# Patient Record
Sex: Female | Born: 1985
Health system: Southern US, Community
[De-identification: ages and names within clinical notes are randomized; demographics above are authoritative.]

## PROBLEM LIST (undated history)

## (undated) ENCOUNTER — Inpatient Hospital Stay (HOSPITAL_COMMUNITY): Payer: Self-pay

## (undated) DIAGNOSIS — K0889 Other specified disorders of teeth and supporting structures: Secondary | ICD-10-CM

## (undated) DIAGNOSIS — E039 Hypothyroidism, unspecified: Secondary | ICD-10-CM

## (undated) DIAGNOSIS — K219 Gastro-esophageal reflux disease without esophagitis: Secondary | ICD-10-CM

## (undated) DIAGNOSIS — I10 Essential (primary) hypertension: Secondary | ICD-10-CM

## (undated) DIAGNOSIS — Z803 Family history of malignant neoplasm of breast: Secondary | ICD-10-CM

## (undated) DIAGNOSIS — F419 Anxiety disorder, unspecified: Secondary | ICD-10-CM

## (undated) DIAGNOSIS — L02429 Furuncle of limb, unspecified: Secondary | ICD-10-CM

## (undated) DIAGNOSIS — D649 Anemia, unspecified: Secondary | ICD-10-CM

## (undated) DIAGNOSIS — Z8481 Family history of carrier of genetic disease: Secondary | ICD-10-CM

## (undated) DIAGNOSIS — R51 Headache: Secondary | ICD-10-CM

## (undated) DIAGNOSIS — R519 Headache, unspecified: Secondary | ICD-10-CM

## (undated) DIAGNOSIS — R87629 Unspecified abnormal cytological findings in specimens from vagina: Secondary | ICD-10-CM

## (undated) HISTORY — DX: Family history of malignant neoplasm of breast: Z80.3

## (undated) HISTORY — PX: WISDOM TOOTH EXTRACTION: SHX21

## (undated) HISTORY — DX: Anxiety disorder, unspecified: F41.9

## (undated) HISTORY — PX: CYST EXCISION: SHX5701

## (undated) HISTORY — DX: Furuncle of limb, unspecified: L02.429

## (undated) HISTORY — DX: Gastro-esophageal reflux disease without esophagitis: K21.9

## (undated) HISTORY — DX: Family history of carrier of genetic disease: Z84.81

## (undated) HISTORY — DX: Other specified disorders of teeth and supporting structures: K08.89

---
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
## 2006-06-23 ENCOUNTER — Emergency Department (HOSPITAL_COMMUNITY): Admission: EM | Admit: 2006-06-23 | Discharge: 2006-06-23 | Payer: Self-pay | Admitting: Family Medicine

## 2006-07-09 ENCOUNTER — Emergency Department (HOSPITAL_COMMUNITY): Admission: EM | Admit: 2006-07-09 | Discharge: 2006-07-09 | Payer: Self-pay | Admitting: Emergency Medicine

## 2006-07-26 ENCOUNTER — Emergency Department (HOSPITAL_COMMUNITY): Admission: EM | Admit: 2006-07-26 | Discharge: 2006-07-26 | Payer: Self-pay | Admitting: Emergency Medicine

## 2006-08-14 ENCOUNTER — Emergency Department (HOSPITAL_COMMUNITY): Admission: EM | Admit: 2006-08-14 | Discharge: 2006-08-14 | Payer: Self-pay | Admitting: Family Medicine

## 2006-08-27 ENCOUNTER — Ambulatory Visit (HOSPITAL_BASED_OUTPATIENT_CLINIC_OR_DEPARTMENT_OTHER): Admission: RE | Admit: 2006-08-27 | Discharge: 2006-08-27 | Payer: Self-pay | Admitting: General Surgery

## 2006-08-27 ENCOUNTER — Encounter (INDEPENDENT_AMBULATORY_CARE_PROVIDER_SITE_OTHER): Payer: Self-pay | Admitting: Specialist

## 2007-02-16 ENCOUNTER — Emergency Department (HOSPITAL_COMMUNITY): Admission: EM | Admit: 2007-02-16 | Discharge: 2007-02-16 | Payer: Self-pay | Admitting: Emergency Medicine

## 2007-06-17 ENCOUNTER — Emergency Department (HOSPITAL_COMMUNITY): Admission: EM | Admit: 2007-06-17 | Discharge: 2007-06-17 | Payer: Self-pay | Admitting: Emergency Medicine

## 2007-07-21 ENCOUNTER — Inpatient Hospital Stay (HOSPITAL_COMMUNITY): Admission: AD | Admit: 2007-07-21 | Discharge: 2007-07-21 | Payer: Self-pay | Admitting: Obstetrics & Gynecology

## 2007-08-12 ENCOUNTER — Inpatient Hospital Stay (HOSPITAL_COMMUNITY): Admission: AD | Admit: 2007-08-12 | Discharge: 2007-08-12 | Payer: Self-pay | Admitting: Obstetrics & Gynecology

## 2007-09-15 ENCOUNTER — Inpatient Hospital Stay (HOSPITAL_COMMUNITY): Admission: AD | Admit: 2007-09-15 | Discharge: 2007-09-15 | Payer: Self-pay | Admitting: Obstetrics & Gynecology

## 2007-11-28 ENCOUNTER — Inpatient Hospital Stay (HOSPITAL_COMMUNITY): Admission: AD | Admit: 2007-11-28 | Discharge: 2007-11-28 | Payer: Self-pay | Admitting: Obstetrics & Gynecology

## 2008-02-08 ENCOUNTER — Inpatient Hospital Stay (HOSPITAL_COMMUNITY): Admission: RE | Admit: 2008-02-08 | Discharge: 2008-02-11 | Payer: Self-pay | Admitting: Obstetrics

## 2008-02-08 ENCOUNTER — Encounter: Payer: Self-pay | Admitting: Obstetrics

## 2008-06-13 ENCOUNTER — Emergency Department (HOSPITAL_COMMUNITY): Admission: EM | Admit: 2008-06-13 | Discharge: 2008-06-13 | Payer: Self-pay | Admitting: Family Medicine

## 2008-06-25 ENCOUNTER — Emergency Department (HOSPITAL_COMMUNITY): Admission: EM | Admit: 2008-06-25 | Discharge: 2008-06-25 | Payer: Self-pay | Admitting: Emergency Medicine

## 2008-10-12 ENCOUNTER — Emergency Department (HOSPITAL_COMMUNITY): Admission: EM | Admit: 2008-10-12 | Discharge: 2008-10-12 | Payer: Self-pay | Admitting: Emergency Medicine

## 2008-12-07 ENCOUNTER — Emergency Department (HOSPITAL_COMMUNITY): Admission: EM | Admit: 2008-12-07 | Discharge: 2008-12-07 | Payer: Self-pay | Admitting: Emergency Medicine

## 2009-02-17 ENCOUNTER — Emergency Department (HOSPITAL_COMMUNITY): Admission: EM | Admit: 2009-02-17 | Discharge: 2009-02-17 | Payer: Self-pay | Admitting: Emergency Medicine

## 2009-03-12 ENCOUNTER — Emergency Department (HOSPITAL_COMMUNITY): Admission: EM | Admit: 2009-03-12 | Discharge: 2009-03-12 | Payer: Self-pay | Admitting: Emergency Medicine

## 2009-05-15 ENCOUNTER — Emergency Department (HOSPITAL_COMMUNITY): Admission: EM | Admit: 2009-05-15 | Discharge: 2009-05-15 | Payer: Self-pay | Admitting: Emergency Medicine

## 2009-07-26 ENCOUNTER — Emergency Department (HOSPITAL_COMMUNITY): Admission: EM | Admit: 2009-07-26 | Discharge: 2009-07-26 | Payer: Self-pay | Admitting: Family Medicine

## 2009-10-29 ENCOUNTER — Emergency Department (HOSPITAL_COMMUNITY): Admission: EM | Admit: 2009-10-29 | Discharge: 2009-10-29 | Payer: Self-pay | Admitting: Emergency Medicine

## 2010-03-14 ENCOUNTER — Ambulatory Visit (HOSPITAL_COMMUNITY): Admission: RE | Admit: 2010-03-14 | Discharge: 2010-03-14 | Payer: Self-pay | Admitting: Obstetrics

## 2010-04-20 ENCOUNTER — Emergency Department (HOSPITAL_COMMUNITY): Admission: EM | Admit: 2010-04-20 | Discharge: 2010-04-20 | Payer: Self-pay | Admitting: Emergency Medicine

## 2010-04-21 ENCOUNTER — Emergency Department (HOSPITAL_COMMUNITY): Admission: EM | Admit: 2010-04-21 | Discharge: 2010-04-21 | Payer: Self-pay | Admitting: Emergency Medicine

## 2010-04-24 ENCOUNTER — Emergency Department (HOSPITAL_COMMUNITY): Admission: EM | Admit: 2010-04-24 | Discharge: 2010-04-24 | Payer: Self-pay | Admitting: Surgery

## 2010-09-18 ENCOUNTER — Emergency Department (HOSPITAL_COMMUNITY)
Admission: EM | Admit: 2010-09-18 | Discharge: 2010-09-18 | Payer: Self-pay | Source: Home / Self Care | Admitting: Emergency Medicine

## 2010-10-01 ENCOUNTER — Emergency Department (HOSPITAL_COMMUNITY)
Admission: EM | Admit: 2010-10-01 | Discharge: 2010-10-01 | Payer: Self-pay | Source: Home / Self Care | Admitting: Emergency Medicine

## 2010-10-06 LAB — URINALYSIS, ROUTINE W REFLEX MICROSCOPIC
Ketones, ur: NEGATIVE mg/dL
Nitrite: NEGATIVE
Protein, ur: NEGATIVE mg/dL
Specific Gravity, Urine: 1.033 — ABNORMAL HIGH (ref 1.005–1.030)
Urine Glucose, Fasting: NEGATIVE mg/dL
Urobilinogen, UA: 1 mg/dL (ref 0.0–1.0)
pH: 6 (ref 5.0–8.0)

## 2010-10-06 LAB — URINE MICROSCOPIC-ADD ON

## 2010-10-06 LAB — RPR: RPR Ser Ql: NONREACTIVE

## 2010-10-06 LAB — POCT PREGNANCY, URINE: Preg Test, Ur: NEGATIVE

## 2010-10-06 LAB — WET PREP, GENITAL
Clue Cells Wet Prep HPF POC: NONE SEEN
Trich, Wet Prep: NONE SEEN
Yeast Wet Prep HPF POC: NONE SEEN

## 2010-10-06 LAB — GC/CHLAMYDIA PROBE AMP, GENITAL
Chlamydia, DNA Probe: NEGATIVE
GC Probe Amp, Genital: NEGATIVE

## 2010-12-24 LAB — POCT URINALYSIS DIP (DEVICE)
Bilirubin Urine: NEGATIVE
Glucose, UA: NEGATIVE mg/dL
Ketones, ur: NEGATIVE mg/dL
Nitrite: NEGATIVE
Protein, ur: NEGATIVE mg/dL
Specific Gravity, Urine: 1.01 (ref 1.005–1.030)
Urobilinogen, UA: 0.2 mg/dL (ref 0.0–1.0)
pH: 7 (ref 5.0–8.0)

## 2010-12-29 LAB — URINALYSIS, ROUTINE W REFLEX MICROSCOPIC
Bilirubin Urine: NEGATIVE
Glucose, UA: NEGATIVE mg/dL
Hgb urine dipstick: NEGATIVE
Ketones, ur: NEGATIVE mg/dL
Nitrite: NEGATIVE
Protein, ur: NEGATIVE mg/dL
Specific Gravity, Urine: 1.035 — ABNORMAL HIGH (ref 1.005–1.030)
Urobilinogen, UA: 1 mg/dL (ref 0.0–1.0)
pH: 6 (ref 5.0–8.0)

## 2010-12-29 LAB — URINE MICROSCOPIC-ADD ON

## 2010-12-29 LAB — POCT PREGNANCY, URINE: Preg Test, Ur: NEGATIVE

## 2011-01-01 LAB — POCT PREGNANCY, URINE: Preg Test, Ur: NEGATIVE

## 2011-01-05 LAB — STREP A DNA PROBE: Group A Strep Probe: NEGATIVE

## 2011-01-05 LAB — RAPID STREP SCREEN (MED CTR MEBANE ONLY): Streptococcus, Group A Screen (Direct): NEGATIVE

## 2011-02-03 NOTE — Op Note (Signed)
NAMEJOSSILYN, Marisa Yu               ACCOUNT NO.:  [id number]   MEDICAL RECORD NO.:  [id number]          PATIENT TYPE:  INP   LOCATION:  9133                          FACILITY:  [facility]   PHYSICIAN:  Charles A. Clearance Coots, M.D.DATE OF BIRTH:  Nov 18, 1985   DATE OF PROCEDURE:  02/08/2008  DATE OF DISCHARGE:                               OPERATIVE REPORT   PREOPERATIVE DIAGNOSIS:  A 40 weeks' gestation, previous cesarean  section, desires repeat cesarean section.   POSTOPERATIVE DIAGNOSIS:  A 40 weeks' gestation, previous cesarean  section, desires repeat cesarean section.   PROCEDURE:  Repeat low-transverse cesarean section.   SURGEON:  Brock Bad, MD   ASSISTANT:  Dian Queen, OR technician.   ANESTHESIA:  Spinal.   ESTIMATED BLOOD LOSS:  800 mL.   IV FLUIDS:  2800 mL.   URINE OUTPUT:  125 mL, clear.   COMPLICATIONS:  None.   DRAINS:  Foley to gravity.   FINDINGS:  Viable female at 1733, Apgars of 9 at 1 minute and 9 at 5  minutes, weight of 7 pounds and 15 ounces.  Normal uterus, ovaries, and  fallopian tubes.   OPERATION:  The patient was brought to the operating room; and after a  satisfactory spinal anesthesia, the abdomen was prepped and draped in  usual sterile fashion.  A Pfannenstiel skin incision was made through  the previous scar down to the fascia with the scalpel.  Fascia was  nicked in the midline, and the fascial incision was extended to the left  and to the right with curved Mayo scissors.  The superior and inferior  fascial edges were taken off the rectus muscles with blunt and sharp  dissection.  Rectus muscle was bluntly divided in the midline, and the  peritoneum was entered digitally and was digitally extended to the left  and to the right.  The Lexus retractor was then placed in the incision.  The vesicouterine fold of peritoneum, above the reflection of the  urinary bladder, was grasped with forceps and was incised and undermined  with Metzenbaum  scissors.  The incision was extended to the left and to  the right with the Metzenbaum scissors.  The bladder flap was bluntly  developed, and the uterus was then entered transversely in the lower  uterine segment with the scalpel down to the amniotic sac.  The uterine  incision was then extended to the left and to the right with the bandage  scissors.  The amniotic sac was ruptured.  The vertex was then brought  up into the incision, and the occiput was rotated into the incision, but  the vertex was noted to be hyperextended and delivery was difficult  without assistance.  The mighty vacuum extractor was placed on the  occiput.  The occiput was then flexed further into the incision and  delivered by vacuum extraction with the aid of fundal pressure from the  assistant.  The infant's mouth and nose were suctioned with a suction  bulb, and the delivery was completed with the aid of fundal pressure  from the assistant.  Umbilical cord was  doubly clamped and cut, and the  infant was handed off to the nursery staff.  Cord blood was obtained,  and the placenta was spontaneously expelled from the uterine cavity  intact.  The endometrial surface was then thoroughly debrided with a dry  lap sponge.  The edges of the uterine incision were grasped with a ring  forceps, and the uterus was closed with continuous interlocking suture  of 0 Monocryl from each corner to the center.  Hemostasis was excellent.  The pelvic cavity was then thoroughly irrigated with warm saline  solution, and the abdomen was then closed as follows.  Peritoneum was  closed with a continuous suture of 2-0 Monocryl.  The fascia was closed  with a continuous suture of 0 Vicryl.  Subcutaneous tissue was  thoroughly irrigated with warm saline solution, and all areas of  subcutaneous bleeding were coagulated with the Bovie.  Skin was then  closed with a surgical stainless steel staples.  A sterile bandage was  applied to the incision  closure.  Surgical technician indicated that all  needle, sponge, and instrument counts were correct x2.  The patient  tolerated the procedure well, was transported to the recovery room in  satisfactory condition.      Charles A. Clearance Coots, M.D.  Electronically Signed     CAH/MEDQ  D:  02/08/2008  T:  02/09/2008  Job:  [id number]

## 2011-02-03 NOTE — H&P (Signed)
Marisa Yu, CRONIC               ACCOUNT NO.:  [id number]   MEDICAL RECORD NO.:  [id number]          PATIENT TYPE:  INP   LOCATION:  9169                          [facility]ILITY[facility]  [facility]   PHYSICIAN:  Roseanna Rainbow, M.D.DATE OF BIRTH:  [birth year]   DATE OF ADMISSION:  02/08/2008  DATE OF DISCHARGE:                              HISTORY & PHYSICAL   CHIEF COMPLAINT:  The patient is a 25 year old para 1 with an estimated  date of confinement of Feb 07, 2008, with an intrauterine pregnancy at  40 plus weeks with a history of a previous cesarean delivery and  symptoms of a possible genital herpes outbreak.  At this point, she  declines a trial of labor.   HISTORY OF PRESENT ILLNESS:  Please see the above.  The patient was  initially scheduled for an induction of labor today.  She declines any  induction attempts at this time.   ALLERGIES:  No known drug allergies.   MEDICATIONS:  None.   RISK FACTORS:  History of a previous cesarean delivery and genital  herpes.   PRENATAL LABS:  Chlamydia probe negative.  Urine culture and sensitivity  insignificant growth.  GC probe negative.  One-hour GTT 94.  Hepatitis B  surface antigen negative.  Hematocrit 34.7, hemoglobin 10.9, HIV  nonreactive, and platelets 325,000.  Blood type is B positive, antibody  screen negative, RPR nonreactive, and rubella immune.  Sickle cell  negative.   PAST OBSTETRICAL HISTORY:  In February 2004, she was delivered of a live  born female via cesarean delivery, 7 pounds 16 ounces.   PAST GYN HISTORY:  There is a history of gonorrhea, syphilis, and  genital herpes.   PAST MEDICAL HISTORY:  She denies past surgical history.  Please see the  above.   SOCIAL HISTORY:  She is employed in [employer].  She is single, does  not give any significant history of alcohol usage, has no significant  smoking history.  Denies illicit drug use.   FAMILY HISTORY:  Diabetes mellitus.   PHYSICAL EXAM:  On  physical exam,  vital signs were stable.  She was  afebrile.  Fetal heart tracing reassuring.  Tocodynamometer: rare  uterine contractions.  Sterile vaginal exam per the RN.   ASSESSMENT:  Primipara at term, history of a previous cesarean delivery,  and declines trial of labor.   PLAN:  Admission, repeat cesarean delivery.      Roseanna Rainbow, M.D.  Electronically Signed     LAJ/MEDQ  D:  02/08/2008  T:  02/08/2008  Job:  [id number]

## 2011-02-06 NOTE — Discharge Summary (Signed)
Marisa Yu, Marisa Yu               ACCOUNT NO.:  [id number]   MEDICAL RECORD NO.:  [id number]          PATIENT TYPE:  INP   LOCATION:  9133                          [facility]   PHYSICIAN:  Charles A. Clearance Coots, M.D.DATE OF BIRTH:  1986/07/10   DATE OF ADMISSION:  02/08/2008  DATE OF DISCHARGE:  02/11/2008                               DISCHARGE SUMMARY   ADMITTING DIAGNOSES:  1. Gestation 40 weeks'.  2. Previous cesarean section, desires repeat cesarean section.   DISCHARGE DIAGNOSIS:  1. Gestation 40 weeks'.  2. Previous cesarean section, desires repeat cesarean section.  3. Status post repeat low transverse cesarean section on Feb 08, 2008.  4. Viable female delivered at 1733, Apgars of 9 at 1 minute, 9 at 5      minutes, weight of 7 pounds and 15 ounces.  Mother and infant      discharged home in good condition.   REASON FOR ADMISSION:  This is a 21-year para 1 with estimated date of  confinement of Feb 07, 2008, who presents for a repeat cesarean section.  The patient did have a history of genital herpes and was having symptoms  of a possible genital herpes outbreak and she declined trial of labor.   PAST MEDICAL HISTORY:  Surgery:  Cesarean section.  Illnesses:  None.   MEDICATIONS:  Prenatal vitamins and Valtrex.   ALLERGIES:  No known drug allergies.   SOCIAL HISTORY:  Single.  Negative tobacco, alcohol, or recreational  drug use.   FAMILY HISTORY:  Positive for diabetes.   REVIEW OF SYSTEMS:  Positive for vulvar itching and pain similar to the  type of symptoms with herpes outbreak in that particular area.   PHYSICAL EXAMINATION:  Well-nourished, well-developed female in no acute  distress.  Afebrile, vital signs are stable.  Lungs are clear to auscultation bilaterally.  HEART:  Regular rate and rhythm.  ABDOMEN:  Gravid, nontender.  Sterile vaginal exam did not reveal any herpes lesions.  Cervix was long  and closed.   ADMITTING LABORATORIES:  Hemoglobin  10, hematocrit 31, white blood cell  count 7000, and platelets 372,000.  RPR was nonreactive.   HOSPITAL COURSE:  The patient underwent a repeat low transverse cesarean  section on Feb 08, 2008.  There were no intraoperative complications.  Postoperative course was uncomplicated.  The patient was discharged home  on postop day #3 in good condition.   DISCHARGE LABORATORIES:  Hemoglobin 9.2, hematocrit 27.5, white blood  cell count 7400, and platelets 299,000.   DISCHARGE DISPOSITION:  Medications:  Continue prenatal vitamins.  Tylox  and ibuprofen was prescribed for pain.  Routine written instructions  were given for discharge after cesarean section.  The patient is to call  office for followup appointment in 2 weeks.      Charles A. Clearance Coots, M.D.  Electronically Signed     CAH/MEDQ  D:  03/07/2008  T:  03/07/2008  Job:  [id number]

## 2011-02-06 NOTE — Op Note (Signed)
NAME:  Marisa Yu, Marisa Yu                         ACCOUNT NO.:  [id number]   MEDICAL RECORD NO.:  [id number]                   PATIENT TYPE:  INP   LOCATION:  9138                                 [facility]   PHYSICIAN:  Roseanna Rainbow, M.D.         DATE OF BIRTH:  Dec 28, 1985   DATE OF PROCEDURE:  11/14/2002  DATE OF DISCHARGE:                                 OPERATIVE REPORT   PREOPERATIVE DIAGNOSES:  1. Intrauterine pregnancy at 40 and 4/7 weeks.  2. Protracted latent phase.  3. Chorioamnionitis.  4. Failed induction.   POSTOPERATIVE DIAGNOSES:  1. Intrauterine pregnancy at 40 and 4/7 weeks.  2. Protracted latent phase.  3. Chorioamnionitis.  4. Failed induction.   PROCEDURES:  Primary low transverse cesarean delivery via Pfannenstiel.   SURGEON:  Roseanna Rainbow, M.D.  Charles A. Clearance Coots, M.D.   ANESTHESIA:  Epidural.   COMPLICATIONS:  None.   ESTIMATED BLOOD LOSS:  600 cc.   FLUIDS REPLACED/URINE OUTPUT:  As per anesthesiology.   INDICATIONS FOR PROCEDURE:  The patient is a 25 year old para 0 at 24 and  3/7 weeks, induced for a nonreactive nonstress test with mild variable  decelerations.  Maximum dilatation was 4 cm.  The patient developed  chorioamnionitis.   FINDINGS:  Infant in cephalic presentation.  Neonatology present at  delivery.  Apgars 9 and 9, weight 7 pounds 6 ounces.  Normal uterus, tubes,  and ovaries.   PROCEDURE:  The patient was taken to the operating room where her epidural  anesthetic was found to be adequate.  She was then prepped and draped in the  usual sterile fashion in the dorsal supine position with a leftward tilt.  A  Pfannenstiel skin incision was then made with the scalpel and carried  through to the underlying layer of fascia with the Bovie.  The fascia was  then incised in the midline and the incision extended laterally with the  Mayo scissors.  The superior aspect of the fascial incision was then grasped  with  the Kocher clamps, elevated, and the underlying rectus muscles  dissected off.  Attention was then turned to the inferior aspect of this  incision which was manipulated in a similar fashion.  The rectus muscles  were then separated in the midline.  The peritoneum was entered.  The  peritoneal incision was then extended superiorly and inferiorly with good  visualization of the bladder.  The bladder blade was then inserted and the  vesicouterine peritoneum identified and grasped with pickups and entered  sharply with the Metzenbaum scissors.  This incision was then extended  laterally and the bladder flap created digitally.  The bladder blade was  then reinserted and the lower uterine segment incised in a transverse  fashion with the scalpel.  The uterine incision was then extended bluntly.  The bladder blade was removed and the infant's head delivered  atraumatically.  The nose and mouth were  suctioned with the bulb suction and  the cord was clamped and cut.  Of note a loose nuchal cord was observed.  The infant was handed off to the awaiting neonatologists.  The placenta was  then removed.  The uterus cleared of all clots and debris.  The uterine  incision was repaired with 0 Monocryl in a running lock fashion.  A second  layer of the same suture was used to obtained excellent hemostasis.  The  gutters were cleared of all clots.  The peritoneum was closed with 2-0  Vicryl.  The fascia was reapproximated with 0 Vicryl.  The skin was closed  with staples.  The patient tolerated the procedure well.  Sponge, lap, and  needle counts were correct x2.  The patient was taken to the PACU in stable  condition.                                               Roseanna Rainbow, M.D.    Judee Clara  D:  11/15/2002  T:  11/15/2002  Job:  [id number]

## 2011-02-06 NOTE — Discharge Summary (Signed)
NAME:  Marisa Yu, Marisa Yu                         ACCOUNT NO.:  [id number]   MEDICAL RECORD NO.:  [id number]                   PATIENT TYPE:  INP   LOCATION:  9138                                 [facility]   PHYSICIAN:  Charles A. Clearance Coots, M.D.             DATE OF BIRTH:  11-16-1985   DATE OF ADMISSION:  11/13/2002  DATE OF DISCHARGE:  11/17/2002                                 DISCHARGE SUMMARY   ADMISSION DIAGNOSES:  1. Forty and three-sevenths weeks gestation.  2. Nonreactive nonstress test with variable decelerations.  3. Induction of labor.   DISCHARGE DIAGNOSES:  1. Forty and three-sevenths weeks gestation.  2. Nonreactive nonstress test with variable decelerations.  3. Induction of labor.  4. Protracted latent phase of labor.  5. Chorioamnionitis.  6. Failed induction.  7. Delivered a viable female infant on November 14, 2002 at 1634; Apgars of 9     at one minute, 9 at five minutes; weight of 3350 grams; length of 49.5     cm.  Mother and baby discharged home in good condition.   REASON FOR ADMISSION:  A 25 year old G1 black female, estimated date of  confinement of November 11, 2002, presented to [facility] for  prenatal visit and on external fetal monitoring in the office for a  nonstress test at 40 weeks was noted to have a nonreactive NST and variable  fetal heart rate decelerations.  Being that she was 40 and three-sevenths  weeks gestation a decision was made to proceed with induction of labor.   PAST MEDICAL HISTORY:  1. Surgery:  None.  2. Illnesses:  None.   MEDICATIONS:  Prenatal vitamins.   ALLERGIES:  No known drug allergies.   SOCIAL HISTORY:  Single, adolescent.  Lives with grandparents.  Denies  recreational drug use but did have positive urine drug screen on July 2003  on her initial prenatal visit; was positive for marijuana.  She denies  tobacco or alcohol use.  She had late prenatal care with her initial  prenatal visit at [redacted] weeks  gestation.  Initial prenatal care was done at  [facility].   PHYSICAL EXAMINATION:  GENERAL:  Well-nourished, well-developed black female  in no acute distress.  VITAL SIGNS:  Afebrile and vital signs are stable.  HEENT:  Normal.  LUNGS:  Clear to auscultation bilaterally.  HEART:  Regular rate and rhythm.  ABDOMEN:  Gravid, soft, nontender.  EXTREMITIES:  Trace edema.  Deep tendon reflexes were 1+ bilaterally.  PELVIC:  Cervix on examination was 2 cm dilated, 70% effaced, and the vertex  was at a -3 station.   ADMISSION LABORATORY VALUES:  Hemoglobin 12.1; hematocrit 36.1; white blood  cell count 7900; platelets 302,000.  Comprehensive metabolic panel was  within normal limits.  RPR was nonreactive.   HOSPITAL COURSE:  The patient was admitted and on external fetal monitoring  was noted to have  irregular uterine contractions, and with favorable cervix  a decision was made to proceed with Pitocin augmentation and active rupture  of membranes, and active management of labor.  She progressed to 4 cm  dilatation by the following morning of November 14, 2002 with Pitocin at 7  milliunits/minute.  Intrauterine pressure catheter was placed and IV  antibiotics were started for prophylaxis for infection.  The patient made no  further progress over the next eight hours and a decision was made to  proceed with cesarean section delivery for failed induction.  The patient  was taken to the operating room and a primary low transverse cesarean  section was performed without complications.  Postoperative and postpartum  course was uncomplicated except for mild uterine tenderness as sequelae from  the chorioamnionitis that had developed from the protracted latent phase of  labor.  She was treated with IV Unasyn and responded quite well, and by  postoperative day #3 was nontender.  The patient was therefore discharged  home on postoperative day #3 in good condition.   DISCHARGE LABORATORY  VALUES:  Hemoglobin 11.3; hematocrit 32.4; white blood  cell count 12,300; platelets 243,000.   DISCHARGE DISPOSITION:  1. Medications:  Continue prenatal vitamins.  Tylox and ibuprofen were     prescribed as directed for pain.  Ortho Tri-Cyclen was prescribed for     contraception to be started two weeks after discharge from the hospital.  2. The patient was given routine written obstetrical discharge instructions     per booklet.  3. Will follow up at the [facility] in six weeks for checkup.                                               Charles A. Clearance Coots, M.D.    CAH/MEDQ  D:  11/17/2002  T:  11/17/2002  Job:  [id number]   cc:   Attn:  Dr. Coral Ceo [facility]

## 2011-02-06 NOTE — Op Note (Signed)
NAMENEJLA, REASOR               ACCOUNT NO.:  [id number]   MEDICAL RECORD NO.:  [id number]          PATIENT TYPE:  AMB   LOCATION:  DSC                          FACILITY:  [facility]   PHYSICIAN:  Gabrielle Dare. Janee Morn, M.D.DATE OF BIRTH:  07-04-1986   DATE OF PROCEDURE:  08/27/2006  DATE OF DISCHARGE:                               OPERATIVE REPORT   PREOPERATIVE DIAGNOSIS:  Cyst, right anterior scalp.   POSTOPERATIVE DIAGNOSIS:  Cyst, right anterior scalp.   PROCEDURE:  Excision of cyst, right anterior scalp.   SURGEON:  Violeta Gelinas.   ANESTHESIA:  MAC.   HISTORY OF PRESENT ILLNESS:  I evaluated Ms. Morozov earlier this week  for a cyst on her right anterior scalp.  Findings are consistent with an  epidermal inclusion cyst.  She presents for elective excision today.   PROCEDURE IN DETAIL:  Informed consent was obtained.  Her site was  marked.  She was brought to the operating room.  MAC anesthesia was  administered by the anesthesia staff.  Her scalp area was prepped and  draped in a sterile fashion.  A mixture of 1% lidocaine with epinephrine  and quarter percent Marcaine was injected over the cyst area and  surrounding tissue.  A transverse incision was made behind the hairline  subcutaneous.  Tissues were dissected down revealing the cyst.  The cyst  was circumferentially dissected out and completely removed and sent to  pathology.  Meticulous hemostasis was obtained with a Bovie cautery.  Wound was copiously irrigated.  After hemostasis was again ensured, the  subcutaneous tissues were approximated with an interrupted 3-0 Vicryl  suture, and the skin was closed with interrupted 3-0 Prolene simple  sutures.  Sponge, needle and instrument counts were correct.  Antibiotic  ointment was placed as a dressing.  The patient tolerated the procedure  well without apparent complication, was taken to the recovery in stable  condition.      Gabrielle Dare Janee Morn, M.D.  Electronically  Signed     BET/MEDQ  D:  08/27/2006  T:  08/28/2006  Job:  [id number]   cc:   Titus Dubin. Alwyn Ren, MD,FACP,FCCP

## 2011-05-26 ENCOUNTER — Emergency Department (HOSPITAL_COMMUNITY): Payer: Self-pay

## 2011-05-26 ENCOUNTER — Emergency Department (HOSPITAL_COMMUNITY)
Admission: EM | Admit: 2011-05-26 | Discharge: 2011-05-27 | Disposition: A | Discharge status: Home / Self Care | Payer: Self-pay | Attending: Emergency Medicine | Admitting: Emergency Medicine

## 2011-05-26 DIAGNOSIS — L0231 Cutaneous abscess of buttock: Secondary | ICD-10-CM | POA: Insufficient documentation

## 2011-05-26 DIAGNOSIS — R109 Unspecified abdominal pain: Secondary | ICD-10-CM | POA: Insufficient documentation

## 2011-05-26 DIAGNOSIS — N760 Acute vaginitis: Secondary | ICD-10-CM | POA: Insufficient documentation

## 2011-05-26 DIAGNOSIS — B9689 Other specified bacterial agents as the cause of diseases classified elsewhere: Secondary | ICD-10-CM | POA: Insufficient documentation

## 2011-05-26 DIAGNOSIS — N949 Unspecified condition associated with female genital organs and menstrual cycle: Secondary | ICD-10-CM | POA: Insufficient documentation

## 2011-05-26 DIAGNOSIS — L03317 Cellulitis of buttock: Secondary | ICD-10-CM | POA: Insufficient documentation

## 2011-05-26 DIAGNOSIS — E669 Obesity, unspecified: Secondary | ICD-10-CM | POA: Insufficient documentation

## 2011-05-26 DIAGNOSIS — A499 Bacterial infection, unspecified: Secondary | ICD-10-CM | POA: Insufficient documentation

## 2011-05-26 LAB — URINALYSIS, ROUTINE W REFLEX MICROSCOPIC
Glucose, UA: NEGATIVE mg/dL
Hgb urine dipstick: NEGATIVE
Ketones, ur: NEGATIVE mg/dL
Leukocytes, UA: NEGATIVE
Nitrite: NEGATIVE
Protein, ur: NEGATIVE mg/dL
Specific Gravity, Urine: 1.026 (ref 1.005–1.030)
Urobilinogen, UA: 1 mg/dL (ref 0.0–1.0)
pH: 6.5 (ref 5.0–8.0)

## 2011-05-26 LAB — WET PREP, GENITAL
Trich, Wet Prep: NONE SEEN
Yeast Wet Prep HPF POC: NONE SEEN

## 2011-05-26 LAB — POCT PREGNANCY, URINE: Preg Test, Ur: NEGATIVE

## 2011-06-06 ENCOUNTER — Emergency Department (HOSPITAL_COMMUNITY)
Admission: EM | Admit: 2011-06-06 | Discharge: 2011-06-06 | Discharge status: Against Medical Advice | Payer: Self-pay | Attending: Emergency Medicine | Admitting: Emergency Medicine

## 2011-06-06 DIAGNOSIS — R5383 Other fatigue: Secondary | ICD-10-CM | POA: Insufficient documentation

## 2011-06-06 DIAGNOSIS — R5381 Other malaise: Secondary | ICD-10-CM | POA: Insufficient documentation

## 2011-06-17 LAB — CBC
HCT: 27.5 — ABNORMAL LOW
HCT: 31.2 — ABNORMAL LOW
Hemoglobin: 10.4 — ABNORMAL LOW
MCHC: 33.2
MCHC: 33.4
MCV: 76.9 — ABNORMAL LOW
MCV: 77 — ABNORMAL LOW
Platelets: 299
RBC: 4.05
RDW: 15.3

## 2011-06-17 LAB — RPR: RPR Ser Ql: NONREACTIVE

## 2011-06-30 LAB — GC/CHLAMYDIA PROBE AMP, GENITAL
Chlamydia, DNA Probe: NEGATIVE
GC Probe Amp, Genital: NEGATIVE

## 2011-06-30 LAB — URINALYSIS, ROUTINE W REFLEX MICROSCOPIC
Glucose, UA: NEGATIVE
Hgb urine dipstick: NEGATIVE
Specific Gravity, Urine: >1.03 — ABNORMAL HIGH
pH: 6

## 2011-06-30 LAB — WET PREP, GENITAL: Trich, Wet Prep: NONE SEEN

## 2011-07-01 LAB — URINALYSIS, ROUTINE W REFLEX MICROSCOPIC
Bilirubin Urine: NEGATIVE
Glucose, UA: NEGATIVE
Hgb urine dipstick: NEGATIVE
Protein, ur: NEGATIVE
Urobilinogen, UA: 1

## 2011-07-02 LAB — URINALYSIS, ROUTINE W REFLEX MICROSCOPIC
Glucose, UA: NEGATIVE
Hgb urine dipstick: NEGATIVE
Protein, ur: NEGATIVE
pH: 6

## 2011-07-02 LAB — URINE MICROSCOPIC-ADD ON

## 2011-07-02 LAB — HCG, QUANTITATIVE, PREGNANCY: hCG, Beta Chain, Quant, S: 25845 — ABNORMAL HIGH

## 2012-12-02 ENCOUNTER — Telehealth: Payer: Self-pay | Admitting: Genetic Counselor

## 2012-12-02 NOTE — Telephone Encounter (Signed)
S/W PT IN REF TO NP GENETIC APPT 02/09/13@11 :00 REFERRAL DR Clearance Coots

## 2012-12-21 ENCOUNTER — Encounter (INDEPENDENT_AMBULATORY_CARE_PROVIDER_SITE_OTHER): Payer: Self-pay | Admitting: General Surgery

## 2012-12-21 ENCOUNTER — Ambulatory Visit (INDEPENDENT_AMBULATORY_CARE_PROVIDER_SITE_OTHER): Payer: BC Managed Care – PPO | Admitting: General Surgery

## 2012-12-21 ENCOUNTER — Ambulatory Visit (INDEPENDENT_AMBULATORY_CARE_PROVIDER_SITE_OTHER): Payer: Self-pay | Admitting: General Surgery

## 2012-12-21 VITALS — BP 120/82 | HR 97 | Temp 99.0°F | Resp 18 | Ht 65.0 in | Wt 245.0 lb

## 2012-12-21 DIAGNOSIS — L0591 Pilonidal cyst without abscess: Secondary | ICD-10-CM

## 2012-12-21 DIAGNOSIS — L988 Other specified disorders of the skin and subcutaneous tissue: Secondary | ICD-10-CM

## 2012-12-21 NOTE — Patient Instructions (Signed)
You have a tiny draining wound in the intergluteal cleft. There is no abscess or tenderness today. There is  no mass.  This is all consistent with a very localized pilonidal cyst that intermittently gets infected.  Since this has never been treated before, I strongly recommend that your first line of treatment is a course of antibiotics.  You have been given a prescription for Augmentin, and you are recommended to take this medicine twice a day for 10 days. The drainage should stop  and the area should heal up.  Return to see Dr. Derrell Lolling if the infection or drainage recurs. If this continues to be a problem you may need an operation.     Pilonidal Cyst A pilonidal cyst occurs when hairs get trapped (ingrown) beneath the skin in the crease between the buttocks over your sacrum (the bone under that crease). Pilonidal cysts are most common in young men with a lot of body hair. When the cyst is ruptured (breaks) or leaking, fluid from the cyst may cause burning and itching. If the cyst becomes infected, it causes a painful swelling filled with pus (abscess). The pus and trapped hairs need to be removed (often by lancing) so that the infection can heal. However, recurrence is common and an operation may be needed to remove the cyst. HOME CARE INSTRUCTIONS   If the cyst was NOT INFECTED:  Keep the area clean and dry. Bathe or shower daily. Wash the area well with a germ-killing soap. Warm tub baths may help prevent infection and help with drainage. Dry the area well with a towel.  Avoid tight clothing to keep area as moisture free as possible.  Keep area between buttocks as free of hair as possible. A depilatory may be used.  If the cyst WAS INFECTED and needed to be drained:  Your caregiver packed the wound with gauze to keep the wound open. This allows the wound to heal from the inside outwards and continue draining.  Return for a wound check in 1 day or as suggested.  If you take tub baths  or showers, repack the wound with gauze following them. Sponge baths (at the sink) are a good alternative.  If an antibiotic was ordered to fight the infection, take as directed.  Only take over-the-counter or prescription medicines for pain, discomfort, or fever as directed by your caregiver.  After the drain is removed, use sitz baths for 20 minutes 4 times per day. Clean the wound gently with mild unscented soap, pat dry, and then apply a dry dressing. SEEK MEDICAL CARE IF:   You have increased pain, swelling, redness, drainage, or bleeding from the area.  You have a fever.  You have muscles aches, dizziness, or a general ill feeling. Document Released: 09/04/2000 Document Revised: 11/30/2011 Document Reviewed: 11/02/2008 [facility] Patient Information 2013 [place], [place].

## 2012-12-21 NOTE — Progress Notes (Signed)
Patient ID: Marisa Yu, female   DOB: [birth year], 27 y.o.   MRN: [id number]  Chief Complaint  Patient presents with  . New Evaluation    pilonidal cyst    HPI Marisa Yu is a 27 y.o. female.  She is referred to me by Dr. Coral Ceo for about [place] and a pilonidal cyst.  The patient states that she has had intermittent problems for 2 years in the intergluteal cleft where she will develop tenderness and swelling and it  will then open up and drain and then resolve after 3-4 days. She says this happens around her menstrual period.   She has never been treated for this. She has never had an operation. She has never seen a physician. Apparently she was seen recently by Dr. Clearance Coots. She was given a prescription for doxycycline on March 13 but she has not filled the prescription and has not taken that medicine, stating that she is waiting for her pay check. She would like [place] to make this go away.   Currently she has a little bit of drainage but no pain  Comorbidities include only obesity  HPI  History reviewed. No pertinent past medical history.  Past Surgical History  Procedure Laterality Date  . Cesarean section  2004  . Cesarean section  2009  . Cyst excision      Family History  Problem Relation Age of Onset  . Cancer Maternal Aunt     Breast  . Cancer Maternal Grandmother     Lung  . Cancer Maternal Aunt     Cervical    Social History History  Substance Use Topics  . Smoking status: Former Smoker    Types: Cigarettes    Quit date: 09/22/2011  . Smokeless tobacco: Never Used  . Alcohol Use: No    No Known Allergies  No current outpatient prescriptions on file.   No current facility-administered medications for this visit.    Review of Systems Review of Systems  Constitutional: Negative for fever, chills and unexpected weight change.  HENT: Negative for hearing loss, congestion, sore throat, trouble swallowing and voice change.   Eyes: Negative for visual  disturbance.  Respiratory: Negative for cough and wheezing.   Cardiovascular: Negative for chest pain, palpitations and leg swelling.  Gastrointestinal: Negative for nausea, vomiting, abdominal pain, diarrhea, constipation, blood in stool, abdominal distention and anal bleeding.  Genitourinary: Negative for hematuria, vaginal bleeding and difficulty urinating.  Musculoskeletal: Negative for arthralgias.  Skin: Negative for rash and wound.  Neurological: Negative for seizures, syncope and headaches.  Hematological: Negative for adenopathy. Does not bruise/bleed easily.  Psychiatric/Behavioral: Negative for confusion.    Blood pressure 120/82, pulse 97, temperature 99 F (37.2 C), temperature source Temporal, resp. rate 18, height 5\' 5"  (1.651 m), weight 245 lb (111.131 kg).  Physical Exam Physical Exam  Constitutional: She is oriented to person, place, and time. She appears well-developed and well-nourished. No distress.  HENT:  Head: Normocephalic and atraumatic.  Eyes: Left eye exhibits no discharge. No scleral icterus.  Cardiovascular: Normal rate and regular rhythm.   Pulmonary/Chest: Effort normal. No respiratory distress.  Musculoskeletal: She exhibits no edema and no tenderness.  Neurological: She is alert and oriented to person, place, and time. She exhibits normal muscle tone. Coordination normal.  Skin: Skin is warm. No rash noted. She is not diaphoretic. No erythema. No pallor.  Small open sinus in the intergluteal cleft. Minimal serosanguineous fluid. Nontender. No cellulitis. No mass. I thoroughly probed  this about 1.5 cm.  Psychiatric: She has a normal mood and affect. Her behavior is normal. Judgment and thought content normal.    Data Reviewed Office note from Dr. Verdell Carmine office.  Assessment    Pilonidal cyst. History consistent with intermittent localized drainage and infection. No evidence of saline is, purulence, mass, or tenderness today     Plan    She  will be given a 10 day course of Augmentin.  If this heals nothing further will be done.  If this recurs, return to see me. She may need an operation if this continues to be a problem        Angelia Mould. Derrell Lolling, M.D., [facility] General and Minimally invasive Surgery Breast and Colorectal Surgery Office:   [phone] Pager:   [phone]  12/21/2012, 2:57 PM

## 2013-01-14 ENCOUNTER — Emergency Department (HOSPITAL_COMMUNITY): Payer: Self-pay

## 2013-01-14 ENCOUNTER — Encounter (HOSPITAL_COMMUNITY): Payer: Self-pay | Admitting: Physical Medicine and Rehabilitation

## 2013-01-14 ENCOUNTER — Emergency Department (HOSPITAL_COMMUNITY)
Admission: EM | Admit: 2013-01-14 | Discharge: 2013-01-14 | Disposition: A | Discharge status: Home / Self Care | Payer: Self-pay | Attending: Emergency Medicine | Admitting: Emergency Medicine

## 2013-01-14 DIAGNOSIS — X500XXA Overexertion from strenuous movement or load, initial encounter: Secondary | ICD-10-CM | POA: Insufficient documentation

## 2013-01-14 DIAGNOSIS — Y929 Unspecified place or not applicable: Secondary | ICD-10-CM | POA: Insufficient documentation

## 2013-01-14 DIAGNOSIS — Z87891 Personal history of nicotine dependence: Secondary | ICD-10-CM | POA: Insufficient documentation

## 2013-01-14 DIAGNOSIS — Y9302 Activity, running: Secondary | ICD-10-CM | POA: Insufficient documentation

## 2013-01-14 DIAGNOSIS — S93402A Sprain of unspecified ligament of left ankle, initial encounter: Secondary | ICD-10-CM

## 2013-01-14 DIAGNOSIS — S93409A Sprain of unspecified ligament of unspecified ankle, initial encounter: Secondary | ICD-10-CM | POA: Insufficient documentation

## 2013-01-14 MED ORDER — HYDROCODONE-ACETAMINOPHEN 5-325 MG PO TABS: 1.0000 | ORAL_TABLET | ORAL | Status: DC | PRN

## 2013-01-14 MED ORDER — IBUPROFEN 800 MG PO TABS: 800.0000 mg | ORAL_TABLET | Freq: Three times a day (TID) | ORAL | Status: DC

## 2013-01-14 NOTE — ED Notes (Signed)
Pt presents to department for evaluation of L ankle pain. States she "twisted" ankle yesterday while at work. 10/10 pain and swelling upon arrival to ED. Able to wiggle digits. Pt is alert and oriented x4.

## 2013-01-14 NOTE — Progress Notes (Signed)
Orthopedic Tech Progress Note Patient Details:  Marisa Yu [birth year] [id number]  Ortho Devices Type of Ortho Device: ASO;Crutches Ortho Device/Splint Location: left ankle Ortho Device/Splint Interventions: Application   Marisa Yu 01/14/2013, 2:05 PM

## 2013-01-14 NOTE — ED Provider Notes (Signed)
Medical screening examination/treatment/procedure(s) were performed by non-physician practitioner and as supervising physician I was immediately available for consultation/collaboration.   Romello Hoehn B. [place] Chelf, MD 01/14/13 1543 

## 2013-01-14 NOTE — ED Provider Notes (Signed)
History     CSN: [id number]  Arrival date & time 01/14/13  1153   First MD Initiated Contact with Patient 01/14/13 1315      Chief Complaint  Patient presents with  . Ankle Pain    (Consider location/radiation/quality/duration/timing/severity/associated sxs/prior treatment) Patient is a 27 y.o. female presenting with ankle pain. The history is provided by the patient.  Ankle Pain Location:  Ankle Time since incident:  1 day Injury: yes   Mechanism of injury: fall   Ankle location:  L ankle Pain details:    Quality:  Aching Associated symptoms: no fever   Associated symptoms comment:  She twisted her ankle yesterday while running causing pain and swelling.   No past medical history on file.  Past Surgical History  Procedure Laterality Date  . Cesarean section  2004  . Cesarean section  2009  . Cyst excision      Family History  Problem Relation Age of Onset  . Cancer Maternal Aunt     Breast  . Cancer Maternal Grandmother     Lung  . Cancer Maternal Aunt     Cervical    History  Substance Use Topics  . Smoking status: Former Smoker    Types: Cigarettes    Quit date: 09/22/2011  . Smokeless tobacco: Never Used  . Alcohol Use: No    OB History   Grav Para Term Preterm Abortions TAB SAB Ect Mult Living                  Review of Systems  Constitutional: Negative for fever and chills.  HENT: Negative.   Respiratory: Negative.   Cardiovascular: Negative.   Gastrointestinal: Negative.   Musculoskeletal:       See HPI.  Skin: Negative.   Neurological: Negative.     Allergies  Review of patient's allergies indicates no known allergies.  Home Medications  No current outpatient prescriptions on file.  BP 129/80  Pulse 81  Temp(Src) 97.9 F (36.6 C) (Oral)  Resp 18  SpO2 100%  LMP 12/31/2012  Physical Exam  Constitutional: She is oriented to person, place, and time. She appears well-developed and well-nourished.  Neck: Normal range of  motion.  Pulmonary/Chest: Effort normal.  Musculoskeletal:  Left ankle moderately swollen over lateral aspect. Joint stable. No discoloration or bony deformity.  Neurological: She is alert and oriented to person, place, and time.  Skin: Skin is warm and dry.  Psychiatric: She has a normal mood and affect.    ED Course  Procedures (including critical care time)  Labs Reviewed - No data to display Dg Ankle Complete Left  01/14/2013  *RADIOLOGY REPORT*  Clinical Data: Left ankle pain.  History of fall.  LEFT ANKLE COMPLETE - 3+ VIEW  Comparison: No priors.  Findings: Three views of the left ankle demonstrate marked soft tissue swelling overlying the lateral malleolus.  No evidence of underlying displaced fracture, subluxation or dislocation.  IMPRESSION: 1.  Soft tissue swelling overlying the lateral malleolus without evidence of acute bony trauma.   Original Report Authenticated By: Trudie Reed, M.D.      No diagnosis found.  1. Left ankle sprain  MDM  Uncomplicated ankle sprain        Arnoldo Hooker, PA-C 01/14/13 1348

## 2013-01-30 ENCOUNTER — Encounter: Payer: Self-pay | Admitting: Obstetrics

## 2013-01-30 ENCOUNTER — Ambulatory Visit (INDEPENDENT_AMBULATORY_CARE_PROVIDER_SITE_OTHER): Payer: BC Managed Care – PPO | Admitting: Obstetrics

## 2013-01-30 VITALS — BP 136/90 | HR 95 | Temp 97.8°F | Wt 250.0 lb

## 2013-01-30 DIAGNOSIS — N760 Acute vaginitis: Secondary | ICD-10-CM

## 2013-01-30 DIAGNOSIS — A499 Bacterial infection, unspecified: Secondary | ICD-10-CM

## 2013-01-30 DIAGNOSIS — Z113 Encounter for screening for infections with a predominantly sexual mode of transmission: Secondary | ICD-10-CM

## 2013-01-30 DIAGNOSIS — B9689 Other specified bacterial agents as the cause of diseases classified elsewhere: Secondary | ICD-10-CM

## 2013-01-30 DIAGNOSIS — L0233 Carbuncle of buttock: Secondary | ICD-10-CM

## 2013-01-30 DIAGNOSIS — N920 Excessive and frequent menstruation with regular cycle: Secondary | ICD-10-CM

## 2013-01-30 MED ORDER — METRONIDAZOLE 500 MG PO TABS: 500.0000 mg | ORAL_TABLET | Freq: Two times a day (BID) | ORAL | Status: DC

## 2013-01-30 MED ORDER — DOXYCYCLINE HYCLATE 50 MG PO CAPS: 100.0000 mg | ORAL_CAPSULE | Freq: Two times a day (BID) | ORAL | Status: DC

## 2013-01-30 NOTE — Progress Notes (Signed)
Subjective:     Marisa Yu is a 27 y.o. female here for problem visit.  Current complaints: abnormal cycles lasting months and boil on buttocks x 4 days.     Gynecologic History Patient's last menstrual period was 01/01/2013. Contraception: Nexplanon Last Pap: 11/2012. Results were: normal Last mammogram: n/a. Results were: n/a   The following portions of the patient's history were reviewed and updated as appropriate: allergies, current medications, past family history, past medical history, past social history, past surgical history and problem list.  Review of Systems Pertinent items are noted in HPI.    Objective:    General appearance: alert and no distress Abdomen: normal findings: soft, non-tender Pelvic: cervix normal in appearance, external genitalia normal, no adnexal masses or tenderness, no cervical motion tenderness, uterus normal size, shape, and consistency, vagina normal without discharge and carbuncle on buttocks    Assessment:    Healthy female exam.  Carbuncle on buttocks.   Plan:    Education reviewed: safe sex/STD prevention and management of crbuncles. Follow up in: 3 months. doxy/Flagyl Rx.

## 2013-01-31 LAB — WET PREP BY MOLECULAR PROBE: Candida species: NEGATIVE

## 2013-02-09 ENCOUNTER — Encounter: Payer: Self-pay | Admitting: Genetic Counselor

## 2013-02-09 ENCOUNTER — Other Ambulatory Visit: Payer: Self-pay

## 2013-02-20 ENCOUNTER — Encounter: Payer: Self-pay | Admitting: Obstetrics

## 2013-03-07 ENCOUNTER — Ambulatory Visit: Payer: BC Managed Care – PPO | Admitting: Obstetrics

## 2013-03-21 ENCOUNTER — Ambulatory Visit: Payer: BC Managed Care – PPO | Admitting: Obstetrics

## 2013-04-11 ENCOUNTER — Emergency Department (HOSPITAL_COMMUNITY)
Admission: EM | Admit: 2013-04-11 | Discharge: 2013-04-11 | Disposition: A | Discharge status: Home / Self Care | Payer: BC Managed Care – PPO | Attending: Emergency Medicine | Admitting: Emergency Medicine

## 2013-04-11 ENCOUNTER — Encounter (HOSPITAL_COMMUNITY): Payer: Self-pay | Admitting: Emergency Medicine

## 2013-04-11 DIAGNOSIS — T148XXA Other injury of unspecified body region, initial encounter: Secondary | ICD-10-CM

## 2013-04-11 DIAGNOSIS — Y92410 Unspecified street and highway as the place of occurrence of the external cause: Secondary | ICD-10-CM | POA: Insufficient documentation

## 2013-04-11 DIAGNOSIS — Y9389 Activity, other specified: Secondary | ICD-10-CM | POA: Insufficient documentation

## 2013-04-11 DIAGNOSIS — IMO0002 Reserved for concepts with insufficient information to code with codable children: Secondary | ICD-10-CM | POA: Insufficient documentation

## 2013-04-11 DIAGNOSIS — Z87891 Personal history of nicotine dependence: Secondary | ICD-10-CM | POA: Insufficient documentation

## 2013-04-11 MED ORDER — TRAMADOL HCL 50 MG PO TABS: 50.0000 mg | ORAL_TABLET | Freq: Four times a day (QID) | ORAL | Status: DC | PRN

## 2013-04-11 MED ORDER — IBUPROFEN 600 MG PO TABS: 600.0000 mg | ORAL_TABLET | Freq: Four times a day (QID) | ORAL | Status: DC | PRN

## 2013-04-11 MED ORDER — METHOCARBAMOL 500 MG PO TABS
500.0000 mg | ORAL_TABLET | Freq: Once | ORAL | Status: AC
  Administered 2013-04-11: 500 mg via ORAL
  Filled 2013-04-11: qty 1

## 2013-04-11 MED ORDER — IBUPROFEN 200 MG PO TABS
600.0000 mg | ORAL_TABLET | Freq: Once | ORAL | Status: AC
  Administered 2013-04-11: 600 mg via ORAL
  Filled 2013-04-11: qty 3

## 2013-04-11 MED ORDER — METHOCARBAMOL 500 MG PO TABS: 500.0000 mg | ORAL_TABLET | Freq: Two times a day (BID) | ORAL | Status: DC

## 2013-04-11 MED ORDER — TRAMADOL HCL 50 MG PO TABS
50.0000 mg | ORAL_TABLET | Freq: Once | ORAL | Status: AC
  Administered 2013-04-11: 50 mg via ORAL
  Filled 2013-04-11: qty 1
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
## 2013-04-14 ENCOUNTER — Emergency Department (HOSPITAL_COMMUNITY)
Admission: EM | Admit: 2013-04-14 | Discharge: 2013-04-14 | Disposition: A | Discharge status: Home / Self Care | Payer: BC Managed Care – PPO | Source: Home / Self Care | Attending: Emergency Medicine | Admitting: Emergency Medicine

## 2013-04-14 ENCOUNTER — Encounter (HOSPITAL_COMMUNITY): Payer: Self-pay | Admitting: Emergency Medicine

## 2013-04-14 ENCOUNTER — Other Ambulatory Visit (HOSPITAL_COMMUNITY)
Admission: RE | Admit: 2013-04-14 | Discharge: 2013-04-14 | Disposition: A | Discharge status: Home / Self Care | Payer: BC Managed Care – PPO | Source: Ambulatory Visit | Attending: Emergency Medicine | Admitting: Emergency Medicine

## 2013-04-14 DIAGNOSIS — Z7251 High risk heterosexual behavior: Secondary | ICD-10-CM

## 2013-04-14 DIAGNOSIS — N926 Irregular menstruation, unspecified: Secondary | ICD-10-CM

## 2013-04-14 DIAGNOSIS — N939 Abnormal uterine and vaginal bleeding, unspecified: Secondary | ICD-10-CM

## 2013-04-14 DIAGNOSIS — Z113 Encounter for screening for infections with a predominantly sexual mode of transmission: Secondary | ICD-10-CM | POA: Insufficient documentation

## 2013-04-14 DIAGNOSIS — N760 Acute vaginitis: Secondary | ICD-10-CM | POA: Insufficient documentation

## 2013-04-14 LAB — POCT PREGNANCY, URINE: Preg Test, Ur: NEGATIVE

## 2013-04-14 LAB — POCT URINALYSIS DIP (DEVICE)
Leukocytes, UA: NEGATIVE
Protein, ur: 30 mg/dL — AB
Urobilinogen, UA: 0.2 mg/dL (ref 0.0–1.0)

## 2013-04-14 NOTE — ED Provider Notes (Signed)
CSN: [id number]     Arrival date & time 04/14/13  1514 History     First MD Initiated Contact with Patient 04/14/13 1623     Chief Complaint  Patient presents with  . SEXUALLY TRANSMITTED DISEASE   (Consider location/radiation/quality/duration/timing/severity/associated sxs/prior Treatment) HPI Comments: Pt reports vaginal spotting since having unprotected intercourse on 7/20.  Hx very irregular periods with nexplanon implant, sometimes having a period for 21 days. Uncertain if spotting is related to std since had unprotected sex or if is related to implant. Denies any other sx.   Patient is a 27 y.o. female presenting with vaginal bleeding. The history is provided by the patient.  Vaginal Bleeding Quality:  Spotting Severity:  Mild Onset quality:  Sudden Duration:  5 days Timing:  Intermittent Progression:  Unchanged Chronicity:  New Menstrual history:  Irregular Possible pregnancy: no   Context: after intercourse   Relieved by:  None tried Worsened by:  Nothing tried Ineffective treatments:  None tried Associated symptoms: no abdominal pain, no back pain, no dyspareunia, no dysuria, no fever, no nausea and no vaginal discharge   Risk factors: new sexual partner, STD and unprotected sex     History reviewed. No pertinent past medical history. Past Surgical History  Procedure Laterality Date  . Cesarean section  2004  . Cesarean section  2009  . Cyst excision     Family History  Problem Relation Age of Onset  . Cancer Maternal Aunt     Breast  . Cancer Maternal Grandmother     Lung  . Cancer Maternal Aunt     Cervical   History  Substance Use Topics  . Smoking status: Former Smoker    Types: Cigarettes    Quit date: 09/22/2011  . Smokeless tobacco: Never Used  . Alcohol Use: No   OB History   Grav Para Term Preterm Abortions TAB SAB Ect Mult Living                 Review of Systems  Constitutional: Negative for fever and chills.  Gastrointestinal:  Negative for nausea, vomiting, abdominal pain and diarrhea.  Genitourinary: Positive for vaginal bleeding. Negative for dysuria, urgency, hematuria, flank pain, vaginal discharge, genital sores, vaginal pain and dyspareunia.  Musculoskeletal: Negative for back pain.    Allergies  Review of patient's allergies indicates no known allergies.  Home Medications   Current Outpatient Rx  Name  Route  Sig  Dispense  Refill  . etonogestrel (NEXPLANON) 68 MG IMPL implant   Subcutaneous   Inject 1 each into the skin once.         Marland Kitchen ibuprofen (ADVIL,MOTRIN) 600 MG tablet   Oral   Take 1 tablet (600 mg total) by mouth every 6 (six) hours as needed for pain.   30 tablet   0   . methocarbamol (ROBAXIN) 500 MG tablet   Oral   Take 1 tablet (500 mg total) by mouth 2 (two) times daily.   20 tablet   0   . traMADol (ULTRAM) 50 MG tablet   Oral   Take 1 tablet (50 mg total) by mouth every 6 (six) hours as needed for pain.   15 tablet   0    BP 130/81  Pulse 88  Temp(Src) 98.3 F (36.8 C) (Oral)  Resp 16  SpO2 100%  LMP 03/28/2013 Physical Exam  Constitutional: She appears well-developed and well-nourished. She does not appear ill. No distress.  obese  Cardiovascular: Normal rate and regular  rhythm.   Pulmonary/Chest: Effort normal and breath sounds normal.  Abdominal: Soft. Bowel sounds are normal. She exhibits no distension. There is no tenderness. There is no rebound and no guarding.  Genitourinary: Uterus normal. There is no rash, tenderness, lesion or injury on the right labia. There is no rash, tenderness, lesion or injury on the left labia. Cervix exhibits no motion tenderness, no discharge and no friability. Right adnexum displays no mass and no tenderness. Left adnexum displays no mass and no tenderness. There is bleeding around the vagina. No erythema or tenderness around the vagina. No foreign body around the vagina. No signs of injury around the vagina. No vaginal discharge  found.  Lymphadenopathy:       Right: No inguinal adenopathy present.       Left: No inguinal adenopathy present.    ED Course   Procedures (including critical care time)  Labs Reviewed  POCT URINALYSIS DIP (DEVICE) - Abnormal; Notable for the following:    Ketones, ur TRACE (*)    pH 8.5 (*)    Protein, ur 30 (*)    All other components within normal limits  HIV ANTIBODY (ROUTINE TESTING)  RPR  POCT PREGNANCY, URINE  CERVICOVAGINAL ANCILLARY ONLY   No results found. 1. Abnormal uterine bleeding   2. History of unprotected sex     MDM  Bleeding possibly related to sexual activity just prior to onset, also likely related to irregular bleeding pt has been having with implant. Pt to f/u with gyn.  Pt requests all std testing including hiv and rpr.  Knows will need to have tests repeated in about 3 months since it has only been less than one week since exposure.   Cathlyn Parsons, NP 04/14/13 [phone]

## 2013-04-14 NOTE — ED Notes (Signed)
Patient states she wants to make sure she does not have a STD

## 2013-04-14 NOTE — ED Provider Notes (Signed)
Medical screening examination/treatment/procedure(s) were performed by non-physician practitioner and as supervising physician I was immediately available for consultation/collaboration.  Leslee Home, M.D.  Reuben Likes, MD 04/14/13 [phone]

## 2013-04-15 LAB — HIV ANTIBODY (ROUTINE TESTING W REFLEX): HIV: NONREACTIVE

## 2013-04-15 LAB — RPR: RPR Ser Ql: NONREACTIVE

## 2013-04-18 NOTE — ED Notes (Addendum)
GC/Chlamydia neg., Affirm: Candida and Trich neg., Gardnerella pos.  Message sent to Dr. Lorenz Coaster. Marisa Yu 04/18/2013 Dr. Lorenz Coaster  wrote he did not think pt. needed treatment because she had no discharge or odor.

## 2013-05-09 ENCOUNTER — Encounter (INDEPENDENT_AMBULATORY_CARE_PROVIDER_SITE_OTHER): Payer: Self-pay

## 2013-05-09 NOTE — Progress Notes (Signed)
Received fax from [facility] on [address] requesting refill on Augmentin 875 mg tablets 1 tablet every 12 hours #20.  I asked Dr Derrell Lolling if the refill can be authorized and he denied.  She needs to be seen if having a flare up.  I faxed back to [facility] at [phone].

## 2013-05-11 ENCOUNTER — Ambulatory Visit: Payer: BC Managed Care – PPO | Admitting: Obstetrics

## 2013-05-25 ENCOUNTER — Ambulatory Visit: Payer: BC Managed Care – PPO | Admitting: Obstetrics

## 2013-06-05 ENCOUNTER — Ambulatory Visit (INDEPENDENT_AMBULATORY_CARE_PROVIDER_SITE_OTHER): Payer: BC Managed Care – PPO | Admitting: Obstetrics

## 2013-06-05 ENCOUNTER — Encounter: Payer: Self-pay | Admitting: Obstetrics

## 2013-06-05 ENCOUNTER — Encounter: Payer: Self-pay | Admitting: Obstetrics & Gynecology

## 2013-06-05 VITALS — Temp 97.8°F | Ht 66.0 in | Wt 246.6 lb

## 2013-06-05 DIAGNOSIS — Z3046 Encounter for surveillance of implantable subdermal contraceptive: Secondary | ICD-10-CM

## 2013-06-05 MED ORDER — NORETHIN ACE-ETH ESTRAD-FE 1-20 MG-MCG(24) PO TABS: 1.0000 | ORAL_TABLET | Freq: Every day | ORAL | Status: DC

## 2013-06-05 NOTE — Progress Notes (Signed)
  .  NEXPLANON REMOVAL NOTE  Date of LMP:   unknown  Contraception used: *Nexplanon   Indications:  The patient desires contraception.  She understands risks, benefits, and alternatives to Implanon and would like to proceed.  Anesthesia:   Lidocaine 1% plain.  Procedure:  A time-out was performed confirming the procedure and the patient's allergy status.  Complications: None                      The rod was palpated and the area was sterilely prepped.  The area beneath the distal tip was anesthetized with 1% xylocaine and the skin incised                       Over the tip and the tip was exposed, grasped with forcep and removed intact.  A single suture of 4-0 Vicryl was used to close incision.  Steri strip                       And a bandage applied and the arm was wrapped with gauze bandage.  The patient tolerated well.  Instructions:  The patient was instructed to remove the dressing in 24 hours and that some bruising is to be expected.  She was advised to use over the counter analgesics as needed for any pain at the site.  She is to keep the area dry for 24 hours and to call if her hand or arm becomes cold, numb, or blue.  Return visit:  Return in 6 weeks or prn

## 2013-06-23 ENCOUNTER — Encounter (HOSPITAL_COMMUNITY): Payer: Self-pay | Admitting: *Deleted

## 2013-06-23 ENCOUNTER — Emergency Department (HOSPITAL_COMMUNITY)
Admission: EM | Admit: 2013-06-23 | Discharge: 2013-06-23 | Disposition: A | Discharge status: Home / Self Care | Payer: BC Managed Care – PPO | Attending: Emergency Medicine | Admitting: Emergency Medicine

## 2013-06-23 DIAGNOSIS — H9209 Otalgia, unspecified ear: Secondary | ICD-10-CM | POA: Insufficient documentation

## 2013-06-23 DIAGNOSIS — Z87891 Personal history of nicotine dependence: Secondary | ICD-10-CM | POA: Insufficient documentation

## 2013-06-23 DIAGNOSIS — J020 Streptococcal pharyngitis: Secondary | ICD-10-CM | POA: Insufficient documentation

## 2013-06-23 DIAGNOSIS — Z79899 Other long term (current) drug therapy: Secondary | ICD-10-CM | POA: Insufficient documentation

## 2013-06-23 MED ORDER — PENICILLIN G BENZATHINE 1200000 UNIT/2ML IM SUSP
1.2000 10*6.[IU] | Freq: Once | INTRAMUSCULAR | Status: AC
  Administered 2013-06-23: 1.2 10*6.[IU] via INTRAMUSCULAR
  Filled 2013-06-23: qty 2

## 2013-06-23 NOTE — ED Provider Notes (Signed)
CSN: [id number]     Arrival date & time 06/23/13  1918 History  This chart was scribed for Marisa Morn, NP working with Darlys Gales, MD by Carl Best, ED Scribe. This patient was seen in room TR04C/TR04C and the patient's care was started at 8:10 PM.    Chief Complaint  Patient presents with  . Sore Throat    Patient is a 27 y.o. female presenting with pharyngitis. The history is provided by the patient. No language interpreter was used.  Sore Throat This is a new problem. The current episode started yesterday. The problem occurs constantly. The problem has not changed since onset.The symptoms are aggravated by swallowing. Nothing relieves the symptoms. She has tried nothing for the symptoms.   HPI Comments: Marisa Yu is a 27 y.o. female who presents to the Emergency Department complaining of a sore throat and ear ache that started at 10:00 PM last night.  She states that swallowing aggravates the pain.  The patient states that she was in contact with the coworker that had strep throat.    History reviewed. No pertinent past medical history. Past Surgical History  Procedure Laterality Date  . Cesarean section  2004  . Cesarean section  2009  . Cyst excision     Family History  Problem Relation Age of Onset  . Cancer Maternal Aunt     Breast  . Cancer Maternal Grandmother     Lung  . Cancer Maternal Aunt     Cervical   History  Substance Use Topics  . Smoking status: Former Smoker    Types: Cigarettes    Quit date: 09/22/2011  . Smokeless tobacco: Never Used  . Alcohol Use: No   OB History   Grav Para Term Preterm Abortions TAB SAB Ect Mult Living                 Review of Systems  HENT: Positive for ear pain and sore throat.   All other systems reviewed and are negative.    Allergies  Review of patient's allergies indicates no known allergies.  Home Medications   Current Outpatient Rx  Name  Route  Sig  Dispense  Refill  . ibuprofen (ADVIL,MOTRIN)  600 MG tablet   Oral   Take 1 tablet (600 mg total) by mouth every 6 (six) hours as needed for pain.   30 tablet   0   . methocarbamol (ROBAXIN) 500 MG tablet   Oral   Take 1 tablet (500 mg total) by mouth 2 (two) times daily.   20 tablet   0   . Norethindrone Acetate-Ethinyl Estrad-FE (LOESTRIN 24 FE) 1-20 MG-MCG(24) tablet   Oral   Take 1 tablet by mouth daily.   1 Package   11   . traMADol (ULTRAM) 50 MG tablet   Oral   Take 1 tablet (50 mg total) by mouth every 6 (six) hours as needed for pain.   15 tablet   0    Triage Vitals: BP 134/87  Pulse 87  Temp(Src) 98.4 F (36.9 C) (Oral)  Resp 16  SpO2 97%  LMP 06/23/2013  Physical Exam  Nursing note and vitals reviewed. Constitutional: She is oriented to person, place, and time. She appears well-developed and well-nourished. No distress.  HENT:  Head: Normocephalic and atraumatic.  Right Ear: External ear normal.  Left Ear: External ear normal.  Nose: Nose normal.  Enlarged erythematous tonsils with exudate.   Eyes: Conjunctivae are normal.  Neck: Neck  supple.  Pulmonary/Chest: Effort normal.  Lymphadenopathy:    She has cervical adenopathy.  Neurological: She is alert and oriented to person, place, and time.  Skin: Skin is warm and dry. She is not diaphoretic.  Psychiatric: She has a normal mood and affect.    ED Course  Procedures (including critical care time)  DIAGNOSTIC STUDIES: Oxygen Saturation is 97% on room air, normal by my interpretation.    COORDINATION OF CARE: 8:12 PM- Discussed the rapid strep screen results with the patient that were positive for strep throat.  Advised the patient to avoid spreading the infection to her children.     Labs Review Labs Reviewed  RAPID STREP SCREEN - Abnormal; Notable for the following:    Streptococcus, Group A Screen (Direct) POSITIVE (*)    All other components within normal limits   Imaging Review No results found. Strep screen positive.  Patient  opted for IM bicillin for treatment. MDM   1. Strep pharyngitis    I personally performed the services described in this documentation, which was scribed in my presence. The recorded information has been reviewed and is accurate.    Jimmye Norman, NP 06/24/13 0005

## 2013-06-23 NOTE — ED Notes (Signed)
Pt here for sore throat and ears, swollen nodes to neck area since 0200.

## 2013-06-23 NOTE — ED Notes (Signed)
The pt has had a sore throat and earache since 0200am today

## 2013-06-24 NOTE — ED Provider Notes (Signed)
Medical screening examination/treatment/procedure(s) were performed by non-physician practitioner and as supervising physician I was immediately available for consultation/collaboration.  Darlys Gales, MD 06/24/13 [phone]

## 2013-07-14 ENCOUNTER — Ambulatory Visit: Payer: BC Managed Care – PPO | Admitting: Advanced Practice Midwife

## 2013-09-16 ENCOUNTER — Other Ambulatory Visit (HOSPITAL_COMMUNITY)
Admission: RE | Admit: 2013-09-16 | Discharge: 2013-09-16 | Disposition: A | Discharge status: Home / Self Care | Payer: BC Managed Care – PPO | Source: Ambulatory Visit | Attending: Family Medicine | Admitting: Family Medicine

## 2013-09-16 ENCOUNTER — Emergency Department (HOSPITAL_COMMUNITY)
Admission: EM | Admit: 2013-09-16 | Discharge: 2013-09-16 | Disposition: A | Discharge status: Home / Self Care | Payer: BC Managed Care – PPO | Source: Home / Self Care | Attending: Family Medicine | Admitting: Family Medicine

## 2013-09-16 ENCOUNTER — Encounter (HOSPITAL_COMMUNITY): Payer: Self-pay | Admitting: Emergency Medicine

## 2013-09-16 DIAGNOSIS — N760 Acute vaginitis: Secondary | ICD-10-CM | POA: Insufficient documentation

## 2013-09-16 DIAGNOSIS — Z113 Encounter for screening for infections with a predominantly sexual mode of transmission: Secondary | ICD-10-CM | POA: Insufficient documentation

## 2013-09-16 DIAGNOSIS — N898 Other specified noninflammatory disorders of vagina: Secondary | ICD-10-CM

## 2013-09-16 DIAGNOSIS — R030 Elevated blood-pressure reading, without diagnosis of hypertension: Secondary | ICD-10-CM

## 2013-09-16 LAB — POCT URINALYSIS DIP (DEVICE)
Hgb urine dipstick: NEGATIVE
Ketones, ur: NEGATIVE mg/dL
Leukocytes, UA: NEGATIVE
Nitrite: NEGATIVE
Protein, ur: NEGATIVE mg/dL
pH: 5.5 (ref 5.0–8.0)

## 2013-09-16 LAB — GLUCOSE, CAPILLARY: Glucose-Capillary: 96 mg/dL (ref 70–99)

## 2013-09-16 NOTE — ED Provider Notes (Signed)
CSN: [id number]     Arrival date & time 09/16/13  4098 History   First MD Initiated Contact with Patient 09/16/13 1012     Chief Complaint  Patient presents with  . Exposure to STD   (Consider location/radiation/quality/duration/timing/severity/associated sxs/prior Treatment) Patient is a 27 y.o. female presenting with vaginal discharge. The history is provided by the patient.  Vaginal Discharge Quality:  Milky Severity:  Mild Onset quality:  Gradual Duration:  3 weeks Progression:  Unchanged Chronicity:  New Ineffective treatments:  None tried Associated symptoms: urinary frequency   Associated symptoms: no abdominal pain, no dyspareunia, no dysuria, no fever, no genital lesions, no nausea, no rash, no urinary hesitancy, no urinary incontinence, no vaginal itching and no vomiting   Risk factors: unprotected sex     History reviewed. No pertinent past medical history. Past Surgical History  Procedure Laterality Date  . Cesarean section  2004  . Cesarean section  2009  . Cyst excision     Family History  Problem Relation Age of Onset  . Cancer Maternal Aunt     Breast  . Cancer Maternal Grandmother     Lung  . Cancer Maternal Aunt     Cervical   History  Substance Use Topics  . Smoking status: Former Smoker    Types: Cigarettes    Quit date: 09/22/2011  . Smokeless tobacco: Never Used  . Alcohol Use: No   OB History   Grav Para Term Preterm Abortions TAB SAB Ect Mult Living                 Review of Systems  Constitutional: Negative for fever.  Gastrointestinal: Negative for nausea, vomiting and abdominal pain.  Genitourinary: Positive for vaginal discharge. Negative for bladder incontinence, dysuria, hesitancy and dyspareunia.  All other systems reviewed and are negative.    Allergies  Review of patient's allergies indicates no known allergies.  Home Medications   Current Outpatient Rx  Name  Route  Sig  Dispense  Refill  . Norethindrone  Acetate-Ethinyl Estrad-FE (LOESTRIN 24 FE) 1-20 MG-MCG(24) tablet   Oral   Take 1 tablet by mouth daily.   1 Package   11    BP 153/83  Pulse 76  Temp(Src) 98.8 F (37.1 C) (Oral)  Resp 18  SpO2 100%  LMP 08/20/2013 Physical Exam  Nursing note and vitals reviewed. Constitutional: She is oriented to person, place, and time. She appears well-developed and well-nourished. No distress.  HENT:  Head: Normocephalic and atraumatic.  Eyes: Conjunctivae are normal. Right eye exhibits no discharge. Left eye exhibits no discharge.  Neck: Normal range of motion. Neck supple.  Cardiovascular: Normal rate.   Pulmonary/Chest: Effort normal.  Abdominal: Soft. She exhibits no distension. There is no tenderness.  Genitourinary: Vagina normal and uterus normal. Pelvic exam was performed with patient supine. No labial fusion. There is no rash, tenderness, lesion or injury on the right labia. There is no rash, tenderness, lesion or injury on the left labia. Cervix exhibits no motion tenderness, no discharge and no friability. Right adnexum displays no mass, no tenderness and no fullness. Left adnexum displays no mass, no tenderness and no fullness.  Musculoskeletal: Normal range of motion.  Lymphadenopathy:       Right: No inguinal adenopathy present.       Left: No inguinal adenopathy present.  Neurological: She is alert and oriented to person, place, and time.  Skin: Skin is warm and dry. No rash noted.  Psychiatric: She has  a normal mood and affect. Her behavior is normal.    ED Course  Procedures (including critical care time) Labs Review Labs Reviewed  RPR  HIV ANTIBODY (ROUTINE TESTING)  POCT URINALYSIS DIP (DEVICE)  POCT PREGNANCY, URINE  CERVICOVAGINAL ANCILLARY ONLY   Imaging Review No results found.  EKG Interpretation    Date/Time:    Ventricular Rate:    PR Interval:    QRS Duration:   QT Interval:    QTC Calculation:   R Axis:     Text Interpretation:               MDM  Treatment options discussed with patient. She states she wishes to wait for lab/culture results to determine if treatment is needed. No clinical evidence of PID. UPT negative. CBG normal (checked for mention of urinary frequency)   Jess Barters [place], [place] 09/16/13 1123

## 2013-09-16 NOTE — ED Notes (Signed)
Pt c/o poss STD exposure and a creamy vag d/c onset 3 days Sxs also include: swelling and pain of the vagina Denies: f/v/n/d, abd/back pain, urinary sxs She is alert w/no signs of acute distress.

## 2013-09-16 NOTE — ED Provider Notes (Signed)
Medical screening examination/treatment/procedure(s) were performed by resident physician or non-physician practitioner and as supervising physician I was immediately available for consultation/collaboration.   Barkley Bruns MD.   Linna Hoff, MD 09/16/13 [phone]

## 2013-09-20 ENCOUNTER — Telehealth (HOSPITAL_COMMUNITY): Payer: Self-pay | Admitting: *Deleted

## 2013-09-20 ENCOUNTER — Telehealth (HOSPITAL_COMMUNITY): Payer: Self-pay | Admitting: Emergency Medicine

## 2013-09-20 MED ORDER — METRONIDAZOLE 500 MG PO TABS: 500.0000 mg | ORAL_TABLET | Freq: Two times a day (BID) | ORAL | Status: DC

## 2013-09-20 MED ORDER — FLUCONAZOLE 150 MG PO TABS: 150.0000 mg | ORAL_TABLET | Freq: Once | ORAL | Status: DC

## 2013-09-20 NOTE — ED Notes (Signed)
GC/Chlamydia neg., Affirm: Candida and Gardnerella pos., Trich neg., HIV/RPR non-reactive.  I called pt. Pt. verified x 2 and given results.  Pt. told she needs Diflucan for the yeast infection and Flagyl for the bacterial vaginosis. Pt. told where to pick up her Rx.'s.   Pt. instructed to no alcohol while taking the Flagyl.  Pt. asked for copies of her labs. Pt. told how to do that. Vassie Moselle 09/20/2013

## 2013-09-20 NOTE — ED Notes (Signed)
The patient's DNA probe came back positive for Gardnerella and Candida. She was not treated. We will send a prescription to her pharmacy, [facility] at [address] for metronidazole 500 mg, #14, 1 twice a day for a week, and Diflucan 150 mg, #1, 1 tablet at one time. We will call the patient back in a former this result.  Reuben Likes, MD 09/20/13 [phone]

## 2013-09-20 NOTE — ED Notes (Signed)
The patient's DNA probe came back positive for Gardnerella and Candida. She was not treated. We will send a prescription to her pharmacy, [facility] at [address] and [address] for metronidazole 500 mg, #14, 1 twice a day for a week, and Diflucan 150 mg, #1, 1 tablet at one time. We will call the patient back in a former this result.  Reuben Likes, MD 09/20/13 1604  Reuben Likes, MD 09/20/13 [phone]

## 2013-09-20 NOTE — Telephone Encounter (Signed)
Message copied by Reuben Likes on Wed Sep 20, 2013  4:03 PM ------      Message from: Vassie Moselle      Created: Wed Sep 20, 2013  3:06 PM      Regarding: labs       Gardnerella and Candida pos. No treatment noted.  Pt. of Narda Bonds PA.      Vassie Moselle      09/20/2013       ------

## 2014-01-09 ENCOUNTER — Emergency Department (HOSPITAL_COMMUNITY): Payer: No Typology Code available for payment source

## 2014-01-09 ENCOUNTER — Emergency Department (HOSPITAL_COMMUNITY)
Admission: EM | Admit: 2014-01-09 | Discharge: 2014-01-09 | Disposition: A | Discharge status: Home / Self Care | Payer: No Typology Code available for payment source | Attending: Emergency Medicine | Admitting: Emergency Medicine

## 2014-01-09 ENCOUNTER — Encounter (HOSPITAL_COMMUNITY): Payer: Self-pay | Admitting: Emergency Medicine

## 2014-01-09 DIAGNOSIS — Z79899 Other long term (current) drug therapy: Secondary | ICD-10-CM | POA: Insufficient documentation

## 2014-01-09 DIAGNOSIS — Z87891 Personal history of nicotine dependence: Secondary | ICD-10-CM | POA: Insufficient documentation

## 2014-01-09 DIAGNOSIS — M79605 Pain in left leg: Secondary | ICD-10-CM

## 2014-01-09 DIAGNOSIS — M79609 Pain in unspecified limb: Secondary | ICD-10-CM | POA: Insufficient documentation

## 2014-01-09 DIAGNOSIS — Z792 Long term (current) use of antibiotics: Secondary | ICD-10-CM | POA: Insufficient documentation

## 2014-01-09 MED ORDER — NAPROXEN 500 MG PO TABS: 500.0000 mg | ORAL_TABLET | Freq: Two times a day (BID) | ORAL | Status: DC

## 2014-01-09 NOTE — ED Notes (Signed)
Patient transported to X-ray 

## 2014-01-09 NOTE — Discharge Instructions (Signed)
- Follow-up tomorrow for ultrasound of your leg  - Take naprosyn for pain - take with food twice daily  - Use RICE method to help with symptoms - see below - Return to the emergency department if you develop any changing/worsening condition, spreading redness/swelling, difficulty breathing, chest pain, or any other concerns (please read additional information regarding your condition below)   RICE: Routine Care for Injuries The routine care of many injuries includes Rest, Ice, Compression, and Elevation (RICE). HOME CARE INSTRUCTIONS  Rest is needed to allow your body to heal. Routine activities can usually be resumed when comfortable. Injured tendons and bones can take up to 6 weeks to heal. Tendons are the cord-like structures that attach muscle to bone.  Ice following an injury helps keep the swelling down and reduces pain.  Put ice in a plastic bag.  Place a towel between your skin and the bag.  Leave the ice on for 15-20 minutes, 03-04 times a day. Do this while awake, for the first 24 to 48 hours. After that, continue as directed by your caregiver.  Compression helps keep swelling down. It also gives support and helps with discomfort. If an elastic bandage has been applied, it should be removed and reapplied every 3 to 4 hours. It should not be applied tightly, but firmly enough to keep swelling down. Watch fingers or toes for swelling, bluish discoloration, coldness, numbness, or excessive pain. If any of these problems occur, remove the bandage and reapply loosely. Contact your caregiver if these problems continue.  Elevation helps reduce swelling and decreases pain. With extremities, such as the arms, hands, legs, and feet, the injured area should be placed near or above the level of the heart, if possible. SEEK IMMEDIATE MEDICAL CARE IF:  You have persistent pain and swelling.  You develop redness, numbness, or unexpected weakness.  Your symptoms are getting worse rather than  improving after several days. These symptoms may indicate that further evaluation or further X-rays are needed. Sometimes, X-rays may not show a small broken bone (fracture) until 1 week or 10 days later. Make a follow-up appointment with your caregiver. Ask when your X-ray results will be ready. Make sure you get your X-ray results. Document Released: 12/20/2000 Document Revised: 11/30/2011 Document Reviewed: 02/06/2011 [facility] Patient Information 2014 [place], [place].   Emergency Department Resource Guide 1) Find a Doctor and Pay Out of Pocket Although you won't have to find out who is covered by your insurance plan, it is a good idea to ask around and get recommendations. You will then need to call the office and see if the doctor you have chosen will accept you as a new patient and what types of options they offer for patients who are self-pay. Some doctors offer discounts or will set up payment plans for their patients who do not have insurance, but you will need to ask so you aren't surprised when you get to your appointment.  2) Contact Your Local Health Department Not all health departments have doctors that can see patients for sick visits, but many do, so it is worth a call to see if yours does. If you don't know where your local health department is, you can check in your phone book. The CDC also has a tool to help you locate your state's health department, and many state websites also have listings of all of their local health departments.  3) Find a Walk-in Clinic If your illness is not likely to be very severe  or complicated, you may want to try a walk in clinic. These are popping up all over the country in pharmacies, drugstores, and shopping centers. They're usually staffed by nurse practitioners or physician assistants that have been trained to treat common illnesses and complaints. They're usually fairly quick and inexpensive. However, if you have serious medical issues or chronic  medical problems, these are probably not your best option.  No Primary Care Doctor: - Call Health Connect at  [phone] - they can help you locate a primary care doctor that  accepts your insurance, provides certain services, etc. - Physician Referral Service- [phone]  Chronic Pain Problems: Organization         Address  Phone   Notes  [facility]  [phone] Patients need to be referred by their primary care doctor.   Medication Assistance: Organization         Address  Phone   Notes  [facility] Medication [facility] [address] [place] [place], [place] [address] [phone] --Must be a resident of [facility] -- Must have NO insurance coverage whatsoever (no Medicaid/ Medicare, etc.) -- The pt. MUST have a primary care doctor that directs their care regularly and follows them in the community   MedAssist  [phone]   [employer]  [phone]    Agencies that provide inexpensive medical care: Organization         Address  Phone   Notes  [facility]  [phone]   [facility]    [phone]   [facility] [address] [place], [place] [address] [phone]   [facility] [address] [place], [place] [phone]   Planned Parenthood    [phone]   [facility]    [phone]   [facility]  [address], [place] Phone:  [phone], Fax:  [phone] Hours of Operation:  9 am - 6 pm, M-F.  Also accepts Medicaid/Medicare and self-pay.  [facility]  [address], [place] Phone: [phone], Fax: [phone]. Hours of Operation:  8:30 am - 5:30 pm, M-F.  Also accepts Medicaid and self-pay.  [facility] [place] [address] [place] Phone: [phone]   [facility] [address] Natasha Bence [place], [place] [phone],  Ext. [phone] Mondays & Thursdays: 7-9 AM.  First 15 patients are seen on a first come, first serve basis.    Medicaid-accepting [facility] Providers:  Organization         Address  Phone   Notes  [facility] [address],  [phone] Also accepts self-pay patients.  [facility] [address] Laurell Josephs [place], [place]  [phone]   [facility] [address], [place] [phone]   [facility] [address] [place] [phone]   Renaye Rakers [address], [place]   [phone] Only accepts [place] Access [place] patients after they have their name applied to their card.   Self-Pay (no insurance) in [facility]:  Organization         Address  Phone   Notes  Sickle Cell Patients, [facility] [address] [place], [place] [phone]   [facility] [address], [place] [phone]) [phone]   [facility]  [address],  [phone]   [facility]/Dr. Osei-Bonsu  [address] [place] [address], [place] or [phone], [place] [phone] Phone number for both [place] and [place] locations is the same.  [facility] [address], [place] [phone]   [facility] [place] [address] [place] [address], [place] or [address] [phone]   [facility] [address], [place] [phone]) [phone], phone; [phone], fax Sees patients 1st and 3rd Saturday of every month.  Must not qualify for public or private insurance (i.e. Medicaid, Medicare, [place] Health Choice, Veterans' Benefits)  Household income should be no more than 200% of the poverty level The clinic cannot treat you if you are pregnant or think you are pregnant  Sexually transmitted diseases are not  treated at the clinic.    Dental Care: Organization         Address  Phone  Notes  [facility] [address], [place] [phone] Accepts children up to age 28 who are enrolled in [place] or [place] Health Choice; pregnant women with a Medicaid card; and children who have applied for Medicaid or [place] Health Choice, but were declined, whose parents can pay a reduced fee at time of service.  [facility] [place]  [address], [place] [phone] Accepts children up to age 28 who are enrolled in [place] or [place] Health Choice; pregnant women with a Medicaid card; and children who have applied for Medicaid or  Health Choice, but were declined, whose parents can pay a reduced fee at time of service.  [facility]  [address], [place] [phone]) [phone] Patients are seen by appointment only. Walk-ins are not accepted. [facility] will see patients 28 years of age and older. Monday - Tuesday (8am-5pm) Most Wednesdays (8:30-5pm) $30 per visit, cash only  [facility]  [address], [place] [phone] Patients are seen by appointment only. Walk-ins are not accepted. [facility] will see patients 28 years of age and older. One Wednesday Evening (Monthly: Volunteer Based).  $30 per visit, cash only  [employer] [facility]  [phone] for adults; Children under age 374, call Graduate Pediatric Dentistry at [phone]. Children aged 364-14, please call [phone] to request a pediatric application.  Dental services are provided in all areas of dental care including fillings, crowns and bridges, complete and partial dentures, implants, gum treatment, root canals, and extractions. Preventive care is also provided. Treatment is provided to both adults and children. Patients are selected via a lottery and there is  often a waiting list.   [facility] [address], [place]  [phone]) [phone] [email or web address]   [facility] [address], [place], [place] [phone], Ext. [phone] Second and Fourth Thursday of each month, opens at 6:30 AM; Clinic ends at 9 AM.  Patients are seen on a first-come first-served basis, and a limited number are seen during each clinic.   [facility]  [address], [place], [place] [phone]   Eligibility Requirements You must have lived in [place], [place], or [place] counties for at least the last three months.   You cannot  be eligible for state or federal sponsored [place], including [employer], [place], or [employer].   You generally cannot be eligible for healthcare insurance through your employer.    How to apply: Eligibility screenings are held every Tuesday and Wednesday afternoon from 1:00 pm until 4:00 pm. You do not need an appointment for the interview!  [facility] [address], [place], [place] [phone]   [facility] Health Department  [phone]   [facility] Health [facility]  [phone]   [facility] Health Department  [phone]    Behavioral Health Resources in the Community: Intensive Outpatient Programs Organization         Address  Phone  Notes  [facility] [address], [place], [place] [phone]   [facility] [address] [place], [place] [phone]   [facility] [address], [place], [place]  [phone]   [facility] Mental Health [address],  [place], [place] [phone] or [phone]   Substance Abuse Resources Organization         Address  Phone  Notes  [facility] Drug Services  [phone]   [facility]  [phone]   [facility]  [phone]   Floydene Flock  [phone]   Residential & Outpatient Substance  Abuse Program  [phone]   Psychological Services Organization         Address  Phone  Notes  [facility] Behavioral Health  [phone]   [facility]  [phone]   [facility] [address] [place] [phone] or [phone]    Mobile Crisis Teams Organization         Address  Phone  Notes  [facility], Mobile Crisis Care Unit  [phone]   [facility]  [address] [place], [place] [phone]   Doristine Locks [address] [place] [phone]    Self-Help/Support Groups Organization         Address  Phone             Notes  [facility]oc[facility] of [place] - variety of support groups  [phone]- [phone] Call for more information  Narcotics Anonymous (NA), [facility] [address], [employer] [place]  2 meetings at this location   Statistician         Address  Phone  Notes  [facility] [address],    [place]  [phone]   [facility]  [address] [place] [address], [place], [place] [phone]   [facility] [address] [place], [place] [phone] Admissions: 8am-3pm M-F  [facility] [address]    [place], [place] [phone]   [facility] [address] [place], [place], [place] [phone]   [facility] [address],  [place], [place] [phone]   [facility] - Intensive Outpatient [address] Laurell Josephs [address], [place], [place] [phone]   [facility] ([facility]iction [facility] [address]  [place], [place] [phone] or [phone]   [facility] (RTS) [address] [place], [place] [phone] Accepts Medicaid  [facility] [address],  [place] [phone] Substance Abuse/Addiction Treatment   [facility] Organization          Address  Phone  Notes  [facility]  [phone]   Angie Fava, PhD [address] [place], [place]   [phone]) [phone] or [phone]   [facility]   [address] [place], [place] [phone]   [facility] [address], [place], [place] [phone]) [phone] Insurance/Medicaid/sponsorship through [facility]  [facility] Families [address]                                    [place], [place] [phone]) [phone] Therapy/tele-psych/case  [facility] [address]   [place], [place] [phone]    Dr. Lolly MustacheArfeen  [phone](336) [phone]   [facility]  [employer] [facility] 1) [address], [place] 2) [address], [place] 3)  [address], [place] [phone]  [phone]) [phone]   [facility] Child Abuse Hotline [phone]) [phone] or [phone]) [phone] (After Hours)

## 2014-01-09 NOTE — ED Notes (Signed)
Onset last night groin pain radiates left inside leg around to upper calf.  No injury, chest pain, shortness of breath or any other s/s noted.

## 2014-01-09 NOTE — ED Provider Notes (Signed)
CSN: [id number]     Arrival date & time 01/09/14  [id number] History  This chart was scribed for non-physician practitioner, Coral CeoJessica Javante Nilsson, PA-C, working with Gavin PoundMichael Y. Oletta LamasGhim, MD by Smiley HousemanFallon Davis, ED Scribe. This patient was seen in room TR08C/TR08C and the patient's care was started at 8:35 PM.  Chief Complaint  Patient presents with  . Leg Pain   The history is provided by the patient. No language interpreter was used.   HPI Comments: Joellyn Quailsshley M Breth is a 28 y.o. female who presents to the Emergency Department complaining of constant worsening left leg pain that started yesterday. Pt states the pain is primarily located behind her left knee and radiates towards her inner groin and calf. Pt denies any associated swelling. Pt states the pain is worse with movement and ambulation. She denies any known trauma or injury to her left leg. She denies numbness and tingling, weakness or loss of sensation in her left leg. Pt states she took Tylenol earlier today without relief. No history of DVT, PE, clotting disorder, cancer, recent surgery, immobilization, travel. Pt states she takes Loestrin daily. She states she is otherwise healthy.     History reviewed. No pertinent past medical history. Past Surgical History  Procedure Laterality Date  . Cesarean section  2004  . Cesarean section  2009  . Cyst excision     Family History  Problem Relation Age of Onset  . Cancer Maternal Aunt     Breast  . Cancer Maternal Grandmother     Lung  . Cancer Maternal Aunt     Cervical   History  Substance Use Topics  . Smoking status: Former Smoker    Types: Cigarettes    Quit date: 09/22/2011  . Smokeless tobacco: Never Used  . Alcohol Use: No   OB History   Grav Para Term Preterm Abortions TAB SAB Ect Mult Living                 Review of Systems  Constitutional: Negative for fever and chills.  Respiratory: Negative for shortness of breath.   Cardiovascular: Negative for chest pain and leg swelling.   Musculoskeletal: Negative for back pain and joint swelling.       Leg leg pain.  Skin: Negative for color change and wound.  Neurological: Negative for weakness, numbness and headaches.  All other systems reviewed and are negative.   Allergies  Review of patient's allergies indicates no known allergies.  Home Medications   Prior to Admission medications   Medication Sig Start Date End Date Taking? Authorizing Provider  fluconazole (DIFLUCAN) 150 MG tablet Take 1 tablet (150 mg total) by mouth once. 09/20/13   Reuben Likesavid C Keller, MD  metroNIDAZOLE (FLAGYL) 500 MG tablet Take 1 tablet (500 mg total) by mouth 2 (two) times daily. 09/20/13   Reuben Likesavid C Keller, MD  Norethindrone Acetate-Ethinyl Estrad-FE (LOESTRIN 24 FE) 1-20 MG-MCG(24) tablet Take 1 tablet by mouth daily. 06/05/13   Antionette CharLisa Jackson-Moore, MD   Triage Vitals: BP 115/71  Pulse 84  Temp(Src) 98.2 F (36.8 C) (Oral)  Resp 20  SpO2 96%  LMP 12/26/2013  Filed Vitals:   01/09/14 1903 01/09/14 2144  BP: 115/71 132/83  Pulse: 84 79  Temp: 98.2 F (36.8 C)   TempSrc: Oral   Resp: 20 20  SpO2: 96% 100%    Physical Exam  Nursing note and vitals reviewed. Constitutional: She is oriented to person, place, and time. She appears well-developed and well-nourished. No distress.  HENT:  Head: Normocephalic and atraumatic.  Right Ear: External ear normal.  Left Ear: External ear normal.  Nose: Nose normal.  Mouth/Throat: Oropharynx is clear and moist.  Eyes: Conjunctivae are normal. Right eye exhibits no discharge. Left eye exhibits no discharge.  Neck: Normal range of motion. Neck supple.  Cardiovascular: Normal rate.   Dorsalis pedis pulses present and equal bilaterally  Pulmonary/Chest: Effort normal.  Abdominal: Soft.  Musculoskeletal: Normal range of motion. She exhibits tenderness. She exhibits no edema.       Legs: Tenderness to palpation to the left posterior knee, medial thigh, and calf. Pain increased with ROM of the  left knee. No edema throughout the left leg. No knee effusion, edema or erythema. Negative straight leg raise. No lumbar tenderness. Patient able to ambulate without difficulty or ataxia  Neurological: She is alert and oriented to person, place, and time.  Sensation intact  Skin: Skin is warm and dry. She is not diaphoretic.     ED Course  Procedures (including critical care time) DIAGNOSTIC STUDIES: Oxygen Saturation is 96% on RA, adequate by my interpretation.    COORDINATION OF CARE: 8:41 PM-Will order x-ray of left knee.  Patient informed of current plan of treatment and evaluation and agrees with plan.    Labs Review Labs Reviewed - No data to display  Imaging Review Dg Knee Complete 4 Views Left  01/09/2014   CLINICAL DATA:  Left posterior knee pain.  No known injury.  EXAM: LEFT KNEE - COMPLETE 4+ VIEW  COMPARISON:  None.  FINDINGS: There is no evidence of fracture, dislocation, or joint effusion. There is no evidence of arthropathy or other focal bone abnormality. Soft tissues are unremarkable.  IMPRESSION: Negative.   Electronically Signed   By: Burman NievesWilliam  Stevens M.D.   On: 01/09/2014 21:05    MDM   Joellyn Quailsshley M Reichenberger is a 28 y.o. female who presents to the Emergency Department complaining of constant worsening left leg pain that started yesterday. Etiology of leg pain possibly due to knee pain with radiation to the thigh and calf. X-rays negative for fracture or malalignment. No evidence of a joint effusion or infection. Low suspicion for DVT. No leg edema or erythema. Patient's risk factors include estrogen supplementation. Will order US of the LE to be done tomorrow. Will not anti-coagulate at this time. Patient neurovascularly intact. Instructed patient to follow-up with PCP. RICE method discussed. Return precautions, discharge instructions, and follow-up was discussed with the patient before discharge.     Discharge Medication List as of 01/09/2014  9:43 PM    START taking  these medications   Details  naproxen (NAPROSYN) 500 MG tablet Take 1 tablet (500 mg total) by mouth 2 (two) times daily with a meal., Starting 01/09/2014, Until Discontinued, Print        Final impressions: 1. Leg pain, left       Luiz IronJessica Katlin Wynonna Fitzhenry PA-C   This patient was discussed with Dr. Jyl HeinzGhim         Gissela Bloch K Ebonie Westerlund, PA-C 01/12/14 [phone] [id number]

## 2014-01-09 NOTE — ED Notes (Signed)
Pt reports L leg pain that started yesterday.  Pt denies injury/trauma.  Pt states pain starts in groin and goes down.  Pt ambulatory.

## 2014-01-09 NOTE — ED Notes (Signed)
Back from xray

## 2014-01-10 ENCOUNTER — Ambulatory Visit (HOSPITAL_COMMUNITY)
Admission: RE | Admit: 2014-01-10 | Discharge: 2014-01-10 | Disposition: A | Discharge status: Home / Self Care | Payer: No Typology Code available for payment source | Source: Ambulatory Visit | Attending: Emergency Medicine | Admitting: Emergency Medicine

## 2014-01-10 DIAGNOSIS — M79609 Pain in unspecified limb: Secondary | ICD-10-CM

## 2014-01-10 NOTE — Progress Notes (Signed)
Left lower extremity venous duplex completed.  Left:  No evidence of DVT, superficial thrombosis, or Baker's cyst.  Right:  Negative for DVT in the common femoral vein.  

## 2014-01-13 NOTE — ED Provider Notes (Signed)
Medical screening examination/treatment/procedure(s) were performed by non-physician practitioner and as supervising physician I was immediately available for consultation/collaboration.  Lerae Langham Y. Emmanuelle Coxe, MD 01/13/14 1417 

## 2014-09-25 ENCOUNTER — Telehealth: Payer: Self-pay | Admitting: Obstetrics

## 2014-09-26 ENCOUNTER — Other Ambulatory Visit: Payer: Self-pay | Admitting: *Deleted

## 2014-09-26 DIAGNOSIS — Z3041 Encounter for surveillance of contraceptive pills: Secondary | ICD-10-CM

## 2014-09-26 MED ORDER — NORETHIN ACE-ETH ESTRAD-FE 1-20 MG-MCG(24) PO TABS: 1.0000 | ORAL_TABLET | Freq: Every day | ORAL | Status: DC

## 2014-09-26 NOTE — Telephone Encounter (Signed)
[id number] - oatient scheduled AEX . Rx refilled. brm

## 2014-09-27 ENCOUNTER — Telehealth: Payer: Self-pay | Admitting: *Deleted

## 2014-09-27 DIAGNOSIS — Z3041 Encounter for surveillance of contraceptive pills: Secondary | ICD-10-CM

## 2014-09-27 MED ORDER — NORGESTIM-ETH ESTRAD TRIPHASIC 0.18/0.215/0.25 MG-25 MCG PO TABS: 1.0000 | ORAL_TABLET | Freq: Every day | ORAL | Status: DC

## 2014-09-27 NOTE — Telephone Encounter (Signed)
Patient states her birth control is too expensive with her new insurance-[employer] and she is using the generic. 2:15 Offered patient to change and use generic from [facility] [employer]. Patient wants to do that. Ok to change per Dr Clearance Cootsharper to [facility] generic. [facility].

## 2014-12-19 ENCOUNTER — Encounter (HOSPITAL_COMMUNITY): Payer: Self-pay | Admitting: *Deleted

## 2014-12-19 ENCOUNTER — Emergency Department (HOSPITAL_COMMUNITY): Payer: 59

## 2014-12-19 ENCOUNTER — Emergency Department (HOSPITAL_COMMUNITY)
Admission: EM | Admit: 2014-12-19 | Discharge: 2014-12-19 | Disposition: A | Discharge status: Home / Self Care | Payer: 59 | Attending: Emergency Medicine | Admitting: Emergency Medicine

## 2014-12-19 DIAGNOSIS — Z793 Long term (current) use of hormonal contraceptives: Secondary | ICD-10-CM | POA: Diagnosis not present

## 2014-12-19 DIAGNOSIS — Z331 Pregnant state, incidental: Secondary | ICD-10-CM | POA: Insufficient documentation

## 2014-12-19 DIAGNOSIS — Z3490 Encounter for supervision of normal pregnancy, unspecified, unspecified trimester: Secondary | ICD-10-CM

## 2014-12-19 DIAGNOSIS — Z791 Long term (current) use of non-steroidal anti-inflammatories (NSAID): Secondary | ICD-10-CM | POA: Diagnosis not present

## 2014-12-19 DIAGNOSIS — N898 Other specified noninflammatory disorders of vagina: Secondary | ICD-10-CM | POA: Insufficient documentation

## 2014-12-19 DIAGNOSIS — R197 Diarrhea, unspecified: Secondary | ICD-10-CM | POA: Insufficient documentation

## 2014-12-19 DIAGNOSIS — R112 Nausea with vomiting, unspecified: Secondary | ICD-10-CM

## 2014-12-19 DIAGNOSIS — R1110 Vomiting, unspecified: Secondary | ICD-10-CM

## 2014-12-19 DIAGNOSIS — R1012 Left upper quadrant pain: Secondary | ICD-10-CM | POA: Diagnosis not present

## 2014-12-19 DIAGNOSIS — R109 Unspecified abdominal pain: Secondary | ICD-10-CM

## 2014-12-19 DIAGNOSIS — Z87891 Personal history of nicotine dependence: Secondary | ICD-10-CM | POA: Diagnosis not present

## 2014-12-19 DIAGNOSIS — R1030 Lower abdominal pain, unspecified: Secondary | ICD-10-CM

## 2014-12-19 LAB — URINALYSIS, ROUTINE W REFLEX MICROSCOPIC
Bilirubin Urine: NEGATIVE
Glucose, UA: NEGATIVE mg/dL
Hgb urine dipstick: NEGATIVE
KETONES UR: NEGATIVE mg/dL
LEUKOCYTES UA: NEGATIVE
Nitrite: NEGATIVE
PROTEIN: NEGATIVE mg/dL
SPECIFIC GRAVITY, URINE: 1.023 (ref 1.005–1.030)
Urobilinogen, UA: 0.2 mg/dL (ref 0.0–1.0)
pH: 6 (ref 5.0–8.0)

## 2014-12-19 LAB — CBC WITH DIFFERENTIAL/PLATELET
BASOS ABS: 0 10*3/uL (ref 0.0–0.1)
BASOS PCT: 0 % (ref 0–1)
EOS ABS: 0.1 10*3/uL (ref 0.0–0.7)
Eosinophils Relative: 2 % (ref 0–5)
HCT: 37.2 % (ref 36.0–46.0)
Hemoglobin: 11.5 g/dL — ABNORMAL LOW (ref 12.0–15.0)
LYMPHS ABS: 0.7 10*3/uL (ref 0.7–4.0)
Lymphocytes Relative: 13 % (ref 12–46)
MCH: 24.3 pg — AB (ref 26.0–34.0)
MCHC: 30.9 g/dL (ref 30.0–36.0)
MCV: 78.5 fL (ref 78.0–100.0)
MONO ABS: 0.3 10*3/uL (ref 0.1–1.0)
Monocytes Relative: 5 % (ref 3–12)
Neutro Abs: 4.6 10*3/uL (ref 1.7–7.7)
Neutrophils Relative %: 80 % — ABNORMAL HIGH (ref 43–77)
PLATELETS: 313 10*3/uL (ref 150–400)
RBC: 4.74 MIL/uL (ref 3.87–5.11)
RDW: 15.2 % (ref 11.5–15.5)
WBC: 5.7 10*3/uL (ref 4.0–10.5)

## 2014-12-19 LAB — WET PREP, GENITAL
TRICH WET PREP: NONE SEEN
YEAST WET PREP: NONE SEEN

## 2014-12-19 LAB — BASIC METABOLIC PANEL
Anion gap: 5 (ref 5–15)
BUN: 7 mg/dL (ref 6–23)
CO2: 21 mmol/L (ref 19–32)
Calcium: 8.9 mg/dL (ref 8.4–10.5)
Chloride: 111 mmol/L (ref 96–112)
Creatinine, Ser: 0.66 mg/dL (ref 0.50–1.10)
GFR calc Af Amer: >90 mL/min (ref >90)
Glucose, Bld: 94 mg/dL (ref 70–99)
Potassium: 3.9 mmol/L (ref 3.5–5.1)
SODIUM: 137 mmol/L (ref 135–145)

## 2014-12-19 LAB — HCG, QUANTITATIVE, PREGNANCY: hCG, Beta Chain, Quant, S: 370 m[IU]/mL — ABNORMAL HIGH (ref <5)

## 2014-12-19 LAB — POC URINE PREG, ED: PREG TEST UR: POSITIVE — AB

## 2014-12-19 MED ORDER — MORPHINE SULFATE 4 MG/ML IJ SOLN
4.0000 mg | Freq: Once | INTRAMUSCULAR | Status: AC
  Administered 2014-12-19: 4 mg via INTRAVENOUS
  Filled 2014-12-19: qty 1

## 2014-12-19 MED ORDER — ONDANSETRON HCL 4 MG/2ML IJ SOLN
4.0000 mg | Freq: Once | INTRAMUSCULAR | Status: AC
  Administered 2014-12-19: 4 mg via INTRAVENOUS
  Filled 2014-12-19: qty 2

## 2014-12-19 MED ORDER — SODIUM CHLORIDE 0.9 % IV BOLUS (SEPSIS)
1000.0000 mL | Freq: Once | INTRAVENOUS | Status: AC
  Administered 2014-12-19: 1000 mL via INTRAVENOUS

## 2014-12-19 NOTE — ED Notes (Signed)
Pt reports onset this am of lower abd cramping. Having n/v since. Denies urinary or vaginal symptoms. Denies diarrhea.

## 2014-12-19 NOTE — ED Notes (Signed)
Pt comfortable with discharge and follow up instructions. Pt declines wheelchair, escorted to waiting area by this RN. No prescriptions. 

## 2014-12-19 NOTE — ED Provider Notes (Signed)
CSN: [id number]     Arrival date & time 12/19/14  0940 History   First MD Initiated Contact with Patient 12/19/14 1009     Chief Complaint  Patient presents with  . Emesis     (Consider location/radiation/quality/duration/timing/severity/associated sxs/prior Treatment) HPI Comments: Patient presents to the emergency department with chief complaint of nausea, vomiting, diarrhea, and abdominal cramps. Patient states that the diarrhea started yesterday. She has not had any today. She states the nausea and vomiting began this morning at around 8:00. She denies any hematemesis. 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 She denies any fevers, or chills. Denies any dysuria, or vaginal symptoms. There are no aggravating or alleviating factors. She denies prior abdominal surgeries with the exception of 2 C-sections.  The history is provided by the patient. No language interpreter was used.    History reviewed. No pertinent past medical history. Past Surgical History  Procedure Laterality Date  . Cesarean section  2004  . Cesarean section  2009  . Cyst excision     Family History  Problem Relation Age of Onset  . Cancer Maternal Aunt     Breast  . Cancer Maternal Grandmother     Lung  . Cancer Maternal Aunt     Cervical   History  Substance Use Topics  . Smoking status: Former Smoker    Types: Cigarettes    Quit date: 09/22/2011  . Smokeless tobacco: Never Used  . Alcohol Use: No   OB History    No data available     Review of Systems  Constitutional: Negative for fever and chills.  Respiratory: Negative for shortness of breath.   Cardiovascular: Negative for chest pain.  Gastrointestinal: Positive for nausea, vomiting, abdominal pain and diarrhea. Negative for constipation.  Genitourinary: Negative for dysuria.  All other systems reviewed and are negative.     Allergies  Review of patient's allergies  indicates no known allergies.  Home Medications   Prior to Admission medications   Medication Sig Start Date End Date Taking? Authorizing Provider  ibuprofen (ADVIL,MOTRIN) 200 MG tablet Take 400 mg by mouth every 6 (six) hours as needed for mild pain.    Historical Provider, MD  naproxen (NAPROSYN) 500 MG tablet Take 1 tablet (500 mg total) by mouth 2 (two) times daily with a meal. 01/09/14   Jillyn Ledger, PA-C  Norgestimate-Ethinyl Estradiol Triphasic 0.18/0.215/0.25 MG-25 MCG tab Take 1 tablet by mouth daily. 09/27/14   Brock Bad, MD   BP 137/83 mmHg  Pulse 99  Temp(Src) 98.1 F (36.7 C) (Oral)  Resp 16  Ht 5\' 5"  (1.651 m)  Wt 240 lb (108.863 kg)  BMI 39.94 kg/m2  SpO2 99%  LMP 11/21/2014 Physical Exam  Constitutional: She is oriented to person, place, and time. She appears well-developed and well-nourished.  HENT:  Head: Normocephalic and atraumatic.  Eyes: Conjunctivae and EOM are normal. Pupils are equal, round, and reactive to light.  Neck: Normal range of motion. Neck supple.  Cardiovascular: Normal rate and regular rhythm.  Exam reveals no gallop and no friction rub.   No murmur heard. Pulmonary/Chest: Effort normal and breath sounds normal. No respiratory distress. She has no wheezes. She has no rales. She exhibits no tenderness.  Abdominal: Soft. Bowel sounds are normal. She exhibits no distension and no mass. There is no tenderness. There is no rebound and no guarding.  Mild discomfort to palpation of  the left upper quadrant, no other focal abdominal tenderness  Genitourinary:  Pelvic exam chaperoned by female ER tech, no right or left adnexal tenderness, no uterine tenderness, thick white vaginal discharge, no bleeding, no CMT or friability, no foreign body, no injury to the external genitalia, no other significant findings   Musculoskeletal: Normal range of motion. She exhibits no edema or tenderness.  Neurological: She is alert and oriented to person, place,  and time.  Skin: Skin is warm and dry.  Psychiatric: She has a normal mood and affect. Her behavior is normal. Judgment and thought content normal.  Nursing note and vitals reviewed.   ED Course  Procedures (including critical care time) Results for orders placed or performed during the hospital encounter of 12/19/14  Wet prep, genital  Result Value Ref Range   Yeast Wet Prep HPF POC NONE SEEN NONE SEEN   Trich, Wet Prep NONE SEEN NONE SEEN   Clue Cells Wet Prep HPF POC FEW (A) NONE SEEN   WBC, Wet Prep HPF POC FEW (A) NONE SEEN  CBC with Differential/Platelet  Result Value Ref Range   WBC 5.7 4.0 - 10.5 K/uL   RBC 4.74 3.87 - 5.11 MIL/uL   Hemoglobin 11.5 (L) 12.0 - 15.0 g/dL   HCT 69.637.2 29.536.0 - 28.446.0 %   MCV 78.5 78.0 - 100.0 fL   MCH 24.3 (L) 26.0 - 34.0 pg   MCHC 30.9 30.0 - 36.0 g/dL   RDW 13.215.2 44.011.5 - 10.215.5 %   Platelets 313 150 - 400 K/uL   Neutrophils Relative % 80 (H) 43 - 77 %   Neutro Abs 4.6 1.7 - 7.7 K/uL   Lymphocytes Relative 13 12 - 46 %   Lymphs Abs 0.7 0.7 - 4.0 K/uL   Monocytes Relative 5 3 - 12 %   Monocytes Absolute 0.3 0.1 - 1.0 K/uL   Eosinophils Relative 2 0 - 5 %   Eosinophils Absolute 0.1 0.0 - 0.7 K/uL   Basophils Relative 0 0 - 1 %   Basophils Absolute 0.0 0.0 - 0.1 K/uL  Basic metabolic panel  Result Value Ref Range   Sodium 137 135 - 145 mmol/L   Potassium 3.9 3.5 - 5.1 mmol/L   Chloride 111 96 - 112 mmol/L   CO2 21 19 - 32 mmol/L   Glucose, Bld 94 70 - 99 mg/dL   BUN 7 6 - 23 mg/dL   Creatinine, Ser 7.250.66 0.50 - 1.10 mg/dL   Calcium 8.9 8.4 - 36.610.5 mg/dL   GFR calc non Af Amer >90 >90 mL/min   GFR calc Af Amer >90 >90 mL/min   Anion gap 5 5 - 15  Urinalysis, Routine w reflex microscopic  Result Value Ref Range   Color, Urine YELLOW YELLOW   APPearance HAZY (A) CLEAR   Specific Gravity, Urine 1.023 1.005 - 1.030   pH 6.0 5.0 - 8.0   Glucose, UA NEGATIVE NEGATIVE mg/dL   Hgb urine dipstick NEGATIVE NEGATIVE   Bilirubin Urine NEGATIVE  NEGATIVE   Ketones, ur NEGATIVE NEGATIVE mg/dL   Protein, ur NEGATIVE NEGATIVE mg/dL   Urobilinogen, UA 0.2 0.0 - 1.0 mg/dL   Nitrite NEGATIVE NEGATIVE   Leukocytes, UA NEGATIVE NEGATIVE  hCG, quantitative, pregnancy  Result Value Ref Range   hCG, Beta Chain, Quant, S 370 (H) <5 mIU/mL  POC urine preg, [facility])  Result Value Ref Range   Preg Test, Ur POSITIVE (A) NEGATIVE   [place] Ob Comp Less  14 Wks  12/19/2014   CLINICAL DATA:  Emesis and lower abdominal pain in first-trimester pregnancy. Quantitative beta HCG 370. Clinical dates 4 weeks.  EXAM: OBSTETRIC <14 WK [place] AND TRANSVAGINAL OB US  TECHNIQUE: Both transabdominal and transvaginal ultrasound examinations were performed for complete evaluation of the gestation as well as the maternal uterus, adnexal regions, and pelvic cul-de-sac. Transvaginal technique was performed to assess early pregnancy.  COMPARISON:  None.  FINDINGS: No intra or extrauterine gestational sac identified. When accounting for an intra-ovarian 2.5cm cystic structure with nonvascular eccentric mid level echoes (a probable corpus luteum with retracting clot), there is no adnexal mass lesion.  A 11 mm rounded mass at the intramural lower uterine segment anteriorly is most consistent with a fibroid, with unusual scarring from Cesarean section the main differential consideration. Small pelvic fluid is simple in appearance. No evidence of hemoperitoneum.  IMPRESSION: 1. Pregnancy of unknown location. Differential considerations include intrauterine gestation too early to be sonographically visualized, spontaneous abortion, or ectopic pregnancy. Consider follow-up ultrasound in 10 days and serial quantitative beta HCG follow-up. 2. 25mm left intra-ovarian complex cyst is likely a hemorrhagic corpus luteum. This can be re-evaluated at follow-up ultrasound. 3. Small, simple pelvic fluid. 4. 11 mm intramural fibroid in the lower uterine segment.   Electronically Signed   By: Marnee Spring M.D.   On: 12/19/2014 15:00   US Ob Transvaginal  12/19/2014   CLINICAL DATA:  Emesis and lower abdominal pain in first-trimester pregnancy. Quantitative beta HCG 370. Clinical dates 4 weeks.  EXAM: OBSTETRIC <14 WK [place] AND TRANSVAGINAL OB US  TECHNIQUE: Both transabdominal and transvaginal ultrasound examinations were performed for complete evaluation of the gestation as well as the maternal uterus, adnexal regions, and pelvic cul-de-sac. Transvaginal technique was performed to assess early pregnancy.  COMPARISON:  None.  FINDINGS: No intra or extrauterine gestational sac identified. When accounting for an intra-ovarian 2.5cm cystic structure with nonvascular eccentric mid level echoes (a probable corpus luteum with retracting clot), there is no adnexal mass lesion.  A 11 mm rounded mass at the intramural lower uterine segment anteriorly is most consistent with a fibroid, with unusual scarring from Cesarean section the main differential consideration. Small pelvic fluid is simple in appearance. No evidence of hemoperitoneum.  IMPRESSION: 1. Pregnancy of unknown location. Differential considerations include intrauterine gestation too early to be sonographically visualized, spontaneous abortion, or ectopic pregnancy. Consider follow-up ultrasound in 10 days and serial quantitative beta HCG follow-up. 2. 25mm left intra-ovarian complex cyst is likely a hemorrhagic corpus luteum. This can be re-evaluated at follow-up ultrasound. 3. Small, simple pelvic fluid. 4. 11 mm intramural fibroid in the lower uterine segment.   Electronically Signed   By: Marnee Spring M.D.   On: 12/19/2014 15:00      EKG Interpretation None      MDM   Final diagnoses:  Pregnancy  Abdominal pain, unspecified abdominal location  Nausea and vomiting, vomiting of unspecified type    Patient with nausea, vomiting, diarrhea. Also reports some abdominal cramping. Suspect that symptoms are related to viral syndrome given  patient's multiple sick contacts. Will give fluids, check labs, and urine. Anticipate discharge to home with antiemetics if patient is able tolerate oral intake.  12:00 PM Pain has improved significantly, patient is feeling better. Will fluid challenge. Anticipate discharge to home. Pending normal urinalysis.  Urine pregnancy test is positive, patient will require further workup given abdominal cramping. Check hCG, pelvic exam, and pelvic ultrasound.  Pelvic exam  remarkable for thick white vaginal discharge, no vaginal bleeding, no adnexal or uterine tenderness.  Pelvic ultrasound is inconclusive, no identifiable pregnancy. This is likely because of the early age of the pregnancy. Patient will need to have repeat hCG testing done in 2 days at [facility]. I discussed this in detail with the patient, who understands and agrees with the plan. Recommend Tylenol for pain and cramping. Return to [facility] for new or worsening symptoms in the meantime. Patient is stable and ready for discharge.    Roxy Horseman, PA-C 12/19/14 1532  Bethann Berkshire, MD 12/20/14 [phone]

## 2014-12-19 NOTE — Discharge Instructions (Signed)
You must follow-up with [facility] in 2 days for repeat hCG blood test. You may take Tylenol for pain. [facility] if your symptoms change or worsen in the meantime.  Abdominal Pain During Pregnancy Abdominal pain is common in pregnancy. Most of the time, it does not cause harm. There are many causes of abdominal pain. Some causes are more serious than others. Some of the causes of abdominal pain in pregnancy are easily diagnosed. Occasionally, the diagnosis takes time to understand. Other times, the cause is not determined. Abdominal pain can be a sign that something is very wrong with the pregnancy, or the pain may have nothing to do with the pregnancy at all. For this reason, always tell your health care provider if you have any abdominal discomfort. HOME CARE INSTRUCTIONS  Monitor your abdominal pain for any changes. The following actions may help to alleviate any discomfort you are experiencing:  Do not have sexual intercourse or put anything in your vagina until your symptoms go away completely.  Get plenty of rest until your pain improves.  Drink clear fluids if you feel nauseous. Avoid solid food as long as you are uncomfortable or nauseous.  Only take over-the-counter or prescription medicine as directed by your health care provider.  Keep all follow-up appointments with your health care provider. SEEK IMMEDIATE MEDICAL CARE IF:  You are bleeding, leaking fluid, or passing tissue from the vagina.  You have increasing pain or cramping.  You have persistent vomiting.  You have painful or bloody urination.  You have a fever.  You notice a decrease in your baby's movements.  You have extreme weakness or feel faint.  You have shortness of breath, with or without abdominal pain.  You develop a severe headache with abdominal pain.  You have abnormal vaginal discharge with abdominal pain.  You have persistent diarrhea.  You have abdominal pain that  continues even after rest, or gets worse. MAKE SURE YOU:   Understand these instructions.  Will watch your condition.  Will get help right away if you are not doing well or get worse. Document Released: 09/07/2005 Document Revised: 06/28/2013 Document Reviewed: 04/06/2013 [facility] Patient Information 2015 [employer] [place]. This information is not intended to replace advice given to you by your health care provider. Make sure you discuss any questions you have with your health care provider.

## 2014-12-20 LAB — GC/CHLAMYDIA PROBE AMP (~~LOC~~) NOT AT ARMC
Chlamydia: NEGATIVE
Neisseria Gonorrhea: NEGATIVE

## 2014-12-20 LAB — HIV ANTIBODY (ROUTINE TESTING W REFLEX): HIV Screen 4th Generation wRfx: NONREACTIVE

## 2014-12-21 ENCOUNTER — Inpatient Hospital Stay (HOSPITAL_COMMUNITY)
Admission: AD | Admit: 2014-12-21 | Discharge: 2014-12-21 | Disposition: A | Discharge status: Home / Self Care | Payer: 59 | Source: Ambulatory Visit | Attending: Obstetrics | Admitting: Obstetrics

## 2014-12-21 ENCOUNTER — Encounter (HOSPITAL_COMMUNITY): Payer: Self-pay | Admitting: *Deleted

## 2014-12-21 DIAGNOSIS — E860 Dehydration: Secondary | ICD-10-CM | POA: Insufficient documentation

## 2014-12-21 DIAGNOSIS — Z87891 Personal history of nicotine dependence: Secondary | ICD-10-CM | POA: Diagnosis not present

## 2014-12-21 DIAGNOSIS — A084 Viral intestinal infection, unspecified: Secondary | ICD-10-CM | POA: Diagnosis not present

## 2014-12-21 DIAGNOSIS — R42 Dizziness and giddiness: Secondary | ICD-10-CM | POA: Diagnosis present

## 2014-12-21 DIAGNOSIS — R109 Unspecified abdominal pain: Secondary | ICD-10-CM

## 2014-12-21 DIAGNOSIS — O26899 Other specified pregnancy related conditions, unspecified trimester: Secondary | ICD-10-CM

## 2014-12-21 DIAGNOSIS — Z3201 Encounter for pregnancy test, result positive: Secondary | ICD-10-CM

## 2014-12-21 DIAGNOSIS — O9989 Other specified diseases and conditions complicating pregnancy, childbirth and the puerperium: Secondary | ICD-10-CM | POA: Insufficient documentation

## 2014-12-21 HISTORY — DX: Unspecified abnormal cytological findings in specimens from vagina: R87.629

## 2014-12-21 HISTORY — DX: Headache: R51

## 2014-12-21 HISTORY — DX: Headache, unspecified: R51.9

## 2014-12-21 HISTORY — DX: Essential (primary) hypertension: I10

## 2014-12-21 LAB — URINALYSIS, ROUTINE W REFLEX MICROSCOPIC
BILIRUBIN URINE: NEGATIVE
Glucose, UA: NEGATIVE mg/dL
KETONES UR: NEGATIVE mg/dL
Leukocytes, UA: NEGATIVE
Nitrite: NEGATIVE
Protein, ur: NEGATIVE mg/dL
UROBILINOGEN UA: 0.2 mg/dL (ref 0.0–1.0)
pH: 5.5 (ref 5.0–8.0)

## 2014-12-21 LAB — URINE MICROSCOPIC-ADD ON

## 2014-12-21 LAB — HCG, QUANTITATIVE, PREGNANCY: HCG, BETA CHAIN, QUANT, S: 965 m[IU]/mL — AB (ref <5)

## 2014-12-21 NOTE — MAU Note (Signed)
Pt presents to MAU with complaints of dizziness and diarrhea since yesterday. Had blood work 2 days ago and was told to come for repeat BHCG today. Denies any pain

## 2014-12-21 NOTE — MAU Provider Note (Signed)
Chief Complaint: Dizziness; Diarrhea; and follow up quant    First Provider Initiated Contact with Patient 12/21/14 1147     SUBJECTIVE HPI: Marisa Yu is a 29 y.o. [id number] with LMP 11/21/14 presents to [facility] for repeat quant hcg. She presented to MAU 2 days ago for n/v/d.  She had diarrhea until yesterday, with 5-7 episodes in last 24 hours.  Today she reports dizziness of 1 day duration. The dizziness began while she was driving and it comes and goes while she is sitting or standing. She also endorses 1 episode of blurry vision yesterday that lasted 15 second and endorses diarrhea yesterday and this morning but it has resolved. She states her oral intake has decreased since yesterday. She denies nausea, vomiting, vaginal bleeding, urinary frequency, dysuria, fever, chills, and cramping. She has chronic intermittent headaches that have not changed recently.   Quant hcg on 3/30 was 370 with ultrasound indicating no evidence of IUP or ectopic pregnancy.   Past Medical History  Diagnosis Date  . Hypertension   . Headache   . Vaginal Pap smear, abnormal    OB History  Gravida Para Term Preterm AB SAB TAB Ectopic Multiple Living  3 2 2       2     # Outcome Date GA Lbr Len/2nd Weight Sex Delivery Anes PTL Lv  3 Gravida           2 Term           1 Term              Past Surgical History  Procedure Laterality Date  . Cesarean section  2004  . Cesarean section  2009  . Cyst excision     History   Social History  . Marital Status: Single    Spouse Name: N/A  . Number of Children: 2  . Years of Education: N/A   Occupational History  . Childcare    Social History Main Topics  . Smoking status: Former Smoker    Types: Cigarettes    Quit date: 09/22/2011  . Smokeless tobacco: Never Used  . Alcohol Use: No  . Drug Use: No  . Sexual Activity: No     Comment: last sex March 26   Other Topics Concern  . Not on file   Social History Narrative   No current  facility-administered medications on file prior to encounter.   Current Outpatient Prescriptions on File Prior to Encounter  Medication Sig Dispense Refill  . Multiple Vitamins-Minerals (MULTIVITAL) CHEW Chew 2 tablets by mouth daily.     Allergies  Allergen Reactions  . Codeine Itching    Review of Systems  Gastrointestinal: Negative for abdominal pain.  Genitourinary:       Negative vaginal bleeding    OBJECTIVE Blood pressure 127/74, pulse 78, temperature 98.1 F (36.7 C), resp. rate 16, height 5\' 5"  (1.651 m), weight 113.853 kg (251 lb), last menstrual period 11/21/2014. GENERAL: Well-developed, well-nourished female in no acute distress.  RESP: normal effort GI: Abdomen soft, non-tender.  NEURO: Alert and oriented  LAB RESULTS Results for orders placed or performed during the hospital encounter of 12/21/14 (from the past 24 hour(s))  hCG, quantitative, pregnancy     Status: Abnormal   Collection Time: 12/21/14  9:55 AM  Result Value Ref Range   hCG, Beta Chain, Quant, S 965 (H) <5 mIU/mL  Urinalysis, Routine w reflex microscopic     Status: Abnormal   Collection Time: 12/21/14  9:55 AM  Result Value Ref Range   Color, Urine YELLOW YELLOW   APPearance CLEAR CLEAR   Specific Gravity, Urine >1.030 (H) 1.005 - 1.030   pH 5.5 5.0 - 8.0   Glucose, UA NEGATIVE NEGATIVE mg/dL   Hgb urine dipstick TRACE (A) NEGATIVE   Bilirubin Urine NEGATIVE NEGATIVE   Ketones, ur NEGATIVE NEGATIVE mg/dL   Protein, ur NEGATIVE NEGATIVE mg/dL   Urobilinogen, UA 0.2 0.0 - 1.0 mg/dL   Nitrite NEGATIVE NEGATIVE   Leukocytes, UA NEGATIVE NEGATIVE  Urine microscopic-add on     Status: Abnormal   Collection Time: 12/21/14  9:55 AM  Result Value Ref Range   Squamous Epithelial / LPF FEW (A) RARE   WBC, UA 0-2 <3 WBC/hpf   RBC / HPF 0-2 <3 RBC/hpf   Bacteria, UA FEW (A) RARE   Urine-Other MUCOUS PRESENT     MAU COURSE Repeat bHCG  ASSESSMENT 1. Viral gastroenteritis   2. Mild  dehydration   3. Pregnancy confirmed by positive blood test   4. Abdominal pain in pregnancy     Repeat HCG levels rose appropriately. Patient is dehydrated from diarrhea and decreased oral intake.   PLAN Discharge home in stable condition with increased fluid intake.  Outpatient follow up ultrasound in 1 week      Follow-up Information    Follow up with THE [facility] [place] [facility].   Specialty:  Radiology   Contact information:   [address] [id number] mc [place] [address] [phone]      Follow up with THE [facility] [place] MATERNITY ADMISSIONS.   Why:  As needed for emergencies   Contact information:   [address] [id number] mc [place] [address] [phone]       Medication List    STOP taking these medications        naproxen 500 MG tablet  Commonly known as:  NAPROSYN     Norgestimate-Ethinyl Estradiol Triphasic 0.18/0.215/0.25 MG-25 MCG tab      TAKE these medications        MULTIVITAL Chew  Chew 2 tablets by mouth daily.         LEFTWICH-KIRBY, Rosalba Totty, CNM 2:39 PM

## 2014-12-21 NOTE — Discharge Instructions (Signed)

## 2015-01-01 ENCOUNTER — Encounter: Payer: Self-pay | Admitting: Obstetrics

## 2015-01-01 ENCOUNTER — Ambulatory Visit: Payer: Self-pay | Admitting: Obstetrics

## 2015-01-01 ENCOUNTER — Ambulatory Visit (INDEPENDENT_AMBULATORY_CARE_PROVIDER_SITE_OTHER): Payer: 59 | Admitting: Obstetrics

## 2015-01-01 VITALS — BP 121/83 | HR 87 | Temp 98.7°F | Ht 65.0 in | Wt 254.0 lb

## 2015-01-01 DIAGNOSIS — B9689 Other specified bacterial agents as the cause of diseases classified elsewhere: Secondary | ICD-10-CM

## 2015-01-01 DIAGNOSIS — A499 Bacterial infection, unspecified: Secondary | ICD-10-CM

## 2015-01-01 DIAGNOSIS — N760 Acute vaginitis: Secondary | ICD-10-CM

## 2015-01-01 DIAGNOSIS — Z01419 Encounter for gynecological examination (general) (routine) without abnormal findings: Secondary | ICD-10-CM | POA: Diagnosis not present

## 2015-01-01 DIAGNOSIS — Z124 Encounter for screening for malignant neoplasm of cervix: Secondary | ICD-10-CM

## 2015-01-01 DIAGNOSIS — L0292 Furuncle, unspecified: Secondary | ICD-10-CM | POA: Diagnosis not present

## 2015-01-01 DIAGNOSIS — Z872 Personal history of diseases of the skin and subcutaneous tissue: Secondary | ICD-10-CM | POA: Diagnosis not present

## 2015-01-01 MED ORDER — CLINDAMYCIN HCL 300 MG PO CAPS: 300.0000 mg | ORAL_CAPSULE | Freq: Three times a day (TID) | ORAL | Status: DC

## 2015-01-01 NOTE — Progress Notes (Signed)
Subjective:        Marisa Yu is a 29 y.o. female here for a routine exam.  Current complaints: Recurrent boils.    Personal health questionnaire:  Is patient [name], have a family history of breast and/or ovarian cancer: no Is there a family history of uterine cancer diagnosed at age < 37, gastrointestinal cancer, urinary tract cancer, family member who is a Personnel officer syndrome-associated carrier: no Is the patient overweight and hypertensive, family history of diabetes, personal history of gestational diabetes, preeclampsia or PCOS: no Is patient over 23, have PCOS,  family history of premature CHD under age 38, diabetes, smoke, have hypertension or peripheral artery disease:  no At any time, has a partner hit, kicked or otherwise hurt or frightened you?: no Over the past 2 weeks, have you felt down, depressed or hopeless?: no Over the past 2 weeks, have you felt little interest or pleasure in doing things?:no   Gynecologic History Patient's last menstrual period was 11/21/2014 (exact date). Contraception: none Last Pap: 2015. Results were: normal Last mammogram: n/a. Results were: n/a  Obstetric History OB History  Gravida Para Term Preterm AB SAB TAB Ectopic Multiple Living  # Outcome Date GA Lbr Len/2nd Weight Sex Delivery Anes PTL Lv  3 Gravida           2 Term           1 Term               Past Medical History  Diagnosis Date  . Hypertension   . Headache   . Vaginal Pap smear, abnormal   . Boil, arm   . Tooth ache     Past Surgical History  Procedure Laterality Date  . Cesarean section  2004  . Cesarean section  2009  . Cyst excision       Current outpatient prescriptions:  Marland Kitchen  Multiple Vitamins-Minerals (MULTIVITAL) CHEW, Chew 2 tablets by mouth daily., Disp: , Rfl:  .  clindamycin (CLEOCIN) 300 MG capsule, Take 1 capsule (300 mg total) by mouth 3 (three) times daily., Disp: 30 capsule, Rfl: 2 Allergies  Allergen Reactions   . Codeine Itching    History  Substance Use Topics  . Smoking status: Former Smoker    Types: Cigarettes    Quit date: 09/22/2011  . Smokeless tobacco: Never Used  . Alcohol Use: No    Family History  Problem Relation Age of Onset  . Cancer Maternal Aunt     Breast  . Cancer Maternal Grandmother     Lung  . Cancer Maternal Aunt     Cervical  . Heart disease Maternal Grandfather       Review of Systems  Constitutional: negative for fatigue and weight loss Respiratory: negative for cough and wheezing Cardiovascular: negative for chest pain, fatigue and palpitations Gastrointestinal: negative for abdominal pain and change in bowel habits Musculoskeletal:negative for myalgias Neurological: negative for gait problems and tremors Behavioral/Psych: negative for abusive relationship, depression Endocrine: negative for temperature intolerance   Genitourinary:negative for abnormal menstrual periods, genital lesions, hot flashes, sexual problems and vaginal discharge Integument/breast: positive for axillary boil. negative for breast lump, breast tenderness, nipple discharge and skin lesion(s)    Objective:       BP 121/83 mmHg  Pulse 87  Temp(Src) 98.7 F (37.1 C)  Ht  (1.651 m)  Wt 254 lb (115.214 kg)  BMI  42.27 kg/m2  LMP 11/21/2014 (Exact Date) General:   alert  Skin:   no rash or abnormalities  Lungs:   clear to auscultation bilaterally  Heart:   regular rate and rhythm, S1, S2 normal, no murmur, click, rub or gallop  Breasts:   normal without suspicious masses, skin or nipple changes or axillary nodes  Abdomen:  normal findings: no organomegaly, soft, non-tender and no hernia  Pelvis:  External genitalia: normal general appearance Urinary system: urethral meatus normal and bladder without fullness, nontender Vaginal: normal without tenderness, induration or masses Cervix: normal appearance Adnexa: normal bimanual exam Uterus: anteverted and non-tender,  normal size   Lab Review Urine pregnancy test Labs reviewed yes Radiologic studies reviewed yes   Assessment:    Healthy female exam.    Early pregnancy.  Recurrent boils  H/O Pilonidal Cyst  BV    Plan:   Patient not sure if she wants to keep pregnancy. Clindamycin Rx Will refer to General Surgery Will meet with Counselor today.   Education reviewed: depression evaluation, low fat, low cholesterol diet and safe sex/STD prevention. Follow up in: a few months.   Meds ordered this encounter  Medications  . clindamycin (CLEOCIN) 300 MG capsule    Sig: Take 1 capsule (300 mg total) by mouth 3 (three) times daily.    Dispense:  30 capsule    Refill:  2   No orders of the defined types were placed in this encounter.

## 2015-01-01 NOTE — Addendum Note (Signed)
Addended by: Marya LandryFOSTER, Keisa Blow D on: 01/01/2015 01:36 PM   Modules accepted: Orders

## 2015-01-02 LAB — PAP IG W/ RFLX HPV ASCU

## 2015-01-04 ENCOUNTER — Telehealth: Payer: Self-pay | Admitting: Obstetrics

## 2015-01-07 NOTE — Telephone Encounter (Signed)
[id number] - Spoke with patient. [facility] Per Erskine SquibbJane - did not schedule until after US. ADvised patient to call me back after US. brm

## 2015-01-08 ENCOUNTER — Inpatient Hospital Stay (HOSPITAL_COMMUNITY)
Admission: AD | Admit: 2015-01-08 | Discharge: 2015-01-08 | Disposition: A | Discharge status: Home / Self Care | Payer: 59 | Source: Ambulatory Visit | Attending: Obstetrics | Admitting: Obstetrics

## 2015-01-08 ENCOUNTER — Ambulatory Visit (HOSPITAL_COMMUNITY)
Admission: RE | Admit: 2015-01-08 | Discharge: 2015-01-08 | Disposition: A | Discharge status: Home / Self Care | Payer: 59 | Source: Ambulatory Visit | Attending: Advanced Practice Midwife | Admitting: Advanced Practice Midwife

## 2015-01-08 DIAGNOSIS — N8320 Unspecified ovarian cysts: Secondary | ICD-10-CM | POA: Insufficient documentation

## 2015-01-08 DIAGNOSIS — O3481 Maternal care for other abnormalities of pelvic organs, first trimester: Secondary | ICD-10-CM | POA: Diagnosis not present

## 2015-01-08 DIAGNOSIS — O9989 Other specified diseases and conditions complicating pregnancy, childbirth and the puerperium: Secondary | ICD-10-CM | POA: Diagnosis not present

## 2015-01-08 DIAGNOSIS — Z3A01 Less than 8 weeks gestation of pregnancy: Secondary | ICD-10-CM

## 2015-01-08 DIAGNOSIS — Z3201 Encounter for pregnancy test, result positive: Secondary | ICD-10-CM

## 2015-01-08 DIAGNOSIS — R109 Unspecified abdominal pain: Secondary | ICD-10-CM | POA: Diagnosis not present

## 2015-01-08 DIAGNOSIS — O26899 Other specified pregnancy related conditions, unspecified trimester: Secondary | ICD-10-CM

## 2015-01-08 NOTE — MAU Provider Note (Signed)
Pt here for f/u ultrasound for viability Marisa Yu Ob Transvaginal  01/08/2015   CLINICAL DATA:  Patient with abdominal pain in pregnancy. Appropriate rise in quantitative beta HCG.  EXAM: TRANSVAGINAL OB ULTRASOUND  TECHNIQUE: Transvaginal ultrasound was performed for complete evaluation of the gestation as well as the maternal uterus, adnexal regions, and pelvic cul-de-sac.  COMPARISON:  Pelvic ultrasound 12/19/2014  FINDINGS: Intrauterine gestational sac: Visualized/normal in shape.  Yolk sac:  Present  Embryo:  Present  Cardiac Activity: Present  Heart Rate: 137 bpm  CRL:   7.7  mm   6 w 5 d                  Marisa Yu EDC: 08/29/2015  Maternal uterus/adnexae: The right ovary is normal. There are 2 cysts within the left ovary measuring up to 1.9 and 1.8 cm respectively. No subchorionic hemorrhage. Small amount of free fluid in the pelvis.  IMPRESSION: Single live intrauterine gestation 6 weeks 6 days by LMP.  Small amount of pelvic free fluid.   Electronically Signed   By: Annia Beltrew  Davis M.D.   On: 01/08/2015 13:22  generally WDWN pleasant female in no acute distress- still small amount of cramping, no spotting/ bleeding IMP- single viable IUP 4587w0d Plan- [facility]

## 2015-01-14 ENCOUNTER — Telehealth: Payer: Self-pay | Admitting: *Deleted

## 2015-01-14 NOTE — Telephone Encounter (Signed)
Pt contacted the clinic with concerns about inability to sleep due to constant urination and pain in legs. [facility]  [facility] [redacted] wks gestation.   Informed patient that urinary frequency is a sign of early pregnancy.  Pt describes burning sensation in her thighs.  Encouraged patient to eat bananas.  If any other concerns, call [facility] or go to [facility], pt verbalizes understanding and has no further questions.

## 2015-01-21 ENCOUNTER — Telehealth: Payer: Self-pay | Admitting: *Deleted

## 2015-01-21 NOTE — Telephone Encounter (Signed)
Patient is requesting a Rx for nausea. 11:48 Spoke with patient- she states she is vomiting 2-3 times a day and feels motion sick. She has not tried anything OTC yet. Advised to try to avoid strong antinausea medications in early pregnancy due to recent studies. Advise OTC Unisom or Bonine, B natal, strategies for decreasing nausea- grazing not getting empty, ginger or citrus. Patient to call back if these things do not work.

## 2015-01-25 ENCOUNTER — Telehealth: Payer: Self-pay | Admitting: *Deleted

## 2015-01-25 ENCOUNTER — Other Ambulatory Visit: Payer: Self-pay | Admitting: *Deleted

## 2015-01-25 NOTE — Telephone Encounter (Signed)
Pt called to office requesting Rx for bacterial infection.   Return call to pt.  LM on VM to contact office for symptom review in order to get Rx.

## 2015-01-29 ENCOUNTER — Ambulatory Visit (INDEPENDENT_AMBULATORY_CARE_PROVIDER_SITE_OTHER): Payer: 59 | Admitting: Certified Nurse Midwife

## 2015-01-29 ENCOUNTER — Encounter: Payer: Self-pay | Admitting: Certified Nurse Midwife

## 2015-01-29 VITALS — BP 120/80 | HR 89 | Temp 97.9°F | Ht 65.0 in | Wt 254.0 lb

## 2015-01-29 DIAGNOSIS — O219 Vomiting of pregnancy, unspecified: Secondary | ICD-10-CM

## 2015-01-29 DIAGNOSIS — Z3481 Encounter for supervision of other normal pregnancy, first trimester: Secondary | ICD-10-CM | POA: Diagnosis not present

## 2015-01-29 DIAGNOSIS — A499 Bacterial infection, unspecified: Secondary | ICD-10-CM | POA: Diagnosis not present

## 2015-01-29 DIAGNOSIS — B9689 Other specified bacterial agents as the cause of diseases classified elsewhere: Secondary | ICD-10-CM

## 2015-01-29 DIAGNOSIS — N760 Acute vaginitis: Secondary | ICD-10-CM | POA: Diagnosis not present

## 2015-01-29 DIAGNOSIS — Z3491 Encounter for supervision of normal pregnancy, unspecified, first trimester: Secondary | ICD-10-CM

## 2015-01-29 LAB — TSH: TSH: 0.128 u[IU]/mL — AB (ref 0.350–4.500)

## 2015-01-29 MED ORDER — METRONIDAZOLE 0.75 % VA GEL: 1.0000 | Freq: Two times a day (BID) | VAGINAL | Status: DC

## 2015-01-29 MED ORDER — DOXYLAMINE-PYRIDOXINE 10-10 MG PO TBEC: 1.0000 | DELAYED_RELEASE_TABLET | Freq: Three times a day (TID) | ORAL | Status: DC

## 2015-01-29 NOTE — Progress Notes (Signed)
Patient ID: Marisa Quailsshley M Surprenant, female   DOB: [birth year], 29 y.o.   MRN: [id number]  Subjective:    Marisa Yu is being seen today for her first obstetrical visit.  This is a planned pregnancy. She is at 1290w6d gestation. Her obstetrical history is significant for obesity. Relationship with FOB: spouse, living together. Patient does intend to breast feed. Pregnancy history fully reviewed.  Has been having discharge with odor for several weeks.    The information documented in the HPI was reviewed and verified.  Menstrual History: OB History    Gravida Para Term Preterm AB TAB SAB Ectopic Multiple Living   4 2 2       2        Patient's last menstrual period was 11/21/2014.  Sure of LMP    Past Medical History  Diagnosis Date  . Hypertension   . Headache   . Vaginal Pap smear, abnormal   . Boil, arm   . Tooth ache     Past Surgical History  Procedure Laterality Date  . Cesarean section  2004  . Cesarean section  2009  . Cyst excision       (Not in a hospital admission) Allergies  Allergen Reactions  . Codeine Itching    History  Substance Use Topics  . Smoking status: Former Smoker    Types: Cigarettes    Quit date: 09/22/2011  . Smokeless tobacco: Never Used  . Alcohol Use: No    Family History  Problem Relation Age of Onset  . Cancer Maternal Aunt     Breast  . Cancer Maternal Grandmother     Lung  . Cancer Maternal Aunt     Cervical  . Heart disease Maternal Grandfather      Review of Systems Constitutional: negative for weight loss Gastrointestinal: negative for vomiting Genitourinary:negative for genital lesions and vaginal discharge and dysuria Musculoskeletal:negative for back pain Behavioral/Psych: negative for abusive relationship, depression, illegal drug usage and tobacco use    Objective:    BP 120/80 mmHg  Pulse 89  Temp(Src) 97.9 F (36.6 C)  Ht 5\' 5"  (1.651 m)  Wt 115.214 kg (254 lb)  BMI 42.27 kg/m2  LMP 11/21/2014 General  Appearance:    Alert, cooperative, no distress, appears stated age  Head:    Normocephalic, without obvious abnormality, atraumatic  Eyes:    PERRL, conjunctiva/corneas clear, EOM's intact, fundi    benign, both eyes  Ears:    Normal TM's and external ear canals, both ears  Nose:   Nares normal, septum midline, mucosa normal, no drainage    or sinus tenderness  Throat:   Lips, mucosa, and tongue normal; teeth and gums normal  Neck:   Supple, symmetrical, trachea midline, no adenopathy;    thyroid:  no enlargement/tenderness/nodules; no carotid   bruit or JVD  Back:     Symmetric, no curvature, ROM normal, no CVA tenderness  Lungs:     Clear to auscultation bilaterally, respirations unlabored  Chest Wall:    No tenderness or deformity   Heart:    Regular rate and rhythm, S1 and S2 normal, no murmur, rub   or gallop  Breast Exam:    No tenderness, masses, or nipple abnormality  Abdomen:     Soft, non-tender, bowel sounds active all four quadrants,    no masses, no organomegaly  Genitalia:    Normal female without lesion, discharge or tenderness  Extremities:   Extremities normal, atraumatic, no cyanosis or edema  Pulses:   2+ and symmetric all extremities  Skin:   Skin color, texture, turgor normal, no rashes or lesions  Lymph nodes:   Cervical, supraclavicular, and axillary nodes normal  Neurologic:   CNII-XII intact, normal strength, sensation and reflexes    throughout      Lab Review Urine pregnancy test Labs reviewed yes Radiologic studies reviewed no  Assessment:    Pregnancy at 4826w6d weeks   Nausea & vomiting BV   Plan:    New OB labs obtained and physical completed.  Needs new OB teaching.   Prenatal vitamins.  Diclegis. Counseling provided regarding continued use of seat belts, cessation of alcohol consumption, smoking or use of illicit drugs; infection precautions i.e., influenza/TDAP immunizations, toxoplasmosis,CMV, parvovirus, listeria and varicella; workplace  safety, exercise during pregnancy; routine dental care, safe medications, sexual activity, hot tubs, saunas, pools, travel, caffeine use, fish and methlymercury, potential toxins, hair treatments, varicose veins Weight gain recommendations per IOM guidelines reviewed: underweight/BMI< 18.5--> gain 28 - 40 lbs; normal weight/BMI 18.5 - 24.9--> gain 25 - 35 lbs; overweight/BMI 25 - 29.9--> gain 15 - 25 lbs; obese/BMI >30->gain  11 - 20 lbs Problem list reviewed and updated. FIRST/CF mutation testing/NIPT/QUAD SCREEN/fragile X/[name] population testing/Spinal muscular atrophy discussed: requested. Role of ultrasound in pregnancy discussed; fetal survey: requested. Amniocentesis discussed: not indicated. VBAC calculator score: VBAC consent form provided Meds ordered this encounter  Medications  . Prenatal Vit-Fe Fumarate-FA (PRENATAL PO)    Sig: Take by mouth.  . Doxylamine-Pyridoxine (DICLEGIS) 10-10 MG TBEC    Sig: Take 1 tablet by mouth 3 (three) times daily.    Dispense:  100 tablet    Refill:  3  . metroNIDAZOLE (METROGEL VAGINAL) 0.75 % vaginal gel    Sig: Place 1 Applicatorful vaginally 2 (two) times daily.    Dispense:  70 g    Refill:  0   Orders Placed This Encounter  Procedures  . SureSwab, Vaginosis/Vaginitis Plus  . Culture, OB Urine  . Obstetric panel  . HIV antibody  . Hemoglobinopathy evaluation  . Varicella zoster antibody, IgG  . Vit D  25 hydroxy (rtn osteoporosis monitoring)  . TSH    Follow up in 4 weeks. 50% of 30 min visit spent on counseling and coordination of care.

## 2015-01-30 ENCOUNTER — Other Ambulatory Visit: Payer: Self-pay | Admitting: *Deleted

## 2015-01-30 DIAGNOSIS — O219 Vomiting of pregnancy, unspecified: Secondary | ICD-10-CM

## 2015-01-30 LAB — OBSTETRIC PANEL
ANTIBODY SCREEN: NEGATIVE
BASOS PCT: 0 % (ref 0–1)
Basophils Absolute: 0 10*3/uL (ref 0.0–0.1)
Eosinophils Absolute: 0.1 10*3/uL (ref 0.0–0.7)
Eosinophils Relative: 1 % (ref 0–5)
HEMATOCRIT: 34.4 % — AB (ref 36.0–46.0)
HEMOGLOBIN: 10.9 g/dL — AB (ref 12.0–15.0)
HEP B S AG: NEGATIVE
LYMPHS ABS: 1.8 10*3/uL (ref 0.7–4.0)
LYMPHS PCT: 26 % (ref 12–46)
MCH: 24.2 pg — ABNORMAL LOW (ref 26.0–34.0)
MCHC: 31.7 g/dL (ref 30.0–36.0)
MCV: 76.4 fL — ABNORMAL LOW (ref 78.0–100.0)
MPV: 8.5 fL — ABNORMAL LOW (ref 8.6–12.4)
Monocytes Absolute: 0.6 10*3/uL (ref 0.1–1.0)
Monocytes Relative: 8 % (ref 3–12)
NEUTROS PCT: 65 % (ref 43–77)
Neutro Abs: 4.6 10*3/uL (ref 1.7–7.7)
Platelets: 360 10*3/uL (ref 150–400)
RBC: 4.5 MIL/uL (ref 3.87–5.11)
RDW: 16.7 % — ABNORMAL HIGH (ref 11.5–15.5)
Rh Type: POSITIVE
Rubella: 2.5 Index — ABNORMAL HIGH (ref <0.90)
WBC: 7 10*3/uL (ref 4.0–10.5)

## 2015-01-30 LAB — VITAMIN D 25 HYDROXY (VIT D DEFICIENCY, FRACTURES): Vit D, 25-Hydroxy: 26 ng/mL — ABNORMAL LOW (ref 30–100)

## 2015-01-30 LAB — VARICELLA ZOSTER ANTIBODY, IGG: Varicella IgG: 1148 Index — ABNORMAL HIGH (ref <135.00)

## 2015-01-30 LAB — HIV ANTIBODY (ROUTINE TESTING W REFLEX): HIV 1&2 Ab, 4th Generation: NONREACTIVE

## 2015-01-30 MED ORDER — DOXYLAMINE-PYRIDOXINE 10-10 MG PO TBEC: 1.0000 | DELAYED_RELEASE_TABLET | Freq: Three times a day (TID) | ORAL | Status: DC

## 2015-01-30 NOTE — Progress Notes (Signed)
[facility]is has been re[facility] due to no insurance coverage. Pt made aware of change.  Pt advised that we will call her if Diclegis rep brings new samples in order for her to have until Rx can be processed.

## 2015-01-31 ENCOUNTER — Telehealth: Payer: Self-pay | Admitting: *Deleted

## 2015-01-31 LAB — CULTURE, OB URINE: Colony Count: 25000

## 2015-01-31 LAB — HEMOGLOBINOPATHY EVALUATION
HEMOGLOBIN OTHER: 0 %
HGB A2 QUANT: 2.3 % (ref 2.2–3.2)
HGB F QUANT: 0 % (ref 0.0–2.0)
Hgb A: 97.7 % (ref 96.8–97.8)
Hgb S Quant: 0 %

## 2015-01-31 NOTE — Telephone Encounter (Signed)
Placed call to pt.  LM on VM making pt aware that Diclegis samples are in office if she would like to come by and pick up.   [facility] due to insurance coverage problems.

## 2015-02-01 LAB — SURESWAB, VAGINOSIS/VAGINITIS PLUS
ATOPOBIUM VAGINAE: NOT DETECTED Log (cells/mL)
C. GLABRATA, DNA: NOT DETECTED
C. TRACHOMATIS RNA, TMA: NOT DETECTED
C. albicans, DNA: DETECTED — AB
C. parapsilosis, DNA: NOT DETECTED
C. tropicalis, DNA: NOT DETECTED
Gardnerella vaginalis: 5.9 Log (cells/mL)
LACTOBACILLUS SPECIES: NOT DETECTED Log (cells/mL)
MEGASPHAERA SPECIES: NOT DETECTED Log (cells/mL)
N. GONORRHOEAE RNA, TMA: NOT DETECTED
T. vaginalis RNA, QL TMA: NOT DETECTED

## 2015-02-05 ENCOUNTER — Other Ambulatory Visit: Payer: Self-pay | Admitting: Certified Nurse Midwife

## 2015-02-07 ENCOUNTER — Encounter: Payer: No Typology Code available for payment source | Admitting: Certified Nurse Midwife

## 2015-02-08 ENCOUNTER — Ambulatory Visit (INDEPENDENT_AMBULATORY_CARE_PROVIDER_SITE_OTHER): Payer: 59 | Admitting: Certified Nurse Midwife

## 2015-02-08 ENCOUNTER — Encounter: Payer: Self-pay | Admitting: Certified Nurse Midwife

## 2015-02-08 ENCOUNTER — Encounter (INDEPENDENT_AMBULATORY_CARE_PROVIDER_SITE_OTHER): Payer: Self-pay

## 2015-02-08 VITALS — BP 134/89 | HR 90 | Temp 97.6°F | Wt 258.0 lb

## 2015-02-08 DIAGNOSIS — R7989 Other specified abnormal findings of blood chemistry: Secondary | ICD-10-CM | POA: Diagnosis not present

## 2015-02-08 DIAGNOSIS — O26812 Pregnancy related exhaustion and fatigue, second trimester: Secondary | ICD-10-CM | POA: Diagnosis not present

## 2015-02-08 DIAGNOSIS — O99341 Other mental disorders complicating pregnancy, first trimester: Secondary | ICD-10-CM | POA: Diagnosis not present

## 2015-02-08 DIAGNOSIS — O219 Vomiting of pregnancy, unspecified: Secondary | ICD-10-CM | POA: Insufficient documentation

## 2015-02-08 DIAGNOSIS — F329 Major depressive disorder, single episode, unspecified: Secondary | ICD-10-CM | POA: Insufficient documentation

## 2015-02-08 DIAGNOSIS — O99019 Anemia complicating pregnancy, unspecified trimester: Secondary | ICD-10-CM | POA: Insufficient documentation

## 2015-02-08 DIAGNOSIS — F32A Depression, unspecified: Secondary | ICD-10-CM | POA: Insufficient documentation

## 2015-02-08 DIAGNOSIS — O99340 Other mental disorders complicating pregnancy, unspecified trimester: Secondary | ICD-10-CM | POA: Diagnosis not present

## 2015-02-08 LAB — POCT URINALYSIS DIPSTICK
Bilirubin, UA: NEGATIVE
Glucose, UA: NEGATIVE
Ketones, UA: NEGATIVE
Leukocytes, UA: NEGATIVE
Nitrite, UA: NEGATIVE
PH UA: 7
Protein, UA: NEGATIVE
RBC UA: NEGATIVE
SPEC GRAV UA: 1.015
UROBILINOGEN UA: NEGATIVE

## 2015-02-08 LAB — T4, FREE: Free T4: 1.06 ng/dL (ref 0.80–1.80)

## 2015-02-08 LAB — FERRITIN: Ferritin: 8 ng/mL — ABNORMAL LOW (ref 10–291)

## 2015-02-08 MED ORDER — ONDANSETRON HCL 4 MG PO TABS: 4.0000 mg | ORAL_TABLET | Freq: Every day | ORAL | Status: DC | PRN

## 2015-02-08 MED ORDER — OB COMPLETE GOLD 27.5-1-200 MG PO CAPS: 1.0000 | ORAL_CAPSULE | Freq: Every day | ORAL | Status: DC

## 2015-02-08 MED ORDER — SERTRALINE HCL 50 MG PO TABS: 50.0000 mg | ORAL_TABLET | Freq: Every day | ORAL | Status: DC

## 2015-02-08 NOTE — Addendum Note (Signed)
Addended by: Henriette CombsHATTON, Gevorg Brum L on: 02/08/2015 03:51 PM   Modules accepted: Orders

## 2015-02-08 NOTE — Progress Notes (Signed)
  Subjective:    Marisa Yu is a 29 y.o. female being seen today for her obstetrical visit. She is at 733w2d gestation. Patient reports: nausea, no bleeding, no contractions, no cramping, no leaking, vomiting and depression.  States she has emesis daily. Also reports extreme fatigue.  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    Problem List Items Addressed This Visit    None    Visit Diagnoses    Anemia affecting pregnancy    -  Primary    Relevant Medications    Prenat w/o A-FeCbn-Meth-FA-DHA (OB COMPLETE GOLD) 27.5-1-200 MG CAPS    Other Relevant Orders    IBC panel    Ferritin    Abnormal thyroid stimulating hormone (TSH) level        Relevant Orders    Thyroid Panel With TSH    T4, free    Pregnancy related fatigue in second trimester        Relevant Medications    Prenat w/o A-FeCbn-Meth-FA-DHA (OB COMPLETE GOLD) 27.5-1-200 MG CAPS    Other Relevant Orders    Thyroid Panel With TSH    T4, free    IBC panel    Ferritin    Nausea and vomiting in pregnancy prior to [redacted] weeks gestation        Relevant Medications    ondansetron (ZOFRAN) 4 MG tablet    Depression affecting pregnancy in first trimester, antepartum        Relevant Medications    sertraline (ZOLOFT) 50 MG tablet      Patient Active Problem List   Diagnosis Date Noted  . Supervision of normal pregnancy in first trimester 01/29/2015  . Carbuncle and furuncle of buttock 01/30/2013  . BV (bacterial vaginosis) 01/30/2013  . Excessive or frequent menstruation 01/30/2013    Objective:     BP 134/89 mmHg  Pulse 90  Temp(Src) 97.6 F (36.4 C)  Wt 117.028 kg (258 lb)  LMP 11/21/2014 Uterine Size: Below umbilicus   FHR: 154. Depression score: 12  Assessment:    Pregnancy @ 4933w2d  weeks N&V affecting pregnancy Depression Anemia Abnormal TSH levels in pregnancy     Plan:    Problem list reviewed and updated. Labs reviewed. Follow up in 4 weeks. FIRST/CF mutation  testing/NIPT/QUAD SCREEN/fragile X/[name] population testing/Spinal muscular atrophy discussed: requested. Role of ultrasound in pregnancy discussed; fetal survey: requested. Amniocentesis discussed: not indicated. 50% of 15 minute visit spent on counseling and coordination of care.

## 2015-02-08 NOTE — Patient Instructions (Signed)
Depression Depression refers to feeling sad, low, down in the dumps, blue, gloomy, or empty. In general, there are two kinds of depression:  Normal sadness or normal grief. This kind of depression is one that we all feel from time to time after upsetting life experiences, such as the loss of a job or the ending of a relationship. This kind of depression is considered normal, is short lived, and resolves within a few days to 2 weeks. Depression experienced after the loss of a loved one (bereavement) often lasts longer than 2 weeks but normally gets better with time.  Clinical depression. This kind of depression lasts longer than normal sadness or normal grief or interferes with your ability to function at home, at work, and in school. It also interferes with your personal relationships. It affects almost every aspect of your life. Clinical depression is an illness. Symptoms of depression can also be caused by conditions other than those mentioned above, such as:  Physical illness. Some physical illnesses, including underactive thyroid gland (hypothyroidism), severe anemia, specific types of cancer, diabetes, uncontrolled seizures, heart and lung problems, strokes, and chronic pain are commonly associated with symptoms of depression.  Side effects of some prescription medicine. In some people, certain types of medicine can cause symptoms of depression.  Substance abuse. Abuse of alcohol and illicit drugs can cause symptoms of depression. SYMPTOMS Symptoms of normal sadness and normal grief include the following:  Feeling sad or crying for short periods of time.  Not caring about anything (apathy).  Difficulty sleeping or sleeping too much.  No longer able to enjoy the things you used to enjoy.  Desire to be by oneself all the time (social isolation).  Lack of energy or motivation.  Difficulty concentrating or remembering.  Change in appetite or weight.  Restlessness or  agitation. Symptoms of clinical depression include the same symptoms of normal sadness or normal grief and also the following symptoms:  Feeling sad or crying all the time.  Feelings of guilt or worthlessness.  Feelings of hopelessness or helplessness.  Thoughts of suicide or the desire to harm yourself (suicidal ideation).  Loss of touch with reality (psychotic symptoms). Seeing or hearing things that are not real (hallucinations) or having false beliefs about your life or the people around you (delusions and paranoia). DIAGNOSIS  The diagnosis of clinical depression is usually based on how bad the symptoms are and how long they have lasted. Your health care provider will also ask you questions about your medical history and substance use to find out if physical illness, use of prescription medicine, or substance abuse is causing your depression. Your health care provider may also order blood tests. TREATMENT  Often, normal sadness and normal grief do not require treatment. However, sometimes antidepressant medicine is given for bereavement to ease the depressive symptoms until they resolve. The treatment for clinical depression depends on how bad the symptoms are but often includes antidepressant medicine, counseling with a mental health professional, or both. Your health care provider will help to determine what treatment is best for you. Depression caused by physical illness usually goes away with appropriate medical treatment of the illness. If prescription medicine is causing depression, talk with your health care provider about stopping the medicine, decreasing the dose, or changing to another medicine. Depression caused by the abuse of alcohol or illicit drugs goes away when you stop using these substances. Some adults need professional help in order to stop drinking or using drugs. [place]  CARE IF:  You have thoughts about hurting yourself or others.  You lose touch  with reality (have psychotic symptoms).  You are taking medicine for depression and have a serious side effect. FOR MORE INFORMATION  National Alliance on Mental Illness: www.nami.[employer] of Mental Health: http://www.maynard.net/ Document Released: 09/04/2000 Document Revised: 01/22/2014 Document Reviewed: 12/07/2011 [facility] Patient Information 2015 [place], [place]. This information is not intended to replace advice given to you by your health care provider. Make sure you discuss any questions you have with your health care provider. Morning Sickness Morning sickness is when you feel sick to your stomach (nauseous) during pregnancy. This nauseous feeling may or may not come with vomiting. It often occurs in the morning but can be a problem any time of day. Morning sickness is most common during the first trimester, but it may continue throughout pregnancy. While morning sickness is unpleasant, it is usually harmless unless you develop severe and continual vomiting (hyperemesis gravidarum). This condition requires more intense treatment.  CAUSES  The cause of morning sickness is not completely known but seems to be related to normal hormonal changes that occur in pregnancy. RISK FACTORS You are at greater risk if you:  Experienced nausea or vomiting before your pregnancy.  Had morning sickness during a previous pregnancy.  Are pregnant with more than one baby, such as twins. TREATMENT  Do not use any medicines (prescription, over-the-counter, or herbal) for morning sickness without first talking to your health care provider. Your health care provider may prescribe or recommend:  Vitamin B6 supplements.  Anti-nausea medicines.  The herbal medicine ginger. HOME CARE INSTRUCTIONS   Only take over-the-counter or prescription medicines as directed by your health care provider.  Taking multivitamins before getting pregnant can prevent or decrease the severity of morning  sickness in most women.  Eat a piece of dry toast or unsalted crackers before getting out of bed in the morning.  Eat five or six small meals a day.  Eat dry and bland foods (rice, baked potato). Foods high in carbohydrates are often helpful.  Do not drink liquids with your meals. Drink liquids between meals.  Avoid greasy, fatty, and spicy foods.  Get someone to cook for you if the smell of any food causes nausea and vomiting.  If you feel nauseous after taking prenatal vitamins, take the vitamins at night or with a snack.  Snack on protein foods (nuts, yogurt, cheese) between meals if you are hungry.  Eat unsweetened gelatins for desserts.  Wearing an acupressure wristband (worn for sea sickness) may be helpful.  Acupuncture may be helpful.  Do not smoke.  Get a humidifier to keep the air in your house free of odors.  Get plenty of fresh air. SEEK MEDICAL CARE IF:   Your home remedies are not working, and you need medicine.  You feel dizzy or lightheaded.  You are losing weight. SEEK IMMEDIATE MEDICAL CARE IF:   You have persistent and uncontrolled nausea and vomiting.  You pass out (faint). MAKE SURE YOU:  Understand these instructions.  Will watch your condition.  Will get help right away if you are not doing well or get worse. Document Released: 10/29/2006 Document Revised: 09/12/2013 Document Reviewed: 02/22/2013 [facility] Patient Information 2015 [place], [place]. This information is not intended to replace advice given to you by your health care provider. Make sure you discuss any questions you have with your health care provider. Pregnancy and Anemia Anemia is a condition in which the  concentration of red blood cells or hemoglobin in the blood is below normal. Hemoglobin is a substance in red blood cells that carries oxygen to the tissues of the body. Anemia results in not enough oxygen reaching these tissues.  Anemia during pregnancy is common because the  fetus uses more iron and folic acid as it is developing. Your body may not produce enough red blood cells because of this. Also, during pregnancy, the liquid part of the blood (plasma) increases by about 50%, and the red blood cells increase by only 25%. This lowers the concentration of the red blood cells and creates a natural anemia-like situation.  CAUSES  The most common cause of anemia during pregnancy is not having enough iron in the body to make red blood cells (iron deficiency anemia). Other causes may include:  Folic acid deficiency.  Vitamin B12 deficiency.  Certain prescription or over-the-counter medicines.  Certain medical conditions or infections that destroy red blood cells.  A low platelet count and bleeding caused by antibodies that go through the placenta to the fetus from the mother's blood. SIGNS AND SYMPTOMS  Mild anemia may not be noticeable. If it becomes severe, symptoms may include:  Tiredness.  Shortness of breath, especially with exercise.  Weakness.  Fainting.  Pale looking skin.  Headaches.  Feeling a fast or irregular heartbeat (palpitations). DIAGNOSIS  The type of anemia is usually diagnosed from your family and medical history and blood tests. TREATMENT  Treatment of anemia during pregnancy depends on the cause of the anemia. Treatment can include:  Supplements of iron, vitamin B12, or folic acid.  A blood transfusion. This may be needed if blood loss is severe.  Hospitalization. This may be needed if there is significant continual blood loss.  Dietary changes. HOME CARE INSTRUCTIONS   Follow your dietitian's or health care provider's dietary recommendations.  Increase your vitamin C intake. This will help the stomach absorb more iron.  Eat a diet rich in iron. This would include foods such as:  Liver.  Beef.  Whole grain bread.  Eggs.  Dried fruit.  Take iron and vitamins as directed by your health care provider.  Eat  green leafy vegetables. These are a good source of folic acid. SEEK MEDICAL CARE IF:   You have frequent or lasting headaches.  You are looking pale.  You are bruising easily. SEEK IMMEDIATE MEDICAL CARE IF:   You have extreme weakness, shortness of breath, or chest pain.  You become dizzy or have trouble concentrating.  You have heavy vaginal bleeding.  You develop a rash.  You have bloody or black, tarry stools.  You faint.  You vomit up blood.  You vomit repeatedly.  You have abdominal pain.  You have a fever or persistent symptoms for more than 2-3 days.  You have a fever and your symptoms suddenly get worse.  You are dehydrated. MAKE SURE YOU:   Understand these instructions.  Will watch your condition.  Will get help right away if you are not doing well or get worse. Document Released: 09/04/2000 Document Revised: 06/28/2013 Document Reviewed: 04/19/2013 [facility] Patient Information 2015 [place]. This information is not intended to replace advice given to you by your health care provider. Make sure you discuss any questions you have with your health care provider. First Trimester of Pregnancy The first trimester of pregnancy is from week 1 until the end of week 12 (months 1 through 3). A week after a sperm fertilizes an egg, the egg  will implant on the wall of the uterus. This embryo will begin to develop into a baby. Genes from you and your partner are forming the baby. The female genes determine whether the baby is a boy or a girl. At 6-8 weeks, the eyes and face are formed, and the heartbeat can be seen on ultrasound. At the end of 12 weeks, all the baby's organs are formed.  Now that you are pregnant, you will want to do everything you can to have a healthy baby. Two of the most important things are to get good prenatal care and to follow your health care provider's instructions. Prenatal care is all the medical care you receive before the baby's birth.  This care will help prevent, find, and treat any problems during the pregnancy and childbirth. BODY CHANGES Your body goes through many changes during pregnancy. The changes vary from woman to woman.   You may gain or lose a couple of pounds at first.  You may feel sick to your stomach (nauseous) and throw up (vomit). If the vomiting is uncontrollable, call your health care provider.  You may tire easily.  You may develop headaches that can be relieved by medicines approved by your health care provider.  You may urinate more often. Painful urination may mean you have a bladder infection.  You may develop heartburn as a result of your pregnancy.  You may develop constipation because certain hormones are causing the muscles that push waste through your intestines to slow down.  You may develop hemorrhoids or swollen, bulging veins (varicose veins).  Your breasts may begin to grow larger and become tender. Your nipples may stick out more, and the tissue that surrounds them (areola) may become darker.  Your gums may bleed and may be sensitive to brushing and flossing.  Dark spots or blotches (chloasma, mask of pregnancy) may develop on your face. This will likely fade after the baby is born.  Your menstrual periods will stop.  You may have a loss of appetite.  You may develop cravings for certain kinds of food.  You may have changes in your emotions from day to day, such as being excited to be pregnant or being concerned that something may go wrong with the pregnancy and baby.  You may have more vivid and strange dreams.  You may have changes in your hair. These can include thickening of your hair, rapid growth, and changes in texture. Some women also have hair loss during or after pregnancy, or hair that feels dry or thin. Your hair will most likely return to normal after your baby is born. WHAT TO EXPECT AT YOUR PRENATAL VISITS During a routine prenatal visit:  You will be  weighed to make sure you and the baby are growing normally.  Your blood pressure will be taken.  Your abdomen will be measured to track your baby's growth.  The fetal heartbeat will be listened to starting around week 10 or 12 of your pregnancy.  Test results from any previous visits will be discussed. Your health care provider may ask you:  How you are feeling.  If you are feeling the baby move.  If you have had any abnormal symptoms, such as leaking fluid, bleeding, severe headaches, or abdominal cramping.  If you have any questions. Other tests that may be performed during your first trimester include:  Blood tests to find your blood type and to check for the presence of any previous infections. They will also be used  to check for low iron levels (anemia) and Rh antibodies. Later in the pregnancy, blood tests for diabetes will be done along with other tests if problems develop.  Urine tests to check for infections, diabetes, or protein in the urine.  An ultrasound to confirm the proper growth and development of the baby.  An amniocentesis to check for possible genetic problems.  Fetal screens for spina bifida and Down syndrome.  You may need other tests to make sure you and the baby are doing well. HOME CARE INSTRUCTIONS  Medicines  Follow your health care provider's instructions regarding medicine use. Specific medicines may be either safe or unsafe to take during pregnancy.  Take your prenatal vitamins as directed.  If you develop constipation, try taking a stool softener if your health care provider approves. Diet  Eat regular, well-balanced meals. Choose a variety of foods, such as meat or vegetable-based protein, fish, milk and low-fat dairy products, vegetables, fruits, and whole grain breads and cereals. Your health care provider will help you determine the amount of weight gain that is right for you.  Avoid raw meat and uncooked cheese. These carry germs that can  cause birth defects in the baby.  Eating four or five small meals rather than three large meals a day may help relieve nausea and vomiting. If you start to feel nauseous, eating a few soda crackers can be helpful. Drinking liquids between meals instead of during meals also seems to help nausea and vomiting.  If you develop constipation, eat more high-fiber foods, such as fresh vegetables or fruit and whole grains. Drink enough fluids to keep your urine clear or pale yellow. Activity and Exercise  Exercise only as directed by your health care provider. Exercising will help you:  Control your weight.  Stay in shape.  Be prepared for labor and delivery.  Experiencing pain or cramping in the lower abdomen or low back is a good sign that you should stop exercising. Check with your health care provider before continuing normal exercises.  Try to avoid standing for long periods of time. Move your legs often if you must stand in one place for a long time.  Avoid heavy lifting.  Wear low-heeled shoes, and practice good posture.  You may continue to have sex unless your health care provider directs you otherwise. Relief of Pain or Discomfort  Wear a good support bra for breast tenderness.   Take warm sitz baths to soothe any pain or discomfort caused by hemorrhoids. Use hemorrhoid cream if your health care provider approves.   Rest with your legs elevated if you have leg cramps or low back pain.  If you develop varicose veins in your legs, wear support hose. Elevate your feet for 15 minutes, 3-4 times a day. Limit salt in your diet. Prenatal Care  Schedule your prenatal visits by the twelfth week of pregnancy. They are usually scheduled monthly at first, then more often in the last 2 months before delivery.  Write down your questions. Take them to your prenatal visits.  Keep all your prenatal visits as directed by your health care provider. Safety  Wear your seat belt at all times  when driving.  Make a list of emergency phone numbers, including numbers for family, friends, the hospital, and police and fire departments. General Tips  Ask your health care provider for a referral to a local prenatal education class. Begin classes no later than at the beginning of month 6 of your pregnancy.  Ask for  help if you have counseling or nutritional needs during pregnancy. Your health care provider can offer advice or refer you to specialists for help with various needs.  Do not use hot tubs, steam rooms, or saunas.  Do not douche or use tampons or scented sanitary pads.  Do not cross your legs for long periods of time.  Avoid cat litter boxes and soil used by cats. These carry germs that can cause birth defects in the baby and possibly loss of the fetus by miscarriage or stillbirth.  Avoid all smoking, herbs, alcohol, and medicines not prescribed by your health care provider. Chemicals in these affect the formation and growth of the baby.  Schedule a dentist appointment. At home, brush your teeth with a soft toothbrush and be gentle when you floss. SEEK MEDICAL CARE IF:   You have dizziness.  You have mild pelvic cramps, pelvic pressure, or nagging pain in the abdominal area.  You have persistent nausea, vomiting, or diarrhea.  You have a bad smelling vaginal discharge.  You have pain with urination.  You notice increased swelling in your face, hands, legs, or ankles. SEEK IMMEDIATE MEDICAL CARE IF:   You have a fever.  You are leaking fluid from your vagina.  You have spotting or bleeding from your vagina.  You have severe abdominal cramping or pain.  You have rapid weight gain or loss.  You vomit blood or material that looks like coffee grounds.  You are exposed to [place] measles and have never had them.  You are exposed to fifth disease or chickenpox.  You develop a severe headache.  You have shortness of breath.  You have any kind of trauma,  such as from a fall or a car accident. Document Released: 09/01/2001 Document Revised: 01/22/2014 Document Reviewed: 07/18/2013 [facility] Patient Information 2015 [place], [place]. This information is not intended to replace advice given to you by your health care provider. Make sure you discuss any questions you have with your health care provider.

## 2015-02-13 ENCOUNTER — Other Ambulatory Visit: Payer: Self-pay | Admitting: Certified Nurse Midwife

## 2015-02-13 DIAGNOSIS — O99011 Anemia complicating pregnancy, first trimester: Principal | ICD-10-CM

## 2015-02-13 DIAGNOSIS — D509 Iron deficiency anemia, unspecified: Secondary | ICD-10-CM

## 2015-02-13 LAB — IBC PANEL
%SAT: 7 % — AB (ref 20–55)
TIBC: 430 ug/dL (ref 250–470)
UIBC: 399 ug/dL (ref 125–400)

## 2015-02-13 LAB — THYROID PANEL WITH TSH
Free Thyroxine Index: 2.6 (ref 1.4–3.8)
T3 Uptake: 22 % (ref 22–35)
T4, Total: 11.6 ug/dL (ref 4.5–12.0)
TSH: 0.115 u[IU]/mL — ABNORMAL LOW (ref 0.350–4.500)

## 2015-02-13 LAB — IRON: Iron: 31 ug/dL — ABNORMAL LOW (ref 42–145)

## 2015-02-14 ENCOUNTER — Telehealth: Payer: Self-pay | Admitting: Oncology

## 2015-02-14 NOTE — Telephone Encounter (Signed)
new patient appt-s/w patient and gave np appt for 05/27 @ 10:30 w/Dr. Clelia CroftShadad.  Referring [place]le Denney Dx-severe IDA

## 2015-02-15 ENCOUNTER — Other Ambulatory Visit: Payer: 59

## 2015-02-15 ENCOUNTER — Ambulatory Visit: Payer: 59

## 2015-02-15 ENCOUNTER — Ambulatory Visit (HOSPITAL_BASED_OUTPATIENT_CLINIC_OR_DEPARTMENT_OTHER): Payer: 59 | Admitting: Oncology

## 2015-02-15 VITALS — BP 132/81 | HR 88 | Temp 98.1°F | Resp 18 | Ht 65.0 in | Wt 254.7 lb

## 2015-02-15 DIAGNOSIS — D509 Iron deficiency anemia, unspecified: Secondary | ICD-10-CM | POA: Diagnosis not present

## 2015-02-15 DIAGNOSIS — O99012 Anemia complicating pregnancy, second trimester: Secondary | ICD-10-CM

## 2015-02-15 DIAGNOSIS — O99019 Anemia complicating pregnancy, unspecified trimester: Secondary | ICD-10-CM

## 2015-02-15 DIAGNOSIS — F329 Major depressive disorder, single episode, unspecified: Secondary | ICD-10-CM | POA: Diagnosis not present

## 2015-02-15 DIAGNOSIS — R110 Nausea: Secondary | ICD-10-CM | POA: Diagnosis not present

## 2015-02-15 NOTE — Consult Note (Signed)
Reason for Referral: Iron deficiency anemia.   HPI: 28 year old woman currently of [place] where she lived the majority of her life. He is a rather healthy woman with history of hypertension and mild depression. She is currently [redacted] weeks pregnant with an unexpected to date around December. That is her third pregnancy and the previous 2 pregnancies required C-sections. She was noted during this pregnancy to be slightly more fatigued and was noted to be mildly anemic. Her most recent CBC on 01/29/2015 showed a hemoglobin of 10.9, white cell 7.0 and platelet count of 360. Her MCV was 76.4. Hemoglobin electrophoresis was normal. Her iron studies showed iron level of 31 and saturation of 7% and a ferritin of 8. All these levels are low. She was started on iron supplements which she is taking since last week. She reports that she is taking it every other day when she remembers. She does not report complications from it including constipation, dyspepsia or abdominal pain. She has reported nausea but that have been noted prior to her starting iron. She does not report any bleeding at this time. She does not report any GI, GYN or GU bleeding. 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  She does not report any headaches, blurry vision, syncope or seizures. She does not report any fevers or chills or sweats. She does not report any cough or hemoptysis or hematemesis. She does not report any abdominal pain, rales satiety or diarrhea. She does not report any frequency urgency or hesitancy. She does not report any skeletal complaints. Remainder review of systems unremarkable.   Past Medical History  Diagnosis Date  . Hypertension   . Headache   . Vaginal Pap smear, abnormal   . Boil, arm   . Tooth ache   :  Past Surgical History  Procedure Laterality Date  . Cesarean section  2004  . Cesarean section  2009  . Cyst excision    :   Current outpatient prescriptions:  .  Doxylamine-Pyridoxine  (DICLEGIS) 10-10 MG TBEC, Take 1 tablet by mouth 3 (three) times daily. (Patient not taking: Reported on 02/08/2015), Disp: 100 tablet, Rfl: 3 .  metroNIDAZOLE (METROGEL VAGINAL) 0.75 % vaginal gel, Place 1 Applicatorful vaginally 2 (two) times daily. (Patient not taking: Reported on 02/08/2015), Disp: 70 g, Rfl: 0 .  ondansetron (ZOFRAN) 4 MG tablet, Take 1 tablet (4 mg total) by mouth daily as needed for nausea or vomiting., Disp: 30 tablet, Rfl: 1 .  Prenat w/o A-FeCbn-Meth-FA-DHA (OB COMPLETE GOLD) 27.5-1-200 MG CAPS, Take 1 tablet by mouth daily., Disp: 30 capsule, Rfl: 12 .  Prenatal Vit-Fe Fumarate-FA (PRENATAL PO), Take by mouth., Disp: , Rfl:  .  sertraline (ZOLOFT) 50 MG tablet, Take 1 tablet (50 mg total) by mouth daily., Disp: 30 tablet, Rfl: 2:  Allergies  Allergen Reactions  . Codeine Itching  :  Family History  Problem Relation Age of Onset  . Cancer Maternal Aunt     Breast  . Cancer Maternal Grandmother     Lung  . Cancer Maternal Aunt     Cervical  . Heart disease Maternal Grandfather   :  History   Social History  . Marital Status: Single    Spouse Name: N/A  . Number of Children: 2  . Years of Education: N/A   Occupational History  . Childcare    Social History Main Topics  . Smoking status: Former Smoker    Types: Cigarettes    Quit date: 09/22/2011  .  Smokeless tobacco: Never Used  . Alcohol Use: No  . Drug Use: No  . Sexual Activity:    Partners: Male    Birth Control/ Protection: None   Other Topics Concern  . Not on file   Social History Narrative  :  Pertinent items are noted in HPI.  Exam: ECOG 0 Last menstrual period 11/21/2014. General appearance: alert and cooperative Head: Normocephalic, without obvious abnormality Throat: lips, mucosa, and tongue normal; teeth and gums normal Neck: no adenopathy Back: negative Resp: clear to auscultation bilaterally Chest wall: no tenderness Cardio: regular rate and rhythm, S1, S2 normal, no  murmur, click, rub or gallop GI: soft, non-tender; bowel sounds normal; no masses,  no organomegaly Extremities: extremities normal, atraumatic, no cyanosis or edema Pulses: 2+ and symmetric Skin: Skin color, texture, turgor normal. No rashes or lesions Lymph nodes: Cervical, supraclavicular, and axillary nodes normal.  CBC    Component Value Date/Time   WBC 7.0 01/29/2015 1358   RBC 4.50 01/29/2015 1358   HGB 10.9* 01/29/2015 1358   HCT 34.4* 01/29/2015 1358   PLT 360 01/29/2015 1358   MCV 76.4* 01/29/2015 1358   MCH 24.2* 01/29/2015 1358   MCHC 31.7 01/29/2015 1358   RDW 16.7* 01/29/2015 1358   LYMPHSABS 1.8 01/29/2015 1358   MONOABS 0.6 01/29/2015 1358   EOSABS 0.1 01/29/2015 1358   BASOSABS 0.0 01/29/2015 1358      Chemistry      Component Value Date/Time   NA 137 12/19/2014 1036   K 3.9 12/19/2014 1036   CL 111 12/19/2014 1036   CO2 21 12/19/2014 1036   BUN 7 12/19/2014 1036   CREATININE 0.66 12/19/2014 1036      Component Value Date/Time   CALCIUM 8.9 12/19/2014 1036     Results for ABRIA, VANNOSTRAND (MRN [id number]) as of 02/15/2015 11:57  Ref. Range 02/08/2015 10:49  Iron Latest Ref Range: 42-145 ug/dL 31 (L)  UIBC Latest Ref Range: 125-400 ug/dL 119  TIBC Latest Ref Range: 250-470 ug/dL 147  %SAT Latest Ref Range: 20-55 % 7 (L)  Ferritin Latest Ref Range: 10-291 ng/mL 8 (L)    Assessment and Plan:   29 year old woman with the following issues:  1. Iron deficiency anemia presented with a hemoglobin of 10.9, MCV of 76.4 and iron levels of 31 and ferritin of 8. This is associated with her pregnancy now she is 12 weeks into it. She did develop iron deficiency associated with her previous pregnancy as evident by her hemoglobin down to 9.2 in May 2009. Her MCV at that time was 77. She never really needed packed red cell transfusion or IV iron at any point. Her hemoglobin electrophoresis did not show any evidence of hemoglobinopathy.  She is currently on oral iron  and was started last week and she admits to taking it every other day.  I have recommended continuing oral iron for the time being given the mild anemia that she is experiencing and the fact that she have tolerated oral iron without any complications. I have encouraged her to adhere to the iron supplements on a daily basis and we will recheck her iron stores in 3 months make sure she is adequately maintained.  I also discussed with her the role of IV iron at this time. I feel there is no need for this modality given the mild nature of her anemia and the fact that she is tolerating iron orally very well. I also explained to her the complications associated with  IV iron that includes infusion related toxicity and rarely anaphylaxis. Isolated her that if this is to be done in these to be done with fetal monitoring given the risk of fetal loss associated with anaphylaxis. This certainly [facility] be [facility] at [facility] under the supervision of her gynecologist. I do not think this is needed at this time as mentioned given her mild anemia.  2. Nausea, fatigue and depression: I do think anemia is contributing but is not the only factor. He state among other factors likely contributing to these symptoms. Her hemoglobin was lower during her last pregnancy and certainly he did not have the same symptoms at that time. She is following up with her gynecologist regarding these issues.  All her questions were answered today.

## 2015-02-15 NOTE — Progress Notes (Signed)
Please see consult note.  

## 2015-02-19 NOTE — Telephone Encounter (Signed)
Pt has been seen in office since call. Call closed.

## 2015-02-28 ENCOUNTER — Inpatient Hospital Stay (HOSPITAL_COMMUNITY)
Admission: AD | Admit: 2015-02-28 | Discharge: 2015-02-28 | Disposition: A | Discharge status: Home / Self Care | Payer: 59 | Source: Ambulatory Visit | Attending: Obstetrics | Admitting: Obstetrics

## 2015-02-28 ENCOUNTER — Encounter (HOSPITAL_COMMUNITY): Payer: Self-pay | Admitting: *Deleted

## 2015-02-28 DIAGNOSIS — R102 Pelvic and perineal pain: Secondary | ICD-10-CM | POA: Diagnosis not present

## 2015-02-28 DIAGNOSIS — B373 Candidiasis of vulva and vagina: Secondary | ICD-10-CM | POA: Diagnosis not present

## 2015-02-28 DIAGNOSIS — Z87891 Personal history of nicotine dependence: Secondary | ICD-10-CM | POA: Diagnosis not present

## 2015-02-28 DIAGNOSIS — O9989 Other specified diseases and conditions complicating pregnancy, childbirth and the puerperium: Secondary | ICD-10-CM | POA: Diagnosis not present

## 2015-02-28 DIAGNOSIS — O98812 Other maternal infectious and parasitic diseases complicating pregnancy, second trimester: Secondary | ICD-10-CM | POA: Diagnosis not present

## 2015-02-28 DIAGNOSIS — R1030 Lower abdominal pain, unspecified: Secondary | ICD-10-CM | POA: Diagnosis present

## 2015-02-28 DIAGNOSIS — O26899 Other specified pregnancy related conditions, unspecified trimester: Secondary | ICD-10-CM

## 2015-02-28 DIAGNOSIS — Z3A14 14 weeks gestation of pregnancy: Secondary | ICD-10-CM | POA: Insufficient documentation

## 2015-02-28 DIAGNOSIS — R109 Unspecified abdominal pain: Secondary | ICD-10-CM

## 2015-02-28 DIAGNOSIS — B3731 Acute candidiasis of vulva and vagina: Secondary | ICD-10-CM

## 2015-02-28 LAB — CBC WITH DIFFERENTIAL/PLATELET
Basophils Absolute: 0 10*3/uL (ref 0.0–0.1)
Basophils Relative: 0 % (ref 0–1)
EOS ABS: 0.1 10*3/uL (ref 0.0–0.7)
EOS PCT: 2 % (ref 0–5)
HEMATOCRIT: 33.2 % — AB (ref 36.0–46.0)
HEMOGLOBIN: 10.7 g/dL — AB (ref 12.0–15.0)
LYMPHS ABS: 1.3 10*3/uL (ref 0.7–4.0)
Lymphocytes Relative: 20 % (ref 12–46)
MCH: 24.5 pg — ABNORMAL LOW (ref 26.0–34.0)
MCHC: 32.2 g/dL (ref 30.0–36.0)
MCV: 76.1 fL — ABNORMAL LOW (ref 78.0–100.0)
MONO ABS: 0.5 10*3/uL (ref 0.1–1.0)
Monocytes Relative: 7 % (ref 3–12)
NEUTROS ABS: 4.4 10*3/uL (ref 1.7–7.7)
Neutrophils Relative %: 71 % (ref 43–77)
PLATELETS: 323 10*3/uL (ref 150–400)
RBC: 4.36 MIL/uL (ref 3.87–5.11)
RDW: 15.6 % — AB (ref 11.5–15.5)
WBC: 6.3 10*3/uL (ref 4.0–10.5)

## 2015-02-28 LAB — URINALYSIS, ROUTINE W REFLEX MICROSCOPIC
BILIRUBIN URINE: NEGATIVE
Glucose, UA: NEGATIVE mg/dL
Hgb urine dipstick: NEGATIVE
Ketones, ur: NEGATIVE mg/dL
LEUKOCYTES UA: NEGATIVE
Nitrite: NEGATIVE
PH: 6 (ref 5.0–8.0)
Protein, ur: NEGATIVE mg/dL
SPECIFIC GRAVITY, URINE: 1.025 (ref 1.005–1.030)
Urobilinogen, UA: 0.2 mg/dL (ref 0.0–1.0)

## 2015-02-28 LAB — WET PREP, GENITAL
CLUE CELLS WET PREP: NONE SEEN
Trich, Wet Prep: NONE SEEN

## 2015-02-28 MED ORDER — FLUCONAZOLE 150 MG PO TABS: 150.0000 mg | ORAL_TABLET | Freq: Every day | ORAL | Status: DC

## 2015-02-28 MED ORDER — FLUCONAZOLE 150 MG PO TABS
150.0000 mg | ORAL_TABLET | Freq: Once | ORAL | Status: AC
  Administered 2015-02-28: 150 mg via ORAL
  Filled 2015-02-28: qty 1

## 2015-02-28 NOTE — MAU Provider Note (Signed)
History     CSN: [id number]  Arrival date and time: 02/28/15 1152   First Provider Initiated Contact with Patient 02/28/15 1410      Chief Complaint  Patient presents with  . Abdominal Pain   HPI  Marisa Yu is a 29 y.o. G3P2002 at [redacted]w[redacted]d who presents to MAU today with complaint of lower abdominal pain at midline x 2 weeks. The patient also states a small amount of creamy white discharge. She states that she has been taking Metrogel for BV x 1 month nightly. She denies vaginal bleeding, UTI symptoms, fever, N/V/D or recent intercourse. She states pain is 10/10 although appears very comfortable. She took Tylenol at 0730 today with minimal relief. She states mild constipation, last BM was 3 days ago.   OB History    Gravida Para Term Preterm AB TAB SAB Ectopic Multiple Living   3 2 2  0 0 0 0 0 0 2      Past Medical History  Diagnosis Date  . Hypertension   . Headache   . Vaginal Pap smear, abnormal   . Boil, arm   . Tooth ache     Past Surgical History  Procedure Laterality Date  . Cesarean section  2004  . Cesarean section  2009  . Cyst excision      Family History  Problem Relation Age of Onset  . Cancer Maternal Aunt     Breast  . Cancer Maternal Grandmother     Lung  . Cancer Maternal Aunt     Cervical  . Heart disease Maternal Grandfather     History  Substance Use Topics  . Smoking status: Former Smoker    Types: Cigarettes    Quit date: 09/22/2011  . Smokeless tobacco: Never Used  . Alcohol Use: No    Allergies:  Allergies  Allergen Reactions  . Codeine Itching    No prescriptions prior to admission    Review of Systems  Constitutional: Negative for fever and malaise/fatigue.  Gastrointestinal: Positive for abdominal pain. Negative for nausea, vomiting, diarrhea and constipation.  Genitourinary: Negative for dysuria, urgency and frequency.       Neg - vaginal bleeding + vaginal discharge   Physical Exam   Blood pressure 139/85,  pulse 89, temperature 98.1 F (36.7 C), temperature source Oral, resp. rate 18, height 5\' 5"  (1.651 m), weight 250 lb 8 oz (113.626 kg), last menstrual period 11/21/2014, SpO2 100 %.  Physical Exam  Nursing note and vitals reviewed. Constitutional: She is oriented to person, place, and time. She appears well-developed and well-nourished. No distress.  HENT:  Head: Normocephalic and atraumatic.  Cardiovascular: Normal rate.   Respiratory: Effort normal.  GI: Soft. She exhibits no distension and no mass. There is tenderness (mild suprapubic tenderness to palpation at midline and LLQ). There is no rebound and no guarding.  Genitourinary: Uterus is enlarged (appropriate for GA). Uterus is not tender. Cervix exhibits no motion tenderness and no discharge. No bleeding in the vagina. Vaginal discharge (copious amount of thick, white discharge noted; patient also using vaginal cream ) found.  Cervix: closed, thick, posterior  Neurological: She is alert and oriented to person, place, and time.  Skin: Skin is warm and dry. No erythema.  Psychiatric: She has a normal mood and affect.   Results for orders placed or performed during the hospital encounter of 02/28/15 (from the past 24 hour(s))  Urinalysis, Routine w reflex microscopic (not at [facility])     Status:  None   Collection Time: 02/28/15 12:13 PM  Result Value Ref Range   Color, Urine YELLOW YELLOW   APPearance CLEAR CLEAR   Specific Gravity, Urine 1.025 1.005 - 1.030   pH 6.0 5.0 - 8.0   Glucose, UA NEGATIVE NEGATIVE mg/dL   Hgb urine dipstick NEGATIVE NEGATIVE   Bilirubin Urine NEGATIVE NEGATIVE   Ketones, ur NEGATIVE NEGATIVE mg/dL   Protein, ur NEGATIVE NEGATIVE mg/dL   Urobilinogen, UA 0.2 0.0 - 1.0 mg/dL   Nitrite NEGATIVE NEGATIVE   Leukocytes, UA NEGATIVE NEGATIVE  Wet prep, genital     Status: Abnormal   Collection Time: 02/28/15  2:20 PM  Result Value Ref Range   Yeast Wet Prep HPF POC FEW (A) NONE SEEN   Trich, Wet Prep NONE  SEEN NONE SEEN   Clue Cells Wet Prep HPF POC NONE SEEN NONE SEEN   WBC, Wet Prep HPF POC FEW (A) NONE SEEN  CBC with Differential/Platelet     Status: Abnormal   Collection Time: 02/28/15  2:35 PM  Result Value Ref Range   WBC 6.3 4.0 - 10.5 K/uL   RBC 4.36 3.87 - 5.11 MIL/uL   Hemoglobin 10.7 (L) 12.0 - 15.0 g/dL   HCT 91.4 (L) 78.2 - 95.6 %   MCV 76.1 (L) 78.0 - 100.0 fL   MCH 24.5 (L) 26.0 - 34.0 pg   MCHC 32.2 30.0 - 36.0 g/dL   RDW 21.3 (H) 08.6 - 57.8 %   Platelets 323 150 - 400 K/uL   Neutrophils Relative % 71 43 - 77 %   Neutro Abs 4.4 1.7 - 7.7 K/uL   Lymphocytes Relative 20 12 - 46 %   Lymphs Abs 1.3 0.7 - 4.0 K/uL   Monocytes Relative 7 3 - 12 %   Monocytes Absolute 0.5 0.1 - 1.0 K/uL   Eosinophils Relative 2 0 - 5 %   Eosinophils Absolute 0.1 0.0 - 0.7 K/uL   Basophils Relative 0 0 - 1 %   Basophils Absolute 0.0 0.0 - 0.1 K/uL    MAU Course  Procedures None  MDM FHR - 155 bpm with doppler UA, wet prep, GC/Chlamydia, CBC and HIV today Assessment and Plan  A: SIUP at [redacted]w[redacted]d Yeast vulvovaginitis Round ligament pain  P: Discharge home Rx for Diflucan sent to patient's pharmacy Advised Tylenol PRN for pain Discussed use of warm bath/shower and abdominal binder for pain Second trimester precautions discussed Patient advised to follow-up with Dr. Clearance Coots as scheduled for routine prenatal care or sooner if symptoms worsen Patient may return to MAU as needed or if her condition were to change or worsen   Marny Lowenstein, PA-C  02/28/2015, 5:21 PM

## 2015-02-28 NOTE — Discharge Instructions (Signed)
°

## 2015-02-28 NOTE — MAU Note (Signed)
Pt reports she is having abd pain and vaginal discharge for about 1 week.

## 2015-03-01 LAB — GC/CHLAMYDIA PROBE AMP (~~LOC~~) NOT AT ARMC
CHLAMYDIA, DNA PROBE: NEGATIVE
NEISSERIA GONORRHEA: NEGATIVE

## 2015-03-01 LAB — HIV ANTIBODY (ROUTINE TESTING W REFLEX): HIV Screen 4th Generation wRfx: NONREACTIVE

## 2015-03-07 ENCOUNTER — Telehealth: Payer: Self-pay | Admitting: Obstetrics

## 2015-03-07 ENCOUNTER — Ambulatory Visit (INDEPENDENT_AMBULATORY_CARE_PROVIDER_SITE_OTHER): Payer: 59 | Admitting: Certified Nurse Midwife

## 2015-03-07 VITALS — BP 126/86 | HR 93 | Temp 98.9°F | Wt 256.0 lb

## 2015-03-07 DIAGNOSIS — Z3482 Encounter for supervision of other normal pregnancy, second trimester: Secondary | ICD-10-CM

## 2015-03-07 LAB — POCT URINALYSIS DIPSTICK
Bilirubin, UA: NEGATIVE
Blood, UA: NEGATIVE
Glucose, UA: NEGATIVE
Ketones, UA: NEGATIVE
Leukocytes, UA: NEGATIVE
Nitrite, UA: NEGATIVE
PH UA: 5
Protein, UA: NEGATIVE
Spec Grav, UA: 1.02
Urobilinogen, UA: NEGATIVE

## 2015-03-07 NOTE — Progress Notes (Signed)
  Subjective:    Marisa Yu is a 29 y.o. female being seen today for her obstetrical visit. She is at [redacted]w[redacted]d gestation. Patient reports: backache, no bleeding, no contractions and no leaking.  Problem List Items Addressed This Visit    None     Patient Active Problem List   Diagnosis Date Noted  . Depression affecting pregnancy in first trimester, antepartum 02/08/2015  . Nausea and vomiting during pregnancy prior to [redacted] weeks gestation 02/08/2015  . Anemia affecting pregnancy 02/08/2015  . Supervision of normal pregnancy in first trimester 01/29/2015  . Carbuncle and furuncle of buttock 01/30/2013  . BV (bacterial vaginosis) 01/30/2013  . Excessive or frequent menstruation 01/30/2013    Objective:     LMP 11/21/2014 Uterine Size: Below umbilicus   FHR: 150's  Assessment:    Pregnancy @ [redacted]w[redacted]d  weeks Doing well    Plan:    Problem list reviewed and updated. Labs reviewed. AFP drawn.  Follow up in 4 weeks. FIRST/CF mutation testing/NIPT/QUAD SCREEN/fragile X/[name] population testing/Spinal muscular atrophy discussed: ordered. Role of ultrasound in pregnancy discussed; fetal survey: ordered. Amniocentesis discussed: not indicated. 50% of 15 minute visit spent on counseling and coordination of care.

## 2015-03-12 LAB — AFP, QUAD SCREEN
AFP: 18.5 ng/mL
Curr Gest Age: 15.1 wks.days
Down Syndrome Scr Risk Est: 1:1330 {titer}
HCG, Total: 48.97 IU/mL
INH: 109.3 pg/mL
INTERPRETATION-AFP: NEGATIVE
MoM for AFP: 0.81
MoM for INH: 0.79
MoM for hCG: 1.33
Open Spina bifida: NEGATIVE
Tri 18 Scr Risk Est: NEGATIVE
UE3 MOM: 0.59
uE3 Value: 0.34 ng/mL

## 2015-03-12 NOTE — Telephone Encounter (Signed)
[id number] - close encounter °

## 2015-03-22 ENCOUNTER — Inpatient Hospital Stay (HOSPITAL_COMMUNITY)
Admission: AD | Admit: 2015-03-22 | Discharge: 2015-03-22 | Disposition: A | Discharge status: Home / Self Care | Payer: 59 | Source: Ambulatory Visit | Attending: Obstetrics | Admitting: Obstetrics

## 2015-03-22 ENCOUNTER — Encounter (HOSPITAL_COMMUNITY): Payer: Self-pay | Admitting: *Deleted

## 2015-03-22 DIAGNOSIS — R51 Headache: Secondary | ICD-10-CM

## 2015-03-22 DIAGNOSIS — Z3A17 17 weeks gestation of pregnancy: Secondary | ICD-10-CM | POA: Diagnosis not present

## 2015-03-22 DIAGNOSIS — Z87891 Personal history of nicotine dependence: Secondary | ICD-10-CM | POA: Insufficient documentation

## 2015-03-22 DIAGNOSIS — R109 Unspecified abdominal pain: Secondary | ICD-10-CM

## 2015-03-22 DIAGNOSIS — O26892 Other specified pregnancy related conditions, second trimester: Secondary | ICD-10-CM | POA: Diagnosis not present

## 2015-03-22 DIAGNOSIS — O9989 Other specified diseases and conditions complicating pregnancy, childbirth and the puerperium: Secondary | ICD-10-CM

## 2015-03-22 DIAGNOSIS — R197 Diarrhea, unspecified: Secondary | ICD-10-CM

## 2015-03-22 DIAGNOSIS — Z3A19 19 weeks gestation of pregnancy: Secondary | ICD-10-CM

## 2015-03-22 DIAGNOSIS — O26899 Other specified pregnancy related conditions, unspecified trimester: Secondary | ICD-10-CM

## 2015-03-22 HISTORY — DX: Hypothyroidism, unspecified: E03.9

## 2015-03-22 HISTORY — DX: Anemia, unspecified: D64.9

## 2015-03-22 LAB — CBC
HCT: 31.6 % — ABNORMAL LOW (ref 36.0–46.0)
Hemoglobin: 10.1 g/dL — ABNORMAL LOW (ref 12.0–15.0)
MCH: 24.6 pg — ABNORMAL LOW (ref 26.0–34.0)
MCHC: 32 g/dL (ref 30.0–36.0)
MCV: 77.1 fL — AB (ref 78.0–100.0)
Platelets: 330 10*3/uL (ref 150–400)
RBC: 4.1 MIL/uL (ref 3.87–5.11)
RDW: 15.6 % — ABNORMAL HIGH (ref 11.5–15.5)
WBC: 7.1 10*3/uL (ref 4.0–10.5)

## 2015-03-22 LAB — URINALYSIS, ROUTINE W REFLEX MICROSCOPIC
BILIRUBIN URINE: NEGATIVE
Glucose, UA: NEGATIVE mg/dL
HGB URINE DIPSTICK: NEGATIVE
KETONES UR: NEGATIVE mg/dL
Leukocytes, UA: NEGATIVE
NITRITE: NEGATIVE
Protein, ur: NEGATIVE mg/dL
SPECIFIC GRAVITY, URINE: 1.025 (ref 1.005–1.030)
Urobilinogen, UA: 0.2 mg/dL (ref 0.0–1.0)
pH: 5.5 (ref 5.0–8.0)

## 2015-03-22 MED ORDER — BUTALBITAL-APAP-CAFFEINE 50-325-40 MG PO TABS
1.0000 | ORAL_TABLET | Freq: Once | ORAL | Status: AC
  Administered 2015-03-22: 1 via ORAL
  Filled 2015-03-22: qty 1

## 2015-03-22 NOTE — MAU Note (Signed)
HA x 3 days, also diarrhea x 3 days.  No vomiting.  Pain in RLQ & L side around ribs.  Denies bleeding.

## 2015-03-22 NOTE — Discharge Instructions (Signed)
Abdominal Pain During Pregnancy °Abdominal pain is common in pregnancy. Most of the time, it does not cause harm. There are many causes of abdominal pain. Some causes are more serious than others. Some of the causes of abdominal pain in pregnancy are easily diagnosed. Occasionally, the diagnosis takes time to understand. Other times, the cause is not determined. Abdominal pain can be a sign that something is very wrong with the pregnancy, or the pain may have nothing to do with the pregnancy at all. For this reason, always tell your health care provider if you have any abdominal discomfort. °HOME CARE INSTRUCTIONS  °Monitor your abdominal pain for any changes. The following actions may help to alleviate any discomfort you are experiencing: °· Do not have sexual intercourse or put anything in your vagina until your symptoms go away completely. °· Get plenty of rest until your pain improves. °· Drink clear fluids if you feel nauseous. Avoid solid food as long as you are uncomfortable or nauseous. °· Only take over-the-counter or prescription medicine as directed by your health care provider. °· Keep all follow-up appointments with your health care provider. °SEEK IMMEDIATE MEDICAL CARE IF: °· You are bleeding, leaking fluid, or passing tissue from the vagina. °· You have increasing pain or cramping. °· You have persistent vomiting. °· You have painful or bloody urination. °· You have a fever. °· You notice a decrease in your baby's movements. °· You have extreme weakness or feel faint. °· You have shortness of breath, with or without abdominal pain. °· You develop a severe headache with abdominal pain. °· You have abnormal vaginal discharge with abdominal pain. °· You have persistent diarrhea. °· You have abdominal pain that continues even after rest, or gets worse. °MAKE SURE YOU:  °· Understand these instructions. °· Will watch your condition. °· Will get help right away if you are not doing well or get  worse. °Document Released: 09/07/2005 Document Revised: 06/28/2013 Document Reviewed: 04/06/2013 °ExitCare® Patient Information ©2015 [employer]. This information is not intended to replace advice given to you by your health care provider. Make sure you discuss any questions you have with your health care provider. ° °Abdominal Pain During Pregnancy °Abdominal pain is common in pregnancy. Most of the time, it does not cause harm. There are many causes of abdominal pain. Some causes are more serious than others. Some of the causes of abdominal pain in pregnancy are easily diagnosed. Occasionally, the diagnosis takes time to understand. Other times, the cause is not determined. Abdominal pain can be a sign that something is very wrong with the pregnancy, or the pain may have nothing to do with the pregnancy at all. For this reason, always tell your health care provider if you have any abdominal discomfort. °HOME CARE INSTRUCTIONS  °Monitor your abdominal pain for any changes. The following actions may help to alleviate any discomfort you are experiencing: °· Do not have sexual intercourse or put anything in your vagina until your symptoms go away completely. °· Get plenty of rest until your pain improves. °· Drink clear fluids if you feel nauseous. Avoid solid food as long as you are uncomfortable or nauseous. °· Only take over-the-counter or prescription medicine as directed by your health care provider. °· Keep all follow-up appointments with your health care provider. °SEEK IMMEDIATE MEDICAL CARE IF: °· You are bleeding, leaking fluid, or passing tissue from the vagina. °· You have increasing pain or cramping. °· You have persistent vomiting. °· You have painful   or bloody urination. °· You have a fever. °· You notice a decrease in your baby's movements. °· You have extreme weakness or feel faint. °· You have shortness of breath, with or without abdominal pain. °· You develop a severe headache with abdominal  pain. °· You have abnormal vaginal discharge with abdominal pain. °· You have persistent diarrhea. °· You have abdominal pain that continues even after rest, or gets worse. °MAKE SURE YOU:  °· Understand these instructions. °· Will watch your condition. °· Will get help right away if you are not doing well or get worse. °Document Released: 09/07/2005 Document Revised: 06/28/2013 Document Reviewed: 04/06/2013 °ExitCare® Patient Information ©2015 [employer]. This information is not intended to replace advice given to you by your health care provider. Make sure you discuss any questions you have with your health care provider. ° °

## 2015-03-22 NOTE — MAU Provider Note (Signed)
History     CSN: [id number]  Arrival date and time: 03/22/15 4098   First Provider Initiated Contact with Patient 03/22/15 1000      Chief Complaint  Patient presents with  . Headache  . Diarrhea  . Abdominal Pain   HPI Marisa Yu 29 y.o. [id number]  presents to MAU complaining of headache, side pain and diarrhea x 3 days, up to 5 times each day.  She notes the diarrhea is green and soft but not watery.  She sometimes has belly pain with this.  Now she has abdominal pain whenever she sneezes or coughs.  She feels weakness but notes no dizziness or passing out.  She denies vaginal bleeding, leaking of fluid or discharge, dysuria, fever, nausea, vomiting.  She has not used any medications or tried anything to make her symptoms.  She notes eating more fruits and vegetables over the last week or so.   She has had same feelings before and IV fluids improved all of her symptoms.   OB History    Gravida Para Term Preterm AB TAB SAB Ectopic Multiple Living   0 0 0 0 0 0 2      Past Medical History  Diagnosis Date  . Hypertension   . Headache   . Vaginal Pap smear, abnormal   . Boil, arm   . Tooth ache   . Hypothyroidism   . Anemia     Past Surgical History  Procedure Laterality Date  . Cesarean section  2004  . Cesarean section  2009  . Cyst excision    . Wisdom tooth extraction      Family History  Problem Relation Age of Onset  . Cancer Maternal Aunt     Breast  . Cancer Maternal Grandmother     Lung  . Cancer Maternal Aunt     Cervical  . Heart disease Maternal Grandfather     History  Substance Use Topics  . Smoking status: Former Smoker    Types: Cigarettes    Quit date: 09/22/2011  . Smokeless tobacco: Never Used  . Alcohol Use: No    Allergies:  Allergies  Allergen Reactions  . Codeine Itching    Prescriptions prior to admission  Medication Sig Dispense Refill Last Dose  . fluconazole (DIFLUCAN) 150 MG tablet Take 1 tablet (150 mg  total) by mouth daily. (Patient not taking: Reported on 03/07/2015) 1 tablet 0 Not Taking  . IRON PO Take 1 tablet by mouth daily.   Not Taking  . Prenat w/o A-FeCbn-Meth-FA-DHA (OB COMPLETE GOLD) 27.5-1-200 MG CAPS Take 1 tablet by mouth daily. 30 capsule 12 Taking  . Prenatal Vit-Fe Fumarate-FA (PRENATAL PO) Take by mouth.   Not Taking    ROS Pertinent ROS in HPI.  All other systems are negative.    Physical Exam   Blood pressure 131/83, pulse 95, temperature 98.2 F (36.8 C), temperature source Oral, resp. rate 16, last menstrual period 11/21/2014.  Physical Exam  Constitutional: She is oriented to person, place, and time. She appears well-developed and well-nourished. No distress.  HENT:  Head: Normocephalic and atraumatic.  Eyes: EOM are normal.  Neck: Normal range of motion.  Cardiovascular: Normal rate.   Respiratory: Effort normal and breath sounds normal. No respiratory distress.  GI: Soft. Bowel sounds are normal. There is tenderness.  Tenderness in left upper quadrant only  Musculoskeletal: Normal range of motion.  Neurological: She is alert and oriented to person, place, and time.  Skin: Skin is warm and dry.  Psychiatric: She has a normal mood and affect.   Results for orders placed or performed during the hospital encounter of 03/22/15 (from the past 24 hour(s))  Urinalysis, Routine w reflex microscopic (not [facility])     Status: None   Collection Time: 03/22/15 10:00 AM  Result Value Ref Range   Color, Urine YELLOW YELLOW   APPearance CLEAR CLEAR   Specific Gravity, Urine 1.025 1.005 - 1.030   pH 5.5 5.0 - 8.0   Glucose, UA NEGATIVE NEGATIVE mg/dL   Hgb urine dipstick NEGATIVE NEGATIVE   Bilirubin Urine NEGATIVE NEGATIVE   Ketones, ur NEGATIVE NEGATIVE mg/dL   Protein, ur NEGATIVE NEGATIVE mg/dL   Urobilinogen, UA 0.2 0.0 - 1.0 mg/dL   Nitrite NEGATIVE NEGATIVE   Leukocytes, UA NEGATIVE NEGATIVE  CBC     Status: Abnormal   Collection Time: 03/22/15 10:20 AM   Result Value Ref Range   WBC 7.1 4.0 - 10.5 K/uL   RBC 4.10 3.87 - 5.11 MIL/uL   Hemoglobin 10.1 (L) 12.0 - 15.0 g/dL   HCT 40.931.6 (L) 81.136.0 - 91.446.0 %   MCV 77.1 (L) 78.0 - 100.0 fL   MCH 24.6 (L) 26.0 - 34.0 pg   MCHC 32.0 30.0 - 36.0 g/dL   RDW 78.215.6 (H) 95.611.5 - 21.315.5 %   Platelets 330 150 - 400 K/uL     MAU Course  Procedures  MDM Fioricet given for HA.  Pt indicates 100% resolution.  CBC, U/A without infection, anemia or dehydration. Vitals are stable.   Therefore appendicitis unlikely Pt agreeable to discharge and states she does not need any prescriptions.    Assessment and Plan  A:  1. Headache in pregnancy, antepartum, second trimester   2. Diarrhea   3. Abdominal pain in pregnancy, antepartum      P: Discharge to home OTC Tylenol for HA Push po fluids F/u with Dr. Clearance CootsHarper as scheduled/as needed Patient may return to MAU as needed or if her condition were to change or worsen    Bertram Denvereague Clark, Karen E 03/22/2015, 10:01 AM

## 2015-03-25 ENCOUNTER — Encounter (HOSPITAL_COMMUNITY): Payer: Self-pay | Admitting: *Deleted

## 2015-03-25 ENCOUNTER — Inpatient Hospital Stay (HOSPITAL_COMMUNITY)
Admission: AD | Admit: 2015-03-25 | Discharge: 2015-03-25 | Disposition: A | Discharge status: Home / Self Care | Payer: 59 | Source: Ambulatory Visit | Attending: Obstetrics | Admitting: Obstetrics

## 2015-03-25 DIAGNOSIS — O9989 Other specified diseases and conditions complicating pregnancy, childbirth and the puerperium: Secondary | ICD-10-CM | POA: Insufficient documentation

## 2015-03-25 DIAGNOSIS — Z87891 Personal history of nicotine dependence: Secondary | ICD-10-CM | POA: Insufficient documentation

## 2015-03-25 DIAGNOSIS — Z3A17 17 weeks gestation of pregnancy: Secondary | ICD-10-CM | POA: Diagnosis not present

## 2015-03-25 DIAGNOSIS — K529 Noninfective gastroenteritis and colitis, unspecified: Secondary | ICD-10-CM | POA: Diagnosis not present

## 2015-03-25 DIAGNOSIS — R197 Diarrhea, unspecified: Secondary | ICD-10-CM | POA: Diagnosis present

## 2015-03-25 LAB — URINALYSIS, ROUTINE W REFLEX MICROSCOPIC
Bilirubin Urine: NEGATIVE
Glucose, UA: NEGATIVE mg/dL
HGB URINE DIPSTICK: NEGATIVE
KETONES UR: NEGATIVE mg/dL
Leukocytes, UA: NEGATIVE
Nitrite: NEGATIVE
Protein, ur: NEGATIVE mg/dL
Specific Gravity, Urine: >1.03 — ABNORMAL HIGH (ref 1.005–1.030)
Urobilinogen, UA: 0.2 mg/dL (ref 0.0–1.0)
pH: 5 (ref 5.0–8.0)

## 2015-03-25 LAB — COMPREHENSIVE METABOLIC PANEL
ALT: 21 U/L (ref 14–54)
AST: 22 U/L (ref 15–41)
Albumin: 3 g/dL — ABNORMAL LOW (ref 3.5–5.0)
Alkaline Phosphatase: 62 U/L (ref 38–126)
Anion gap: 3 — ABNORMAL LOW (ref 5–15)
BUN: 8 mg/dL (ref 6–20)
CHLORIDE: 106 mmol/L (ref 101–111)
CO2: 23 mmol/L (ref 22–32)
Calcium: 8.6 mg/dL — ABNORMAL LOW (ref 8.9–10.3)
Creatinine, Ser: 0.64 mg/dL (ref 0.44–1.00)
GFR calc Af Amer: >60 mL/min (ref >60)
Glucose, Bld: 111 mg/dL — ABNORMAL HIGH (ref 65–99)
Potassium: 3.5 mmol/L (ref 3.5–5.1)
Sodium: 132 mmol/L — ABNORMAL LOW (ref 135–145)
Total Bilirubin: 0.3 mg/dL (ref 0.3–1.2)
Total Protein: 7.1 g/dL (ref 6.5–8.1)

## 2015-03-25 LAB — CBC
HEMATOCRIT: 32.5 % — AB (ref 36.0–46.0)
HEMOGLOBIN: 10.3 g/dL — AB (ref 12.0–15.0)
MCH: 24.5 pg — AB (ref 26.0–34.0)
MCHC: 31.7 g/dL (ref 30.0–36.0)
MCV: 77.4 fL — ABNORMAL LOW (ref 78.0–100.0)
Platelets: 329 10*3/uL (ref 150–400)
RBC: 4.2 MIL/uL (ref 3.87–5.11)
RDW: 15.6 % — ABNORMAL HIGH (ref 11.5–15.5)
WBC: 8 10*3/uL (ref 4.0–10.5)

## 2015-03-25 NOTE — Discharge Instructions (Signed)
Drink at least 8 8-oz glasses of water every day. Continue Immodium Wash your hands with soap and water after every stool. Do not eat fats or oils. Follow up in the office if you continue have problems especially if you have fever or bloody stools. Use Desitin to control irritation on the anus from stools.

## 2015-03-25 NOTE — MAU Provider Note (Signed)
History     CSN: [id number]  Arrival date and time: 03/25/15 1810  Seen by provider at 1845    Chief Complaint  Patient presents with  . Diarrhea   HPI Marisa Yu 29 y.o. 4942w5d  Client comes with diarrhea x 6 days.  Is having multiple stools per day (as many as 6 per day).  Has some abdominal cramping with the stools.  Has tried Immodium.  Takes 2 pills every 6 hours.  Has had some improvement but then the loose stools start again.  Gave a stool sample in MAU.  Her anus is irritated and she sees blood when she wipes.  Has not noted the character of the stool in the toilet.  Flushes when she has the stool.  Works in a day care center.  No one else at home has been sick.  No fever, no nausea, no vomiting.  Diet recall today includes cheesecake pancakes with bacon and hashbrowns for breakfast and philly steak and cheese sub with french fries.  Denies any travel.  Denies taking antibiotics in the past few weeks.  OB History    Gravida Para Term Preterm AB TAB SAB Ectopic Multiple Living   3 2 2  0 0 0 0 0 0 2      Past Medical History  Diagnosis Date  . Hypertension   . Headache   . Vaginal Pap smear, abnormal   . Boil, arm   . Tooth ache   . Hypothyroidism   . Anemia     Past Surgical History  Procedure Laterality Date  . Cesarean section  2004  . Cesarean section  2009  . Cyst excision    . Wisdom tooth extraction      Family History  Problem Relation Age of Onset  . Cancer Maternal Aunt     Breast  . Cancer Maternal Grandmother     Lung  . Cancer Maternal Aunt     Cervical  . Heart disease Maternal Grandfather     History  Substance Use Topics  . Smoking status: Former Smoker    Types: Cigarettes    Quit date: 09/22/2011  . Smokeless tobacco: Never Used  . Alcohol Use: No    Allergies:  Allergies  Allergen Reactions  . Codeine Itching    Prescriptions prior to admission  Medication Sig Dispense Refill Last Dose  . acetaminophen (TYLENOL) 500 MG  tablet Take 1,000 mg by mouth every 6 (six) hours as needed for mild pain, moderate pain or headache.    Past Week at Unknown time  . loperamide (IMODIUM A-D) 2 MG tablet Take 4 mg by mouth 3 (three) times daily as needed for diarrhea or loose stools.    03/25/2015 at 1000    ROS Physical Exam   Blood pressure 117/79, pulse 92, temperature 98.3 F (36.8 C), temperature source Oral, resp. rate 18, height 5\' 6"  (1.676 m), weight 256 lb 2 oz (116.178 kg), last menstrual period 11/21/2014.  Physical Exam  Nursing note and vitals reviewed. Constitutional: She is oriented to person, place, and time. She appears well-developed and well-nourished.  obese  HENT:  Head: Normocephalic.  Eyes: EOM are normal.  Neck: Neck supple.  GI: Soft. There is tenderness. There is no rebound and no guarding.  Mild diffuse tenderness. FHT heard by RN with doppler.  Musculoskeletal: Normal range of motion.  Neurological: She is alert and oriented to person, place, and time.  Skin: Skin is warm and dry.  Psychiatric:  She has a normal mood and affect.    MAU Course  Procedures Results for orders placed or performed during the hospital encounter of 03/25/15 (from the past 24 hour(s))  CBC     Status: Abnormal   Collection Time: 03/25/15  7:21 PM  Result Value Ref Range   WBC 8.0 4.0 - 10.5 K/uL   RBC 4.20 3.87 - 5.11 MIL/uL   Hemoglobin 10.3 (L) 12.0 - 15.0 g/dL   HCT 16.1 (L) 09.6 - 04.5 %   MCV 77.4 (L) 78.0 - 100.0 fL   MCH 24.5 (L) 26.0 - 34.0 pg   MCHC 31.7 30.0 - 36.0 g/dL   RDW 40.9 (H) 81.1 - 91.4 %   Platelets 329 150 - 400 K/uL  Comprehensive metabolic panel     Status: Abnormal   Collection Time: 03/25/15  7:21 PM  Result Value Ref Range   Sodium 132 (L) 135 - 145 mmol/L   Potassium 3.5 3.5 - 5.1 mmol/L   Chloride 106 101 - 111 mmol/L   CO2 23 22 - 32 mmol/L   Glucose, Bld 111 (H) 65 - 99 mg/dL   BUN 8 6 - 20 mg/dL   Creatinine, Ser 7.82 0.44 - 1.00 mg/dL   Calcium 8.6 (L) 8.9 - 10.3  mg/dL   Total Protein 7.1 6.5 - 8.1 g/dL   Albumin 3.0 (L) 3.5 - 5.0 g/dL   AST 22 15 - 41 U/L   ALT 21 14 - 54 U/L   Alkaline Phosphatase 62 38 - 126 U/L   Total Bilirubin 0.3 0.3 - 1.2 mg/dL   GFR calc non Af Amer >60 >60 mL/min   GFR calc Af Amer >60 >60 mL/min   Anion gap 3 (L) 5 - 15    MDM Since she has had no fever, will do stool culture which will investigate shigella, campylobacter, salmonella and E. Coli since she works in a day care setting.  Discussed diet with diarrhea and advised no fried foods, no high fat foods.  Encouraged to continue immodium as needed.  Advised to use desitin on anus for comfort.  Will check urine and blood work to evaluate for dehydration.  Stool submitted to the lab is formed but soft - not liquid.   Assessment and Plan  Diarrhea Pregnant [redacted] weeks  Plan Stool culture Alter diet to remove fats. Keep your appointments in the office. Call the office if you continue to have problems with diarrhea.  BURLESON,TERRI 03/25/2015, 7:06 PM

## 2015-03-25 NOTE — MAU Note (Signed)
Patient presents at [redacted] weeks gestation with c/o diarrhea X 7 days. Fetus active. Denies bleeding or discharge.

## 2015-03-25 NOTE — MAU Note (Signed)
Urine in lab 

## 2015-03-25 NOTE — MAU Note (Signed)
Pt. States she has been having diarrhea for a week. Unsure if dehydrated. Not on antibiotics currently. Pt. States she has been drinking a lot of water and taking imodium with her symptoms. Here for evaluation.

## 2015-03-26 ENCOUNTER — Other Ambulatory Visit: Payer: Self-pay | Admitting: Certified Nurse Midwife

## 2015-03-26 ENCOUNTER — Telehealth: Payer: Self-pay | Admitting: *Deleted

## 2015-03-26 NOTE — Telephone Encounter (Signed)
Patient reports diarrhea since Tuesday- was seen at hospital twice- once for headache and then for persistent diarrhea. Bottom and stomach are hurting. 11:15 Stool Cultures not resulted- reviewed electrolyte intake, skin care, fiber and diet, imodium use. Patient is aware of and awaits results.

## 2015-03-28 ENCOUNTER — Telehealth: Payer: Self-pay

## 2015-03-28 NOTE — Telephone Encounter (Signed)
[facility] NOT GOING TO BE HERE ON 7/14 AND PATIENT DID NOT WANT TO WAIT UNTIL 7/21 - [facility] - PATIENT WAS ALSO TOLD SHE MAY GET A BILL FOR [employer]

## 2015-03-30 LAB — STOOL CULTURE

## 2015-04-02 ENCOUNTER — Ambulatory Visit (HOSPITAL_COMMUNITY)
Admission: RE | Admit: 2015-04-02 | Discharge: 2015-04-02 | Disposition: A | Discharge status: Home / Self Care | Payer: 59 | Source: Ambulatory Visit | Attending: Certified Nurse Midwife | Admitting: Certified Nurse Midwife

## 2015-04-02 ENCOUNTER — Ambulatory Visit (INDEPENDENT_AMBULATORY_CARE_PROVIDER_SITE_OTHER): Payer: No Typology Code available for payment source | Admitting: Certified Nurse Midwife

## 2015-04-02 ENCOUNTER — Other Ambulatory Visit: Payer: Self-pay | Admitting: Certified Nurse Midwife

## 2015-04-02 VITALS — BP 116/87 | HR 81 | Temp 99.3°F | Wt 259.0 lb

## 2015-04-02 DIAGNOSIS — Z3492 Encounter for supervision of normal pregnancy, unspecified, second trimester: Secondary | ICD-10-CM

## 2015-04-02 DIAGNOSIS — Z3482 Encounter for supervision of other normal pregnancy, second trimester: Secondary | ICD-10-CM

## 2015-04-02 DIAGNOSIS — Z3A18 18 weeks gestation of pregnancy: Secondary | ICD-10-CM | POA: Insufficient documentation

## 2015-04-02 DIAGNOSIS — O99212 Obesity complicating pregnancy, second trimester: Secondary | ICD-10-CM | POA: Insufficient documentation

## 2015-04-02 DIAGNOSIS — Z3689 Encounter for other specified antenatal screening: Secondary | ICD-10-CM | POA: Insufficient documentation

## 2015-04-03 ENCOUNTER — Other Ambulatory Visit: Payer: Self-pay | Admitting: Certified Nurse Midwife

## 2015-04-03 NOTE — Progress Notes (Signed)
Subjective:    Marisa Yu is a 29 y.o. female being seen today for her obstetrical visit. She is at 4683w0d gestation. Patient reports: backache, no bleeding, no contractions, no cramping and no leaking . Fetal movement: normal.  Patient desires BTL at time of C-section.  She also desires to schedule her C-section for the second week of December.    Problem List Items Addressed This Visit    None    Visit Diagnoses    Prenatal care, second trimester    -  Primary    Relevant Orders    POCT urinalysis dipstick      Patient Active Problem List   Diagnosis Date Noted  . Obesity complicating pregnancy in second trimester   . Encounter for fetal anatomic survey   . [redacted] weeks gestation of pregnancy   . Depression affecting pregnancy in first trimester, antepartum 02/08/2015  . Nausea and vomiting during pregnancy prior to [redacted] weeks gestation 02/08/2015  . Anemia affecting pregnancy 02/08/2015  . Supervision of normal pregnancy in first trimester 01/29/2015  . Carbuncle and furuncle of buttock 01/30/2013  . BV (bacterial vaginosis) 01/30/2013  . Excessive or frequent menstruation 01/30/2013   Objective:    BP 116/87 mmHg  Pulse 81  Temp(Src) 99.3 F (37.4 C)  Wt 259 lb (117.482 kg)  LMP 11/21/2014 FHT: 150's BPM  Uterine Size: size equals dates     Assessment:    Pregnancy @ 383w0d    Doing well  Plan:    OBGCT: discussed. Signs and symptoms of preterm labor: discussed. Discussed her concerns with delivery:  Patient desires a clear drape for C-section, infant on maternal chest with delayed cord clamping until cord turns white and to keep the placenta.  Encouraged patient to discuss these requests with Dr. Clearance CootsHarper at next visit.  Relayed requests to Dr. Clearance CootsHarper.   Labs, problem list reviewed and updated 2 hr GTT planned Follow up in 4 weeks.

## 2015-04-03 NOTE — Patient Instructions (Signed)
Second Trimester of Pregnancy The second trimester is from week 13 through week 28, months 4 through 6. The second trimester is often a time when you feel your best. Your body has also adjusted to being pregnant, and you begin to feel better physically. Usually, morning sickness has lessened or quit completely, you may have more energy, and you may have an increase in appetite. The second trimester is also a time when the fetus is growing rapidly. At the end of the sixth month, the fetus is about 9 inches long and weighs about 1 pounds. You will likely begin to feel the baby move (quickening) between 18 and 20 weeks of the pregnancy. BODY CHANGES Your body goes through many changes during pregnancy. The changes vary from woman to woman.   Your weight will continue to increase. You will notice your lower abdomen bulging out.  You may begin to get stretch marks on your hips, abdomen, and breasts.  You may develop headaches that can be relieved by medicines approved by your health care provider.  You may urinate more often because the fetus is pressing on your bladder.  You may develop or continue to have heartburn as a result of your pregnancy.  You may develop constipation because certain hormones are causing the muscles that push waste through your intestines to slow down.  You may develop hemorrhoids or swollen, bulging veins (varicose veins).  You may have back pain because of the weight gain and pregnancy hormones relaxing your joints between the bones in your pelvis and as a result of a shift in weight and the muscles that support your balance.  Your breasts will continue to grow and be tender.  Your gums may bleed and may be sensitive to brushing and flossing.  Dark spots or blotches (chloasma, mask of pregnancy) may develop on your face. This will likely fade after the baby is born.  A dark line from your belly button to the pubic area (linea nigra) may appear. This will likely fade  after the baby is born.  You may have changes in your hair. These can include thickening of your hair, rapid growth, and changes in texture. Some women also have hair loss during or after pregnancy, or hair that feels dry or thin. Your hair will most likely return to normal after your baby is born. WHAT TO EXPECT AT YOUR PRENATAL VISITS During a routine prenatal visit:  You will be weighed to make sure you and the fetus are growing normally.  Your blood pressure will be taken.  Your abdomen will be measured to track your baby's growth.  The fetal heartbeat will be listened to.  Any test results from the previous visit will be discussed. Your health care provider may ask you:  How you are feeling.  If you are feeling the baby move.  If you have had any abnormal symptoms, such as leaking fluid, bleeding, severe headaches, or abdominal cramping.  If you have any questions. Other tests that may be performed during your second trimester include:  Blood tests that check for:  Low iron levels (anemia).  Gestational diabetes (between 24 and 28 weeks).  Rh antibodies.  Urine tests to check for infections, diabetes, or protein in the urine.  An ultrasound to confirm the proper growth and development of the baby.  An amniocentesis to check for possible genetic problems.  Fetal screens for spina bifida and Down syndrome. HOME CARE INSTRUCTIONS   Avoid all smoking, herbs, alcohol, and unprescribed   drugs. These chemicals affect the formation and growth of the baby.  Follow your health care provider's instructions regarding medicine use. There are medicines that are either safe or unsafe to take during pregnancy.  Exercise only as directed by your health care provider. Experiencing uterine cramps is a good sign to stop exercising.  Continue to eat regular, healthy meals.  Wear a good support bra for breast tenderness.  Do not use hot tubs, steam rooms, or saunas.  Wear your  seat belt at all times when driving.  Avoid raw meat, uncooked cheese, cat litter boxes, and soil used by cats. These carry germs that can cause birth defects in the baby.  Take your prenatal vitamins.  Try taking a stool softener (if your health care provider approves) if you develop constipation. Eat more high-fiber foods, such as fresh vegetables or fruit and whole grains. Drink plenty of fluids to keep your urine clear or pale yellow.  Take warm sitz baths to soothe any pain or discomfort caused by hemorrhoids. Use hemorrhoid cream if your health care provider approves.  If you develop varicose veins, wear support hose. Elevate your feet for 15 minutes, 3-4 times a day. Limit salt in your diet.  Avoid heavy lifting, wear low heel shoes, and practice good posture.  Rest with your legs elevated if you have leg cramps or low back pain.  Visit your dentist if you have not gone yet during your pregnancy. Use a soft toothbrush to brush your teeth and be gentle when you floss.  A sexual relationship may be continued unless your health care provider directs you otherwise.  Continue to go to all your prenatal visits as directed by your health care provider. SEEK MEDICAL CARE IF:   You have dizziness.  You have mild pelvic cramps, pelvic pressure, or nagging pain in the abdominal area.  You have persistent nausea, vomiting, or diarrhea.  You have a bad smelling vaginal discharge.  You have pain with urination. SEEK IMMEDIATE MEDICAL CARE IF:   You have a fever.  You are leaking fluid from your vagina.  You have spotting or bleeding from your vagina.  You have severe abdominal cramping or pain.  You have rapid weight gain or loss.  You have shortness of breath with chest pain.  You notice sudden or extreme swelling of your face, hands, ankles, feet, or legs.  You have not felt your baby move in over an hour.  You have severe headaches that do not go away with  medicine.  You have vision changes. Document Released: 09/01/2001 Document Revised: 09/12/2013 Document Reviewed: 11/08/2012 [facility] Patient Information 2015 [place], [place]. This information is not intended to replace advice given to you by your health care provider. Make sure you discuss any questions you have with your health care provider. Preterm Labor Information Preterm labor is when labor starts at less than 37 weeks of pregnancy. The normal length of a pregnancy is 39 to 41 weeks. CAUSES Often, there is no identifiable underlying cause as to why a woman goes into preterm labor. One of the most common known causes of preterm labor is infection. Infections of the uterus, cervix, vagina, amniotic sac, bladder, kidney, or even the lungs (pneumonia) can cause labor to start. Other suspected causes of preterm labor include:   Urogenital infections, such as yeast infections and bacterial vaginosis.   Uterine abnormalities (uterine shape, uterine septum, fibroids, or bleeding from the placenta).   A cervix that has been operated on (it  may fail to stay closed).   Malformations in the fetus.   Multiple gestations (twins, triplets, and so on).   Breakage of the amniotic sac.  RISK FACTORS  Having a previous history of preterm labor.   Having premature rupture of membranes (PROM).   Having a placenta that covers the opening of the cervix (placenta previa).   Having a placenta that separates from the uterus (placental abruption).   Having a cervix that is too weak to hold the fetus in the uterus (incompetent cervix).   Having too much fluid in the amniotic sac (polyhydramnios).   Taking illegal drugs or smoking while pregnant.   Not gaining enough weight while pregnant.   Being younger than 6318 and older than 29 years old.   Having a low socioeconomic status.   Being African American. SYMPTOMS Signs and symptoms of preterm labor include:   Menstrual-like  cramps, abdominal pain, or back pain.  Uterine contractions that are regular, as frequent as six in an hour, regardless of their intensity (may be mild or painful).  Contractions that start on the top of the uterus and spread down to the lower abdomen and back.   A sense of increased pelvic pressure.   A watery or bloody mucus discharge that comes from the vagina.  TREATMENT Depending on the length of the pregnancy and other circumstances, your health care provider may suggest bed rest. If necessary, there are medicines that can be given to stop contractions and to mature the fetal lungs. If labor happens before 34 weeks of pregnancy, a prolonged hospital stay may be recommended. Treatment depends on the condition of both you and the fetus.  WHAT SHOULD YOU DO IF YOU THINK YOU ARE IN PRETERM LABOR? Call your health care provider right away. You will need to go to the hospital to get checked immediately. HOW CAN YOU PREVENT PRETERM LABOR IN FUTURE PREGNANCIES? You should:   Stop smoking if you smoke.  Maintain healthy weight gain and avoid chemicals and drugs that are not necessary.  Be watchful for any type of infection.  Inform your health care provider if you have a known history of preterm labor. Document Released: 11/28/2003 Document Revised: 05/10/2013 Document Reviewed: 10/10/2012 [facility] Patient Information 2015 [place]. This information is not intended to replace advice given to you by your health care provider. Make sure you discuss any questions you have with your health care provider. Cesarean Delivery  Cesarean delivery is the birth of a baby through a cut (incision) in the abdomen and womb (uterus).  LET [facility] PROVIDER KNOW ABOUT:  All medicines you are taking, including vitamins, herbs, eye drops, creams, and over-the-counter medicines.  Previous problems you or members of your family have had with the use of anesthetics.  Any blood disorders you  have.  Previous surgeries you have had.  Medical conditions you have.  Any allergies you have.  Complicationsinvolving the pregnancy. RISKS AND COMPLICATIONS  Generally, this is a safe procedure. However, as with any procedure, complications can occur. Possible complications include:  Bleeding.  Infection.  Blood clots.  Injury to surrounding organs.  Problems with anesthesia.  Injury to the baby. BEFORE THE PROCEDURE   You may be given an antacid medicine to drink. This will prevent acid contents in your stomach from going into your lungs if you vomit during the surgery.  You may be given an antibiotic medicine to prevent infection. PROCEDURE   Hair may be removed from your pubic  area and your lower abdomen. This is to prevent infection in the incision site.  A tube (Foley catheter) will be placed in your bladder to drain your urine from your bladder into a bag. This keeps your bladder empty during surgery.  An IV tube will be placed in your vein.  You may be given medicine to numb the lower half of your body (regional anesthetic). If you were in labor, you may have already had an epidural in place which can be used in both labor and cesarean delivery. You may possibly be given medicine to make you sleep (general anesthetic) though this is not as common.  An incision will be made in your abdomen that extends to your uterus. There are 2 basic kinds of incisions:  The horizontal (transverse) incision. Horizontal incisions are from side to side and are used for most routine cesarean deliveries.  The vertical incision. The vertical incision is from the top of the abdomen to the bottom and is less commonly used. It is often done for women who have a serious complication (extreme prematurity) or under emergency situations.  The horizontal and vertical incisions may both be used at the same time. However, this is very uncommon.  An incision is then made in your uterus to  deliver the baby.  Your baby will then be delivered.  Both incisions are then closed with absorbable stitches. AFTER THE PROCEDURE   If you were awake during the surgery, you will see your baby right away. If you were asleep, you will see your baby as soon as you are awake.  You may breastfeed your baby after surgery.  You may be able to get up and walk the same day as the surgery. If you need to stay in bed for a period of time, you will receive help to turn, cough, and take deep breaths after surgery. This helps prevent lung problems such as pneumonia.  Do not get out of bed alone the first time after surgery. You will need help getting out of bed until you are able to do this by yourself.  You may be able to shower the day after your cesarean delivery. After the bandage (dressing) is taken off the incision site, a nurse will assist you to shower if you would like help.  You will have pneumatic compression hose placed on your lower legs. This is done to prevent blood clots. When you are up and walking regularly, they will no longer be necessary.  Do not cross your legs when you sit.  Save any blood clots that you pass. If you pass a clot while on the toilet, do not flush it. Call for the nurse. Tell the nurse if you think you are bleeding too much or passing too many clots.  You will be given medicine as needed. Let your health care providers know if you are hurting. You may also be given an antibiotic to prevent an infection.  Your IV tube will be taken out when you are drinking a reasonable amount of fluids. The Foley catheter is taken out when you are up and walking.  If your blood type is Rh negative and your baby's blood type is Rh positive, you will be given a shot of anti-D immune globulin. This shot prevents you from having Rh problems with a future pregnancy. You should get the shot even if you had your tubes tied (tubal ligation).  If you are allowed to take the baby for a  walk,  place the baby in the bassinet and push it. Do not carry your baby in your arms. Document Released: 09/07/2005 Document Revised: 06/28/2013 Document Reviewed: 03/29/2013 [facility] Patient Information 2015 [place], [place]. This information is not intended to replace advice given to you by your health care provider. Make sure you discuss any questions you have with your health care provider. Postpartum Tubal Ligation A postpartum tubal ligation (PPTL) is a procedure that blocks the fallopian tubes right after childbirth or 1-2 days after childbirth. PPTL is done before the uterus returns to its normal location. The procedure is also called a minilaparotomy. By blocking the fallopian tubes, the eggs that are released from the ovaries cannot enter the uterus and sperm cannot reach the egg. A PPTL is done so you will not be able to get pregnant or have a baby.  Although this procedure may be reversed, it should be considered permanent and irreversible. If you want to have future pregnancies, you should not have this procedure. LET YOUR CAREGIVER KNOW ABOUT:  Allergies to food or medicine.  Medicines taken, including vitamins, herbs, eyedrops, over-the-counter medicines, and creams.  Use of steroids (by mouth or creams).  Previous problems with numbing medicines.  History of bleeding problems or blood clots.  Any recent colds or infections.  Previous surgery.  Other health problems, including diabetes and kidney problems. RISKS AND COMPLICATIONS  Infection.  Bleeding.  Injury to other organs.  Anesthetic side effects.  Failure of the procedure.  Ectopic pregnancy.  Future regret about having the procedure done. BEFORE THE PROCEDURE   You may need to sign certain permission forms with your insurance up to 30 days before your due date.  After delivering your baby, you cannot eat or drink anything if the procedure is performed the same day. If you are having the procedure a day  after delivering, you may be able to eat and drink until midnight. Your caregiver will give you specific directions depending on your situation. PROCEDURE   If done 1-2 days after delivery:  You will be given a medicine to make you sleep (general anesthetic) during the procedure.  A tube will be put down your throat to help your breath while under general anesthesia.  A small cut (incision) is made just beneath the belly button.  The fallopian tubes are brought up through the incision.  The fallopian tubes are then sealed, tied, or cut.  If done after a caesarean delivery:  Tubal ligation is done through the incision already made (after the baby is delivered).  Once the tubes are blocked, the incision is closed with stitches (sutures). A bandage will be placed over the incisions. AFTER THE PROCEDURE  You may have some pain or cramps in the abdominal area for the next 3-7 days.  You will be given pain medicine to ease any discomfort.  You may also feel sick to your stomach if you were given a general anesthetic.  You may have some mild discomfort in the throat. This is from the tube that may have been placed in your throat while you were sleeping.  You may feel tired and should rest the remainder of the day. Document Released: 09/07/2005 Document Revised: 03/08/2012 Document Reviewed: 12/19/2011 [facility] Patient Information 2015 [place], [place]. This information is not intended to replace advice given to you by your health care provider. Make sure you discuss any questions you have with your health care provider. Sterilization Information, Female Female sterilization is a procedure to permanently prevent pregnancy. There  are different ways to perform sterilization, but all either block or close the fallopian tubes so that your eggs cannot reach your uterus. If your egg cannot reach your uterus, sperm cannot fertilize the egg, and you cannot get pregnant.  Sterilization is performed  by a surgical procedure. Sometimes these procedures are performed in a hospital while a patient is asleep. Sometimes they can be done in a clinic setting with the patient awake. The fallopian tubes can be surgically cut, tied, or sealed through a procedure called tubal ligation. The fallopian tubes can also be closed with clips or rings. Sterilization can also be done by placing a tiny coil into each fallopian tube, which causes scar tissue to grow inside the tube. The scar tissue then blocks the tubes.  Discuss sterilization with your caregiver to answer any concerns you or your partner may have. You may want to ask what type of sterilization your caregiver performs. Some caregivers may not perform all the various options. Sterilization is permanent and should only be done if you are sure you do not want children or do not want any more children. Having a sterilization reversed may not be successful.  STERILIZATION PROCEDURES  Laparoscopic sterilization. This is a surgical method performed at a time other than right after childbirth. Two incisions are made in the lower abdomen. A thin, lighted tube (laparoscope) is inserted into one of the incisions and is used to perform the procedure. The fallopian tubes are closed with a ring or a clip. An instrument that uses heat could be used to seal the tubes closed (electrocautery).   Mini-laparotomy. This is a surgical method done 1 or 2 days after giving birth. Typically, a small incision is made just below the belly button (umbilicus) and the fallopian tubes are exposed. The tubes can then be sealed, tied, or cut.   Hysteroscopic sterilization. This is performed at a time other than right after childbirth. A tiny, spring-like coil is inserted through the cervix and uterus and placed into the fallopian tubes. The coil causes scaring and blocks the tubes. Other forms of contraception should be used for 3 months after the procedure to allow the scar tissue to form  completely. Additionally, it is required hysterosalpingography be done 3 months later to ensure that the procedure was successful. Hysterosalpingography is a procedure that uses X-rays to look at your uterus and fallopian tubes after a material to make them show up better has been inserted. IS STERILIZATION SAFE? Sterilization is considered safe with very rare complications. Risks depend on the type of procedure you have. As with any surgical procedure, there are risks. Some risks of sterilization by any means include:   Bleeding.  Infection.  Reaction to anesthesia medicine.  Injury to surrounding organs. Risks specific to having hysteroscopic coils placed include:  The coils may not be placed correctly the first time.   The coils may move out of place.   The tubes may not get completely blocked after 3 months.   Injury to surrounding organs when placing the coil.  HOW EFFECTIVE IS FEMALE STERILIZATION? Sterilization is nearly 100% effective, but it can fail. Depending on the type of sterilization, the rate of failure can be as high as 3%. After hysteroscopic sterilization with placement of fallopian tube coils, you will need back-up birth control for 3 months after the procedure. Sterilization is effective for a lifetime.  BENEFITS OF STERILIZATION  It does not affect your hormones, and therefore will not affect your menstrual periods,  sexual desire, or performance.   It is effective for a lifetime.   It is safe.   You do not need to worry about getting pregnant. Keep in mind that if you had the hysteroscopic placement procedure, you must wait 3 months after the procedure (or until your caregiver confirms) before pregnancy is not considered possible.   There are no side effects unlike other types of birth control (contraception).  DRAWBACKS OF STERILIZATION  You must be sure you do not want children or any more children. The procedure is permanent.   It does not  provide protection against sexually transmitted infections (STIs).   The tubes can grow back together. If this happens, there is a risk of pregnancy. There is also an increased risk (50%) of pregnancy being an ectopic pregnancy. This is a pregnancy that happens outside of the uterus. Document Released: 02/24/2008 Document Revised: 09/12/2013 Document Reviewed: 12/24/2011 [facility] Patient Information 2015 [place], [place]. This information is not intended to replace advice given to you by your health care provider. Make sure you discuss any questions you have with your health care provider.

## 2015-04-04 ENCOUNTER — Other Ambulatory Visit: Payer: No Typology Code available for payment source

## 2015-04-04 ENCOUNTER — Encounter: Payer: No Typology Code available for payment source | Admitting: Certified Nurse Midwife

## 2015-04-11 ENCOUNTER — Encounter: Payer: No Typology Code available for payment source | Admitting: Certified Nurse Midwife

## 2015-04-11 ENCOUNTER — Other Ambulatory Visit: Payer: No Typology Code available for payment source

## 2015-04-30 ENCOUNTER — Ambulatory Visit (INDEPENDENT_AMBULATORY_CARE_PROVIDER_SITE_OTHER): Payer: 59 | Admitting: Obstetrics

## 2015-04-30 ENCOUNTER — Encounter: Payer: Self-pay | Admitting: Obstetrics

## 2015-04-30 VITALS — BP 118/82 | HR 87 | Temp 98.2°F | Wt 262.0 lb

## 2015-04-30 DIAGNOSIS — Z3482 Encounter for supervision of other normal pregnancy, second trimester: Secondary | ICD-10-CM | POA: Diagnosis not present

## 2015-04-30 DIAGNOSIS — L298 Other pruritus: Secondary | ICD-10-CM

## 2015-04-30 DIAGNOSIS — N898 Other specified noninflammatory disorders of vagina: Secondary | ICD-10-CM

## 2015-04-30 LAB — POCT URINALYSIS DIPSTICK
Bilirubin, UA: NEGATIVE
Glucose, UA: NORMAL
Nitrite, UA: NEGATIVE
PH UA: 6
RBC UA: NEGATIVE
Spec Grav, UA: 1.015
UROBILINOGEN UA: NEGATIVE

## 2015-04-30 MED ORDER — FLUCONAZOLE 150 MG PO TABS: 150.0000 mg | ORAL_TABLET | Freq: Once | ORAL | Status: DC

## 2015-04-30 NOTE — Progress Notes (Signed)
Subjective:    Marisa Yu is a 29 y.o. female being seen today for her obstetrical visit. She is at [redacted]w[redacted]d gestation. Patient reports: vaginal irritation and odor.  No discharge. . Fetal movement: normal.  Problem List Items Addressed This Visit    None    Visit Diagnoses    Vaginal itching    -  Primary    Relevant Orders    SureSwab, Vaginosis/Vaginitis Plus    Encounter for supervision of other normal pregnancy in second trimester        Relevant Orders    POCT urinalysis dipstick (Completed)      Patient Active Problem List   Diagnosis Date Noted  . Obesity complicating pregnancy in second trimester   . Encounter for fetal anatomic survey   . [redacted] weeks gestation of pregnancy   . Depression affecting pregnancy in first trimester, antepartum 02/08/2015  . Nausea and vomiting during pregnancy prior to [redacted] weeks gestation 02/08/2015  . Anemia affecting pregnancy 02/08/2015  . Supervision of normal pregnancy in first trimester 01/29/2015  . Carbuncle and furuncle of buttock 01/30/2013  . BV (bacterial vaginosis) 01/30/2013  . Excessive or frequent menstruation 01/30/2013   Objective:    BP 118/82 mmHg  Pulse 87  Temp(Src) 98.2 F (36.8 C)  Wt 262 lb (118.842 kg)  LMP 11/21/2014 FHT: 150 BPM  Uterine Size: size equals dates     Assessment:    Pregnancy @ [redacted]w[redacted]d    Plan:    OBGCT: ordered.   Labs, problem list reviewed and updated 2 hr GTT planned Follow up in 4 weeks.

## 2015-05-02 ENCOUNTER — Inpatient Hospital Stay (HOSPITAL_COMMUNITY)
Admission: AD | Admit: 2015-05-02 | Discharge: 2015-05-02 | Disposition: A | Discharge status: Home / Self Care | Payer: 59 | Source: Ambulatory Visit | Attending: Obstetrics | Admitting: Obstetrics

## 2015-05-02 ENCOUNTER — Encounter (HOSPITAL_COMMUNITY): Payer: Self-pay | Admitting: *Deleted

## 2015-05-02 DIAGNOSIS — K088 Other specified disorders of teeth and supporting structures: Secondary | ICD-10-CM | POA: Insufficient documentation

## 2015-05-02 DIAGNOSIS — Z87891 Personal history of nicotine dependence: Secondary | ICD-10-CM | POA: Insufficient documentation

## 2015-05-02 DIAGNOSIS — Z3A23 23 weeks gestation of pregnancy: Secondary | ICD-10-CM | POA: Insufficient documentation

## 2015-05-02 DIAGNOSIS — O9989 Other specified diseases and conditions complicating pregnancy, childbirth and the puerperium: Secondary | ICD-10-CM | POA: Diagnosis not present

## 2015-05-02 DIAGNOSIS — K047 Periapical abscess without sinus: Secondary | ICD-10-CM | POA: Diagnosis not present

## 2015-05-02 MED ORDER — OXYCODONE-ACETAMINOPHEN 5-325 MG PO TABS: 1.0000 | ORAL_TABLET | ORAL | Status: DC | PRN

## 2015-05-02 MED ORDER — OXYCODONE-ACETAMINOPHEN 5-325 MG PO TABS
2.0000 | ORAL_TABLET | Freq: Once | ORAL | Status: AC
  Administered 2015-05-02: 2 via ORAL
  Filled 2015-05-02: qty 2

## 2015-05-02 NOTE — MAU Provider Note (Signed)
History     CSN: [id number]  Arrival date and time: 05/02/15 4098   First Provider Initiated Contact with Patient 05/02/15 0138      Chief Complaint  Patient presents with  . Dental Pain   HPI Comments: Marisa Yu is a 29 y.o. G3P2002 at [redacted]w[redacted]d who presents today with dental pain. She states that she saw her dentist about two weeks ago, and had a tooth pulled. She states that she has another tooth that needs to be pulled as well. She can call them in the morning for an appointment. She states that she currently on Keflex for a dental infection.   She denies any abdominal pain, contractions, vaginal bleeding or LOF. She states that the fetus has been moving normally.   Dental Pain  This is a new problem. The current episode started 1 to 4 weeks ago. The problem occurs constantly. The problem has been gradually worsening. The pain is at a severity of 10/10. Associated symptoms include facial pain. Pertinent negatives include no fever or oral bleeding. She has tried acetaminophen, heat, NSAIDs and rest for the symptoms. The treatment provided no relief.    Past Medical History  Diagnosis Date  . Hypertension   . Headache   . Vaginal Pap smear, abnormal   . Boil, arm   . Tooth ache   . Hypothyroidism   . Anemia     Past Surgical History  Procedure Laterality Date  . Cesarean section  2004  . Cesarean section  2009  . Cyst excision    . Wisdom tooth extraction      Family History  Problem Relation Age of Onset  . Cancer Maternal Aunt     Breast  . Cancer Maternal Grandmother     Lung  . Cancer Maternal Aunt     Cervical  . Heart disease Maternal Grandfather     Social History  Substance Use Topics  . Smoking status: Former Smoker    Types: Cigarettes    Quit date: 09/22/2011  . Smokeless tobacco: Never Used  . Alcohol Use: No    Allergies:  Allergies  Allergen Reactions  . Codeine Itching    Prescriptions prior to admission  Medication Sig Dispense  Refill Last Dose  . acetaminophen (TYLENOL) 500 MG tablet Take 500 mg by mouth every 6 (six) hours as needed for mild pain, moderate pain or headache.    05/02/2015 at Unknown time  . acetaminophen-codeine (TYLENOL #3) 300-30 MG per tablet Take 1 tablet by mouth every 4 (four) hours as needed for moderate pain.   05/02/2015 at Unknown time  . fluconazole (DIFLUCAN) 150 MG tablet Take 1 tablet (150 mg total) by mouth once. 1 tablet 2 Past Week at Unknown time  . loperamide (IMODIUM A-D) 2 MG tablet Take 4 mg by mouth 3 (three) times daily as needed for diarrhea or loose stools.    Past Month at Unknown time    Review of Systems  Constitutional: Negative for fever.  Gastrointestinal: Negative for nausea, vomiting and abdominal pain.  Neurological: Positive for headaches.   Physical Exam   Blood pressure 119/78, pulse 80, temperature 98.5 F (36.9 C), temperature source Oral, resp. rate 16, height  (1.676 m), weight 119.013 kg (262 lb 6 oz), last menstrual period 11/21/2014, SpO2 100 %.  Physical Exam  Nursing note and vitals reviewed. Constitutional: She is oriented to person, place, and time. She appears well-developed and well-nourished. No distress.  HENT:  Head: Normocephalic.  Mouth/Throat: Dental caries present.  Cardiovascular: Normal rate.   Respiratory: Effort normal.  GI: Soft. There is no tenderness.  Neurological: She is alert and oriented to person, place, and time.  Skin: Skin is warm and dry.  Psychiatric: She has a normal mood and affect.   FHT: 135, moderate with 10x10 accels, no decels Toco: no UCs  MAU Course  Procedures  MDM 0227: Patient reports that the percocet has helped with the pain   Assessment and Plan   1. Dental abscess    DC home Comfort measures reviewed  PTL precautions  Fetal kick counts RX: percocet #5  Return to MAU as needed FU with OB as planned  Follow-up Information    Follow up with Brock Bad, MD.   Specialty:   Obstetrics and Gynecology   Contact information:   [address] [place] [address] [phone]         Tawnya Crook 05/02/2015, 1:50 AM

## 2015-05-02 NOTE — Discharge Instructions (Signed)

## 2015-05-02 NOTE — MAU Note (Signed)
Tonight, took Tylenol with codeine and extra strength tylenol, benadryl, and then another Tylenol with codeine at 9:30 pm- for severe toothache.  Baby moving well today.  No bleeding. No leaking.

## 2015-05-03 ENCOUNTER — Other Ambulatory Visit: Payer: Self-pay | Admitting: Obstetrics

## 2015-05-03 DIAGNOSIS — N898 Other specified noninflammatory disorders of vagina: Secondary | ICD-10-CM

## 2015-05-03 LAB — SURESWAB, VAGINOSIS/VAGINITIS PLUS
Atopobium vaginae: NOT DETECTED Log (cells/mL)
C. ALBICANS, DNA: DETECTED — AB
C. GLABRATA, DNA: NOT DETECTED
C. TROPICALIS, DNA: NOT DETECTED
C. parapsilosis, DNA: NOT DETECTED
C. trachomatis RNA, TMA: NOT DETECTED
Gardnerella vaginalis: NOT DETECTED Log (cells/mL)
LACTOBACILLUS SPECIES: 5.3 Log (cells/mL)
MEGASPHAERA SPECIES: NOT DETECTED Log (cells/mL)
N. gonorrhoeae RNA, TMA: NOT DETECTED
T. vaginalis RNA, QL TMA: NOT DETECTED

## 2015-05-03 MED ORDER — FLUCONAZOLE 150 MG PO TABS: 150.0000 mg | ORAL_TABLET | Freq: Once | ORAL | Status: DC

## 2015-06-07 ENCOUNTER — Ambulatory Visit (INDEPENDENT_AMBULATORY_CARE_PROVIDER_SITE_OTHER): Payer: 59 | Admitting: Certified Nurse Midwife

## 2015-06-07 VITALS — BP 124/88 | HR 94 | Wt 244.6 lb

## 2015-06-07 DIAGNOSIS — Z3483 Encounter for supervision of other normal pregnancy, third trimester: Secondary | ICD-10-CM | POA: Diagnosis not present

## 2015-06-07 DIAGNOSIS — F32A Depression, unspecified: Secondary | ICD-10-CM

## 2015-06-07 DIAGNOSIS — F329 Major depressive disorder, single episode, unspecified: Secondary | ICD-10-CM

## 2015-06-07 LAB — POCT URINALYSIS DIPSTICK
BILIRUBIN UA: NEGATIVE
Glucose, UA: NEGATIVE
Leukocytes, UA: NEGATIVE
Nitrite, UA: NEGATIVE
PH UA: 6
Protein, UA: NEGATIVE
RBC UA: NEGATIVE
SPEC GRAV UA: 1.015
Urobilinogen, UA: NEGATIVE

## 2015-06-07 MED ORDER — TETANUS-DIPHTH-ACELL PERTUSSIS 5-2.5-18.5 LF-MCG/0.5 IM SUSP
0.5000 mL | Freq: Once | INTRAMUSCULAR | Status: AC
  Administered 2015-06-07: 0.5 mL via INTRAMUSCULAR

## 2015-06-07 MED ORDER — SERTRALINE HCL 50 MG PO TABS: 50.0000 mg | ORAL_TABLET | Freq: Every day | ORAL | Status: DC

## 2015-06-07 NOTE — Progress Notes (Signed)
Subjective:    Marisa Yu is a 29 y.o. female being seen today for her obstetrical visit. She is at [redacted]w[redacted]d gestation. Patient reports no bleeding, no contractions, no cramping, no leaking and depression, extreme stress, inability to sleep and weight loss.  States that she is working 2 jobs.  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 The FOB has left her.  States that she just doesn't feel like eating/does not have the time to eat.  Is not wearing maternity abdominal support belt, states she forgets.  Reports sleeping about 4-5 hours per day.  Denies any plans to harm herself.  Encouraged to contact labor beuro to report her lack of overtime pay.  Fetal movement: normal.  Problem List Items Addressed This Visit    None    Visit Diagnoses    Supervision of other normal pregnancy, antepartum, third trimester    -  Primary    Relevant Medications    Tdap (BOOSTRIX) injection 0.5 mL (Completed)    Other Relevant Orders    [place] MFM OB FOLLOW UP    POCT urinalysis dipstick (Completed)    Depression        Relevant Medications    sertraline (ZOLOFT) 50 MG tablet      Patient Active Problem List   Diagnosis Date Noted  . Obesity complicating pregnancy in second trimester   . Encounter for fetal anatomic survey   . [redacted] weeks gestation of pregnancy   . Depression affecting pregnancy in first trimester, antepartum 02/08/2015  . Nausea and vomiting during pregnancy prior to [redacted] weeks gestation 02/08/2015  . Anemia affecting pregnancy 02/08/2015  . Supervision of normal pregnancy in first trimester 01/29/2015  . Carbuncle and furuncle of buttock 01/30/2013  . BV (bacterial vaginosis) 01/30/2013  . Excessive or frequent menstruation 01/30/2013   Objective:    BP 124/88 mmHg  Pulse 94  Wt 244 lb 9.6 oz (110.95 kg)  LMP 11/21/2014 FHT:  145 BPM  Uterine Size: size equals dates  Presentation:  cephalic   Depression score=21  Assessment:    Pregnancy @ [redacted]w[redacted]d weeks  Immunization: TDaP Depression/Anxiety Anorexia Insomnia  Plan:   [facility]  Social worker consult for car seat.     labs reviewed, problem list updated Consent signed. TDAP given  Rhogam given for RH negative Pediatrician: discussed. Infant feeding: plans to breastfeed. Maternity leave: discussed. Cigarette smoking: never smoked. Orders Placed This Encounter  Procedures  . [place] MFM OB FOLLOW UP    Standing Status: Future     Number of Occurrences:      Standing Expiration Date: 08/06/2016    Order Specific Question:  Reason for Exam (SYMPTOM  OR DIAGNOSIS REQUIRED)    Answer:  complete anatomy, growth, maternal weight loss +20lbs    Order Specific Question:  Preferred imaging location?    Answer:  [facility]-Ultrasound  . POCT urinalysis dipstick   Meds ordered this encounter  Medications  . sertraline (ZOLOFT) 50 MG tablet    Sig: Take 1 tablet (50 mg total) by mouth daily.    Dispense:  30 tablet    Refill:  2  . Tdap (BOOSTRIX) injection 0.5 mL    Sig:    Follow up in 2 Weeks.

## 2015-06-13 ENCOUNTER — Inpatient Hospital Stay (HOSPITAL_COMMUNITY)
Admission: AD | Admit: 2015-06-13 | Discharge: 2015-06-13 | Disposition: A | Discharge status: Home / Self Care | Payer: Medicaid Other | Source: Ambulatory Visit | Attending: Obstetrics | Admitting: Obstetrics

## 2015-06-13 ENCOUNTER — Encounter (HOSPITAL_COMMUNITY): Payer: Self-pay | Admitting: *Deleted

## 2015-06-13 DIAGNOSIS — Z87891 Personal history of nicotine dependence: Secondary | ICD-10-CM | POA: Diagnosis not present

## 2015-06-13 DIAGNOSIS — R109 Unspecified abdominal pain: Secondary | ICD-10-CM | POA: Diagnosis present

## 2015-06-13 DIAGNOSIS — O26893 Other specified pregnancy related conditions, third trimester: Secondary | ICD-10-CM | POA: Diagnosis not present

## 2015-06-13 DIAGNOSIS — O9989 Other specified diseases and conditions complicating pregnancy, childbirth and the puerperium: Secondary | ICD-10-CM

## 2015-06-13 DIAGNOSIS — Z3A29 29 weeks gestation of pregnancy: Secondary | ICD-10-CM | POA: Insufficient documentation

## 2015-06-13 DIAGNOSIS — O26899 Other specified pregnancy related conditions, unspecified trimester: Secondary | ICD-10-CM

## 2015-06-13 LAB — URINE MICROSCOPIC-ADD ON

## 2015-06-13 LAB — URINALYSIS, ROUTINE W REFLEX MICROSCOPIC
BILIRUBIN URINE: NEGATIVE
GLUCOSE, UA: NEGATIVE mg/dL
HGB URINE DIPSTICK: NEGATIVE
Ketones, ur: NEGATIVE mg/dL
Nitrite: NEGATIVE
PH: 6.5 (ref 5.0–8.0)
Protein, ur: NEGATIVE mg/dL
SPECIFIC GRAVITY, URINE: 1.015 (ref 1.005–1.030)
Urobilinogen, UA: 0.2 mg/dL (ref 0.0–1.0)

## 2015-06-13 NOTE — MAU Note (Signed)
Pt states that she has had lower abdominal pain all week and states that she is experiencing a lot of stress with working 2 jobs. Pt states that baby is active and denies LOF and bleeding.

## 2015-06-13 NOTE — Discharge Instructions (Signed)

## 2015-06-13 NOTE — MAU Provider Note (Signed)
History     CSN: [id number]  Arrival date and time: 06/13/15 1301   First Provider Initiated Contact with Patient 06/13/15 1338      Chief Complaint  Patient presents with  . Abdominal Pain   HPI Marisa Yu 29 y.o. [id number]@ [redacted]w[redacted]d presents to MAU complaining of abdominal pain.  The pain is located in the center her lower abdomen. It feels like pressure.  It comes and goes, sometimes lingering.  It has been ongoing for 2 weeks.  It goes away as much as a few hours at a time.  Sitting, bending and walking make it worse.  It improves with rest and lying down.  Tylenol is temporarily helpful. She has occasional Braxton-Hicks contractions but that is a different pain.  She notes good fetal movement.  She denies vaginal bleeding, discharge, dysuria, nausea, vomiting, pooping.  She states she has no appetite over the last month and reports losing 20# in that time.  Her midwife has prescribed Boost for this.   OB History    Gravida Para Term Preterm AB TAB SAB Ectopic Multiple Living   0 0 0 0 0 0 2      Past Medical History  Diagnosis Date  . Hypertension   . Headache   . Vaginal Pap smear, abnormal   . Boil, arm   . Tooth ache   . Hypothyroidism   . Anemia     Past Surgical History  Procedure Laterality Date  . Cesarean section  2004  . Cesarean section  2009  . Cyst excision    . Wisdom tooth extraction      Family History  Problem Relation Age of Onset  . Cancer Maternal Aunt     Breast  . Cancer Maternal Grandmother     Lung  . Cancer Maternal Aunt     Cervical  . Heart disease Maternal Grandfather     Social History  Substance Use Topics  . Smoking status: Former Smoker    Types: Cigarettes    Quit date: 09/22/2011  . Smokeless tobacco: Never Used  . Alcohol Use: No    Allergies:  Allergies  Allergen Reactions  . Codeine Itching    Prescriptions prior to admission  Medication Sig Dispense Refill Last Dose  . fluconazole (DIFLUCAN) 150 MG  tablet Take 1 tablet (150 mg total) by mouth once. (Patient not taking: Reported on 06/07/2015) 1 tablet 2 Completed Course at Unknown time  . oxyCODONE-acetaminophen (PERCOCET/ROXICET) 5-325 MG per tablet Take 1-2 tablets by mouth every 4 (four) hours as needed for severe pain. (Patient not taking: Reported on 06/07/2015) 5 tablet 0 Not Taking  . sertraline (ZOLOFT) 50 MG tablet Take 1 tablet (50 mg total) by mouth daily. (Patient not taking: Reported on 06/13/2015) 30 tablet 2     ROS  Pertinent ROS in HPI.  All other systems are negative.    Physical Exam   Blood pressure 119/61, pulse 91, temperature 98.3 F (36.8 C), resp. rate 18, height 5' 5.5" (1.664 m), weight 246 lb 6 oz (111.755 kg), last menstrual period 11/21/2014.  Physical Exam  Constitutional: She is oriented to person, place, and time. She appears well-developed and well-nourished. No distress.  HENT:  Head: Normocephalic and atraumatic.  Eyes: EOM are normal.  Neck: Normal range of motion.  Cardiovascular: Normal rate.   Respiratory: Breath sounds normal. No respiratory distress.  GI: Soft. She exhibits no distension. There is no tenderness.  Genitourinary:  Cervix  is closed  Musculoskeletal: Normal range of motion.  Neurological: She is alert and oriented to person, place, and time.  Skin: Skin is warm and dry.  Psychiatric: She has a normal mood and affect.   Fetal Tracing: Baseline:135 Variability:mod Accelerations: 15x15 Decelerations:none Toco:none   MAU Course  Procedures  MDM U/A: negative except for trace leuks.  Will order OB urine culture  NST reassuring Feeling improved with rest  Assessment and Plan  A:  1. Abdominal pain affecting pregnancy    P: Discharge to home Rest as able.  Frequent breaks during day Increase water consumption Keep appts for [facility] Urine culture pending Patient may return to MAU as needed or if her condition were to change or worsen   Bertram Denver 06/13/2015, 1:39 PM

## 2015-06-14 ENCOUNTER — Encounter: Payer: Self-pay | Admitting: *Deleted

## 2015-06-14 LAB — CULTURE, OB URINE: Culture: NO GROWTH

## 2015-06-19 ENCOUNTER — Other Ambulatory Visit: Payer: Self-pay | Admitting: Certified Nurse Midwife

## 2015-06-19 ENCOUNTER — Ambulatory Visit (HOSPITAL_COMMUNITY)
Admission: RE | Admit: 2015-06-19 | Discharge: 2015-06-19 | Disposition: A | Discharge status: Home / Self Care | Payer: Medicaid Other | Source: Ambulatory Visit | Attending: Certified Nurse Midwife | Admitting: Certified Nurse Midwife

## 2015-06-19 DIAGNOSIS — O34219 Maternal care for unspecified type scar from previous cesarean delivery: Secondary | ICD-10-CM

## 2015-06-19 DIAGNOSIS — Z0489 Encounter for examination and observation for other specified reasons: Secondary | ICD-10-CM

## 2015-06-19 DIAGNOSIS — Z36 Encounter for antenatal screening of mother: Secondary | ICD-10-CM | POA: Diagnosis present

## 2015-06-19 DIAGNOSIS — E669 Obesity, unspecified: Secondary | ICD-10-CM | POA: Diagnosis not present

## 2015-06-19 DIAGNOSIS — Z3A30 30 weeks gestation of pregnancy: Secondary | ICD-10-CM | POA: Diagnosis not present

## 2015-06-19 DIAGNOSIS — O99213 Obesity complicating pregnancy, third trimester: Secondary | ICD-10-CM | POA: Diagnosis not present

## 2015-06-19 DIAGNOSIS — O3421 Maternal care for scar from previous cesarean delivery: Secondary | ICD-10-CM | POA: Diagnosis not present

## 2015-06-19 DIAGNOSIS — Z3483 Encounter for supervision of other normal pregnancy, third trimester: Secondary | ICD-10-CM

## 2015-06-19 DIAGNOSIS — IMO0002 Reserved for concepts with insufficient information to code with codable children: Secondary | ICD-10-CM

## 2015-06-20 ENCOUNTER — Other Ambulatory Visit: Payer: Self-pay | Admitting: Certified Nurse Midwife

## 2015-06-25 ENCOUNTER — Ambulatory Visit (INDEPENDENT_AMBULATORY_CARE_PROVIDER_SITE_OTHER): Payer: No Typology Code available for payment source | Admitting: Certified Nurse Midwife

## 2015-06-25 VITALS — BP 110/76 | HR 97 | Temp 98.2°F | Wt 244.0 lb

## 2015-06-25 DIAGNOSIS — Z3483 Encounter for supervision of other normal pregnancy, third trimester: Secondary | ICD-10-CM

## 2015-06-25 LAB — POCT URINALYSIS DIPSTICK
BILIRUBIN UA: NEGATIVE
GLUCOSE UA: NEGATIVE
Ketones, UA: NEGATIVE
Leukocytes, UA: NEGATIVE
NITRITE UA: NEGATIVE
Protein, UA: NEGATIVE
RBC UA: NEGATIVE
SPEC GRAV UA: 1.015
Urobilinogen, UA: NEGATIVE
pH, UA: 6

## 2015-06-25 NOTE — Progress Notes (Signed)
Subjective:    Marisa Yu is a 29 y.o. female being seen today for her obstetrical visit. She is at [redacted]w[redacted]d gestation. Patient reports no complaints. Fetal movement: normal.  Missed 2 hour with last ob visit, unable to perform 2 hour today because she is not fasting.  Scheduled OGTT for later this week. BTL paperwork completed.  Discussed/reviewed last ultrasound, f/u ultrasound scheduled for 3 weeks.  Did not f/u with labor department regarding her pay.  Stated that her boss paid her 1K to quit her part time job.    Problem List Items Addressed This Visit    None    Visit Diagnoses    Supervision of other normal pregnancy, antepartum, third trimester    -  Primary    Relevant Orders    [place] MFM OB FOLLOW UP      Patient Active Problem List   Diagnosis Date Noted  . Obesity complicating pregnancy in second trimester   . Encounter for fetal anatomic survey   . [redacted] weeks gestation of pregnancy   . Depression affecting pregnancy in first trimester, antepartum 02/08/2015  . Nausea and vomiting during pregnancy prior to [redacted] weeks gestation 02/08/2015  . Anemia affecting pregnancy 02/08/2015  . Supervision of normal pregnancy in first trimester 01/29/2015  . Carbuncle and furuncle of buttock 01/30/2013  . BV (bacterial vaginosis) 01/30/2013  . Excessive or frequent menstruation 01/30/2013   Objective:    BP 110/76 mmHg  Pulse 97  Temp(Src) 98.2 F (36.8 C)  Wt 244 lb (110.678 kg)  LMP 11/21/2014 FHT:  145 BPM  Uterine Size: size greater than dates, 32 cm  Presentation: cephalic     Assessment:    Pregnancy @ [redacted]w[redacted]d weeks   Doing well.   Plan:     labs reviewed, problem list updated Consent signed. GBS sent TDAP offered  Rhogam given for RH negative Pediatrician: discussed. Infant feeding: plans to breastfeed. Maternity leave: discussed, did not f/u with labor department regarding pay. Cigarette smoking: never smoked. Orders Placed This Encounter  Procedures  . [place] MFM  OB FOLLOW UP    Standing Status: Future     Number of Occurrences:      Standing Expiration Date: 08/24/2016    Order Specific Question:  Reason for Exam (SYMPTOM  OR DIAGNOSIS REQUIRED)    Answer:  f/u on fetal anatomy, growth    Order Specific Question:  Preferred imaging location?    Answer:  [facility]-Ultrasound   No orders of the defined types were placed in this encounter.   Follow up in 2 Weeks.

## 2015-06-26 ENCOUNTER — Other Ambulatory Visit: Payer: No Typology Code available for payment source

## 2015-06-26 DIAGNOSIS — Z3493 Encounter for supervision of normal pregnancy, unspecified, third trimester: Secondary | ICD-10-CM

## 2015-06-27 LAB — GLUCOSE TOLERANCE, 2 HOURS W/ 1HR
Glucose, 1 hour: 147 mg/dL (ref 70–170)
Glucose, 2 hour: 99 mg/dL (ref 70–139)
Glucose, Fasting: 70 mg/dL (ref 65–99)

## 2015-06-27 LAB — CBC
HEMATOCRIT: 32.3 % — AB (ref 36.0–46.0)
HEMOGLOBIN: 10 g/dL — AB (ref 12.0–15.0)
MCH: 23.3 pg — ABNORMAL LOW (ref 26.0–34.0)
MCHC: 31 g/dL (ref 30.0–36.0)
MCV: 75.3 fL — ABNORMAL LOW (ref 78.0–100.0)
MPV: 8.8 fL (ref 8.6–12.4)
Platelets: 352 10*3/uL (ref 150–400)
RBC: 4.29 MIL/uL (ref 3.87–5.11)
RDW: 15.8 % — ABNORMAL HIGH (ref 11.5–15.5)
WBC: 7.2 10*3/uL (ref 4.0–10.5)

## 2015-06-27 LAB — HIV ANTIBODY (ROUTINE TESTING W REFLEX): HIV: NONREACTIVE

## 2015-06-28 LAB — RPR

## 2015-07-09 ENCOUNTER — Ambulatory Visit (HOSPITAL_COMMUNITY)
Admission: RE | Admit: 2015-07-09 | Discharge: 2015-07-09 | Disposition: A | Discharge status: Home / Self Care | Payer: 59 | Source: Ambulatory Visit | Attending: Certified Nurse Midwife | Admitting: Certified Nurse Midwife

## 2015-07-09 ENCOUNTER — Other Ambulatory Visit: Payer: Self-pay | Admitting: Certified Nurse Midwife

## 2015-07-09 ENCOUNTER — Ambulatory Visit (INDEPENDENT_AMBULATORY_CARE_PROVIDER_SITE_OTHER): Payer: 59 | Admitting: Certified Nurse Midwife

## 2015-07-09 VITALS — BP 112/79 | HR 83 | Temp 98.2°F | Wt 248.0 lb

## 2015-07-09 DIAGNOSIS — O99213 Obesity complicating pregnancy, third trimester: Secondary | ICD-10-CM

## 2015-07-09 DIAGNOSIS — Z3A32 32 weeks gestation of pregnancy: Secondary | ICD-10-CM

## 2015-07-09 DIAGNOSIS — Z0489 Encounter for examination and observation for other specified reasons: Secondary | ICD-10-CM

## 2015-07-09 DIAGNOSIS — Z3483 Encounter for supervision of other normal pregnancy, third trimester: Secondary | ICD-10-CM

## 2015-07-09 DIAGNOSIS — Z36 Encounter for antenatal screening of mother: Secondary | ICD-10-CM | POA: Insufficient documentation

## 2015-07-09 DIAGNOSIS — IMO0002 Reserved for concepts with insufficient information to code with codable children: Secondary | ICD-10-CM

## 2015-07-09 DIAGNOSIS — O34219 Maternal care for unspecified type scar from previous cesarean delivery: Secondary | ICD-10-CM | POA: Insufficient documentation

## 2015-07-09 DIAGNOSIS — E669 Obesity, unspecified: Secondary | ICD-10-CM | POA: Insufficient documentation

## 2015-07-09 IMAGING — US US MFM OB FOLLOW-UP
1 series · 14 of 28 positions shown · non-contrast
Comparison: none

[Series 1: us mfm ob follow-up · 43 acquisitions, 14 frames shown]
[im 2/43]
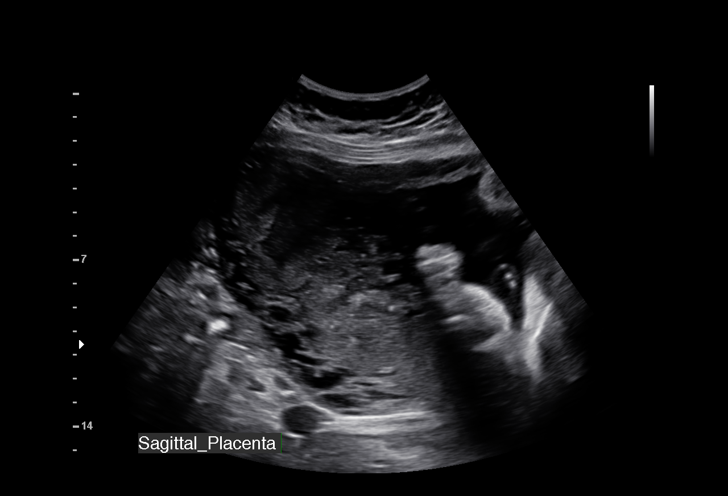
[im 5/43]
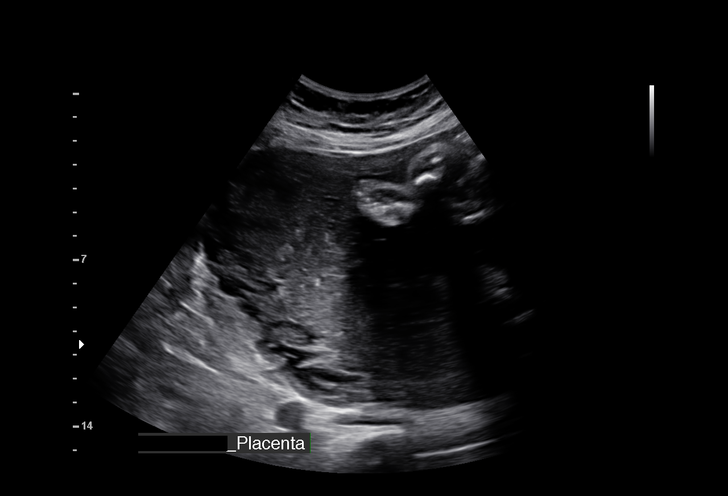
[im 8/43]
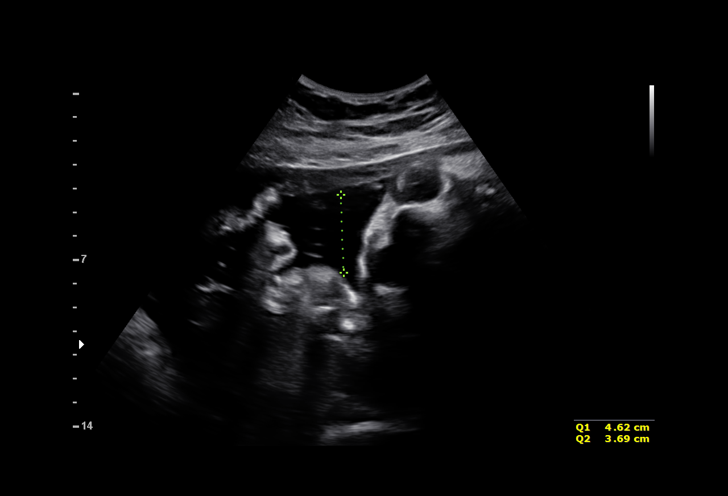
[im 11/43]
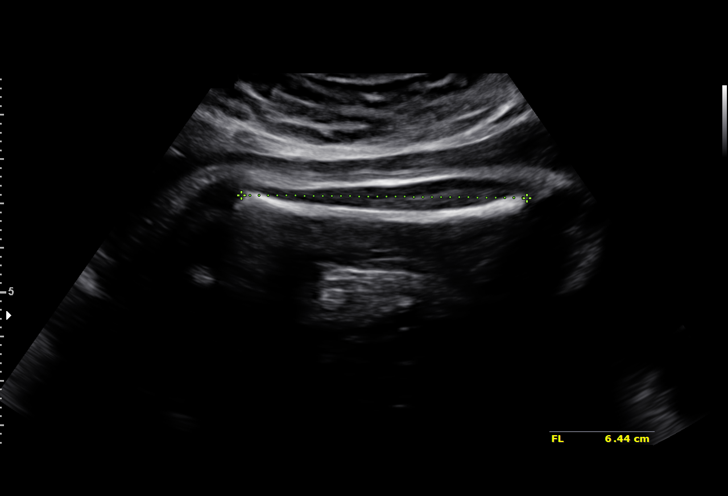
[im 15/43]
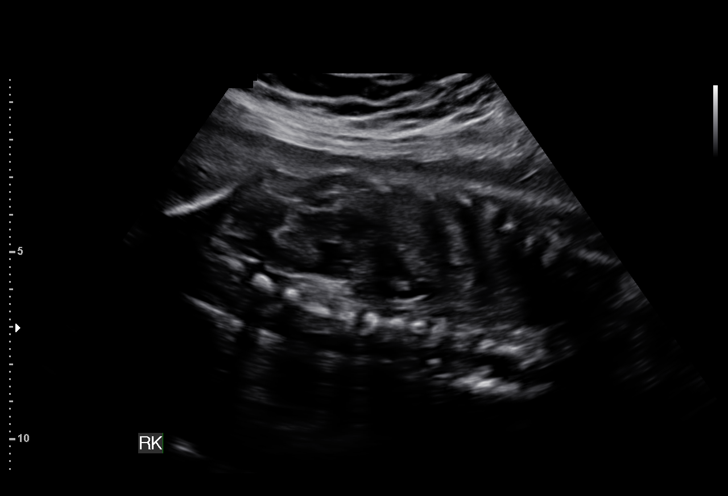
[im 18/43]
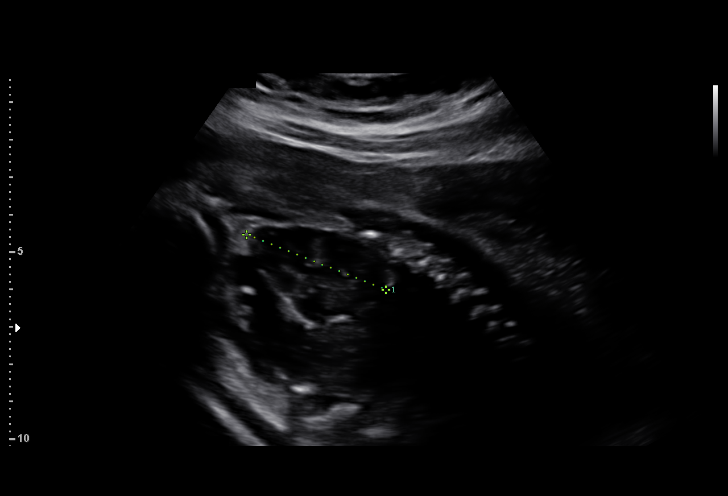
[im 21/43]
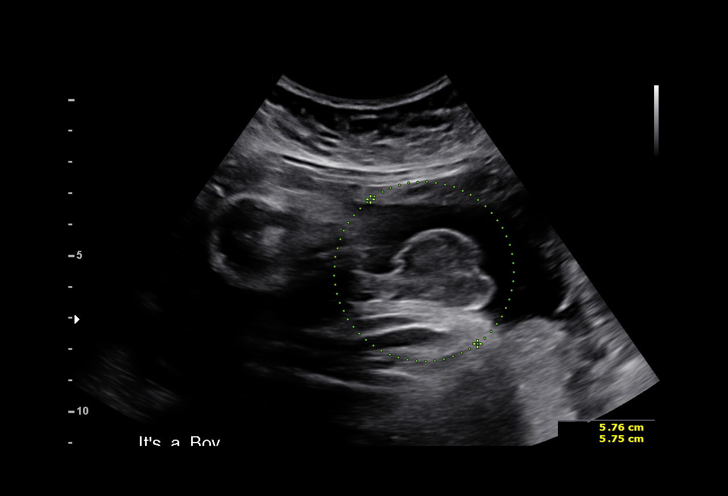
[im 24/43]
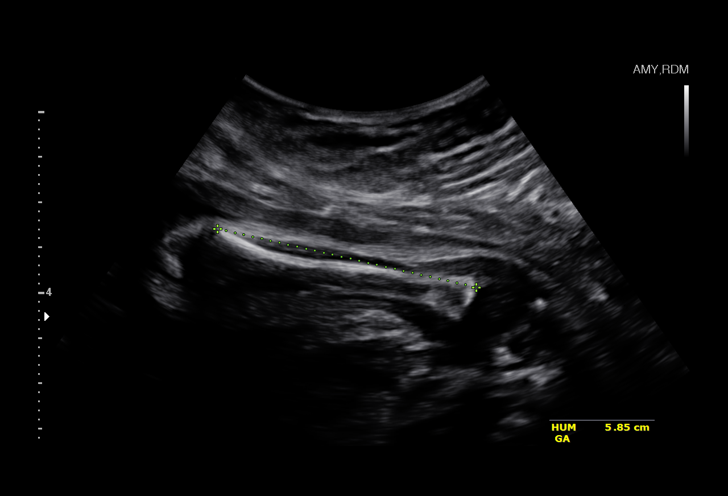
[im 27/43]
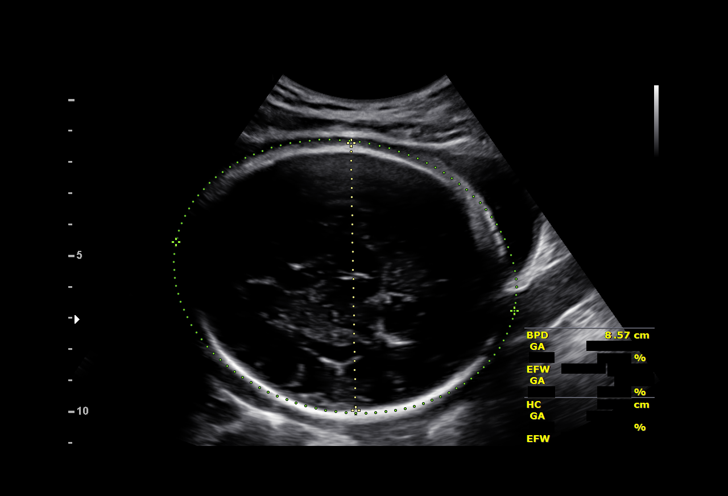
[im 30/43]
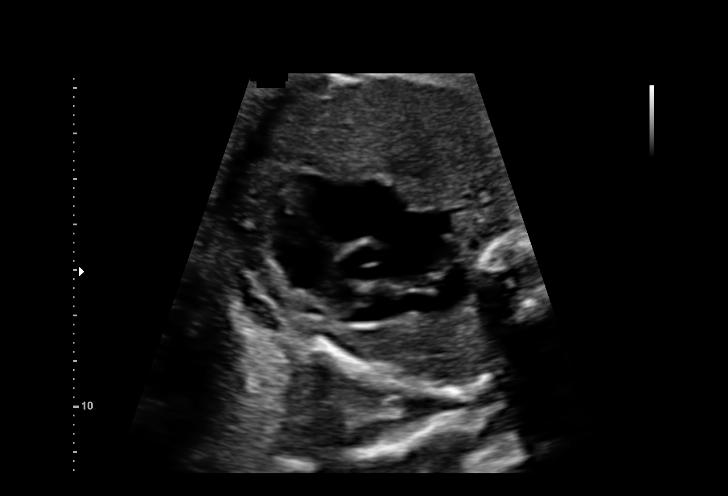
[im 33/43]
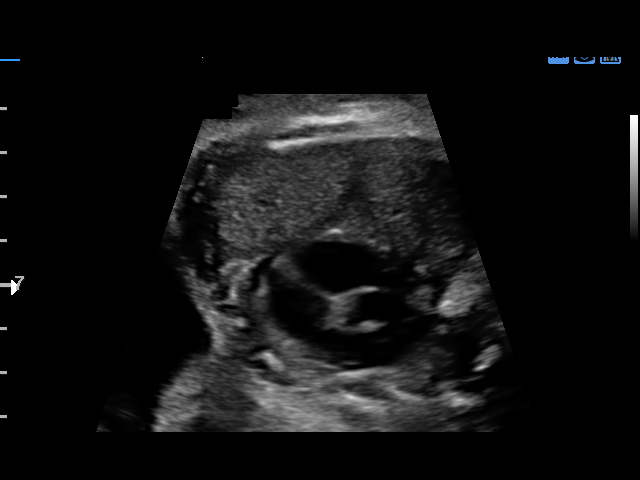
[im 36/43]
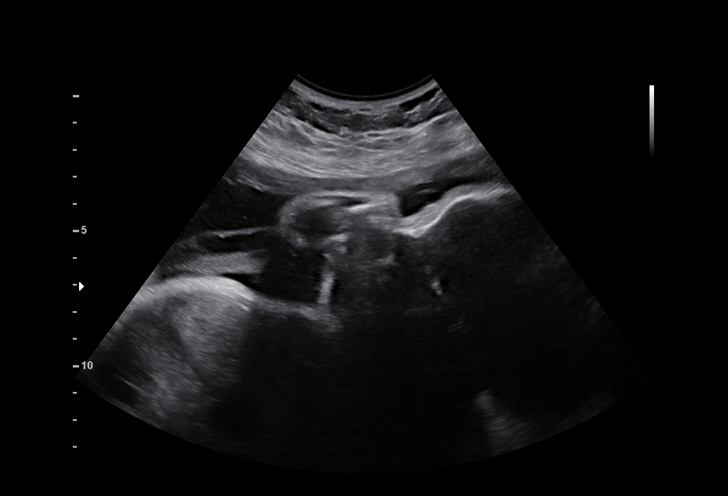
[im 39/43]
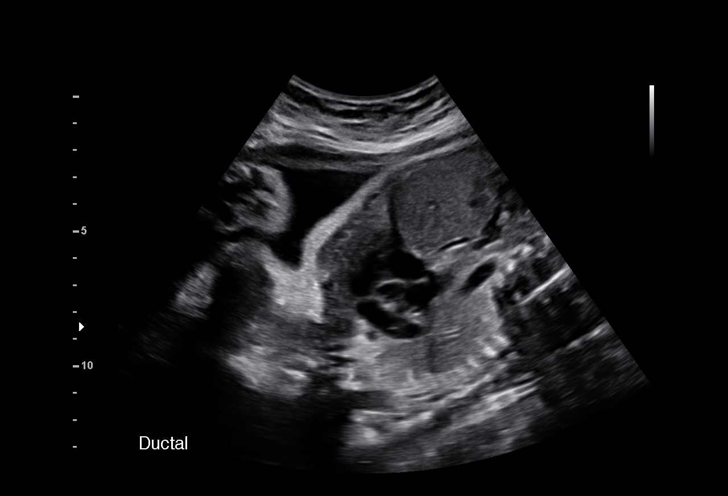
[im 43/43]
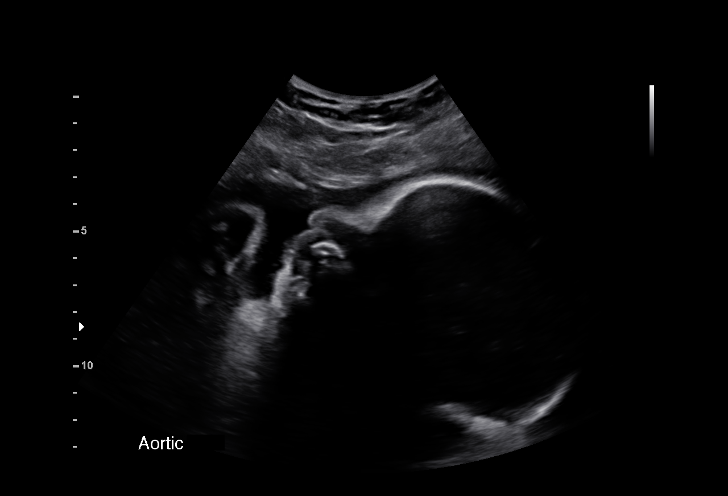

[14 of 28 positions shown; findings below may reference images not displayed]

OBSTETRICS REPORT
(Signed Final [DATE] [DATE])

Date:

Service(s) Provided

Indications

Obesity complicating pregnancy, third trimester        [28]
Previous cesarean delivery x2, antepartum              [28]
32 weeks gestation of pregnancy
Follow-up incomplete fetal anatomic evaluation         Z36
Fetal Evaluation

Num Of             1
Fetuses:
Fetal Heart        138                          bpm
Rate:
Cardiac Activity:  Observed
Presentation:      Cephalic
Placenta:          Posterior, above cervical
os
P. Cord            Previously Visualized
Insertion:

Amniotic Fluid
AFI FV:      Subjectively within normal limits
AFI Sum:     13.63    cm      45  %Tile     Larg Pckt:    4.62   cm
RUQ:   4.62    cm    RLQ:   2.04    cm   LUQ:    3.69    cm   LLQ:    3.28   cm
Biometry

BPD:     85.8   m    G. Age:   34w 4d                 CI:        74.06   70 - 86
m
FL/HC:      20.6   19.9 -
21.5
HC:     316.6   m    G. Age:   35w 4d        81  %    HC/AC:      1.13   0.96 -
m
AC:     280.2   m    G. Age:   32w 0d        28  %    FL/BPD      75.9   71 - 87
m                                     :
FL:      65.1   m    G. Age:   33w 4d        57  %    FL/AC:      23.2   20 - 24
m
HUM:     58.5   m    G. Age:   33w 6d        80  %
m
Est.        [28]   gm   4 lb 11 oz      62   %
FW:
Gestational Age

LMP:           32w 6d        Date:  [DATE]                  EDD:   [DATE]
U/S Today:     33w 6d                                         EDD:   [DATE]
Best:          32w 6d    Det. By:   LMP  ([DATE])           EDD:   [DATE]
Anatomy

Cranium:          Appears normal         Aortic Arch:       Appears normal
Fetal Cavum:      Appears normal         Ductal Arch:       Appears normal
Ventricles:       Appears normal         Diaphragm:         Appears normal
Choroid Plexus:   Previously seen        Stomach:           Appears normal,
left sided
Cerebellum:       Previously seen        Abdomen:           Previously seen
Posterior         Previously seen        Abdominal          Previously seen
Fossa:                                   Wall:
Nuchal Fold:      Previously seen        Cord Vessels:      Previously seen
Face:             Orbits and profile     Kidneys:           Appear normal
previously seen
Lips:             Previously seen        Bladder:           Appears normal
Heart:            Appears normal         Spine:             Previously seen
(4CH, axis, and
situs)
RVOT:             Appears normal         Lower              Previously seen
Extremities:
LVOT:             Appears normal         Upper              Previously seen
Extremities:

Other:   Male gender.
Cervix Uterus Adnexa

Cervix:       Not visualized (advanced GA >[28])
Impression

SIUP at 32+6 weeks
Normal interval anatomy; anatomic survey complete
Normal amniotic fluid volume
Appropriate interval growth with EFW at the 62nd %tile
Recommendations

Follow-up as clinically indicated

## 2015-07-09 NOTE — Progress Notes (Signed)
Subjective:    Marisa Yu is a 29 y.o. female being seen today for her obstetrical visit. She is at 8364w6d gestation. Patient reports no complaints. Fetal movement: normal.  Problem List Items Addressed This Visit    None     Patient Active Problem List   Diagnosis Date Noted  . Obesity complicating pregnancy in second trimester   . Encounter for fetal anatomic survey   . [redacted] weeks gestation of pregnancy   . Depression affecting pregnancy in first trimester, antepartum 02/08/2015  . Nausea and vomiting during pregnancy prior to [redacted] weeks gestation 02/08/2015  . Anemia affecting pregnancy 02/08/2015  . Supervision of normal pregnancy in first trimester 01/29/2015  . Carbuncle and furuncle of buttock 01/30/2013  . BV (bacterial vaginosis) 01/30/2013  . Excessive or frequent menstruation 01/30/2013   Objective:    BP 112/79 mmHg  Pulse 83  Temp(Src) 98.2 F (36.8 C)  Wt 248 lb (112.492 kg)  LMP 11/21/2014 FHT:  145 BPM  Uterine Size: size equals dates  Presentation: cephalic     Assessment:    Pregnancy @ 4664w6d weeks   Doing well Plan:     labs reviewed, problem list updated Consent signed. GBS planning TDAP offered  Rhogam given for RH negative Pediatrician: discussed. Infant feeding: plans to breastfeed. Maternity leave: discussed. Cigarette smoking: never smoked. No orders of the defined types were placed in this encounter.   No orders of the defined types were placed in this encounter.   Follow up in 2 Weeks.

## 2015-07-10 ENCOUNTER — Other Ambulatory Visit: Payer: Self-pay | Admitting: Certified Nurse Midwife

## 2015-07-23 ENCOUNTER — Ambulatory Visit (INDEPENDENT_AMBULATORY_CARE_PROVIDER_SITE_OTHER): Payer: 59 | Admitting: Certified Nurse Midwife

## 2015-07-23 VITALS — BP 108/72 | HR 86 | Temp 97.8°F | Wt 246.0 lb

## 2015-07-23 DIAGNOSIS — Z3483 Encounter for supervision of other normal pregnancy, third trimester: Secondary | ICD-10-CM

## 2015-07-23 DIAGNOSIS — K640 First degree hemorrhoids: Secondary | ICD-10-CM

## 2015-07-23 DIAGNOSIS — Z1389 Encounter for screening for other disorder: Secondary | ICD-10-CM

## 2015-07-23 DIAGNOSIS — B373 Candidiasis of vulva and vagina: Secondary | ICD-10-CM

## 2015-07-23 DIAGNOSIS — Z331 Pregnant state, incidental: Secondary | ICD-10-CM

## 2015-07-23 DIAGNOSIS — B3731 Acute candidiasis of vulva and vagina: Secondary | ICD-10-CM

## 2015-07-23 MED ORDER — HYDROCORTISONE ACETATE 25 MG RE SUPP: 25.0000 mg | Freq: Two times a day (BID) | RECTAL | Status: DC

## 2015-07-23 MED ORDER — TERCONAZOLE 0.4 % VA CREA: 1.0000 | TOPICAL_CREAM | Freq: Every day | VAGINAL | Status: DC

## 2015-07-23 MED ORDER — FLUCONAZOLE 100 MG PO TABS: 100.0000 mg | ORAL_TABLET | Freq: Once | ORAL | Status: DC

## 2015-07-23 NOTE — Progress Notes (Signed)
Subjective:    Marisa Yu is a 29 y.o. female being seen today for her obstetrical visit. She is at 7147w6d gestation. Patient reports vaginal irritation and hemorrhoids. Fetal movement: normal.  Problem List Items Addressed This Visit    None    Visit Diagnoses    Supervision of other normal pregnancy, antepartum, third trimester    -  Primary    Relevant Medications    fluconazole (DIFLUCAN) 100 MG tablet    terconazole (TERAZOL 7) 0.4 % vaginal cream    Other Relevant Orders    POCT urinalysis dipstick    Strep B DNA probe    SureSwab, Vaginosis/Vaginitis Plus    Yeast vaginitis        Relevant Medications    fluconazole (DIFLUCAN) 100 MG tablet    terconazole (TERAZOL 7) 0.4 % vaginal cream    Other Relevant Orders    SureSwab, Vaginosis/Vaginitis Plus    First degree hemorrhoids        Relevant Medications    hydrocortisone (ANUSOL-HC) 25 MG suppository      Patient Active Problem List   Diagnosis Date Noted  . Obesity complicating pregnancy in second trimester   . Encounter for fetal anatomic survey   . [redacted] weeks gestation of pregnancy   . Depression affecting pregnancy in first trimester, antepartum 02/08/2015  . Nausea and vomiting during pregnancy prior to [redacted] weeks gestation 02/08/2015  . Anemia affecting pregnancy 02/08/2015  . Supervision of normal pregnancy in first trimester 01/29/2015  . Carbuncle and furuncle of buttock 01/30/2013  . BV (bacterial vaginosis) 01/30/2013  . Excessive or frequent menstruation 01/30/2013   Objective:    BP 108/72 mmHg  Pulse 86  Temp(Src) 97.8 F (36.6 C)  Wt 246 lb (111.585 kg)  LMP 11/21/2014 FHT:  150 BPM  Uterine Size: size equals dates  Presentation: cephalic     Assessment:    Pregnancy @ 1247w6d weeks   Hemorrhoids  VVC Plan:   C-section & BTL scheduled for the end of November.    labs reviewed, problem list updated Consent signed. GBS sent TDAP offered  Rhogam given for RH negative Pediatrician:  discussed. Infant feeding: plans to breastfeed. Maternity leave: discussed. Cigarette smoking: never smoked. Orders Placed This Encounter  Procedures  . Strep B DNA probe  . SureSwab, Vaginosis/Vaginitis Plus  . POCT urinalysis dipstick   Meds ordered this encounter  Medications  . hydrocortisone (ANUSOL-HC) 25 MG suppository    Sig: Place 1 suppository (25 mg total) rectally 2 (two) times daily.    Dispense:  12 suppository    Refill:  0  . fluconazole (DIFLUCAN) 100 MG tablet    Sig: Take 1 tablet (100 mg total) by mouth once. Repeat dose in 48-72 hour.    Dispense:  3 tablet    Refill:  0  . terconazole (TERAZOL 7) 0.4 % vaginal cream    Sig: Place 1 applicator vaginally at bedtime.    Dispense:  45 g    Refill:  0   Follow up in 1 Week.

## 2015-07-24 LAB — STREP B DNA PROBE: GBSP: NOT DETECTED

## 2015-07-25 ENCOUNTER — Other Ambulatory Visit: Payer: Self-pay | Admitting: *Deleted

## 2015-07-29 LAB — SURESWAB, VAGINOSIS/VAGINITIS PLUS
ATOPOBIUM VAGINAE: NOT DETECTED Log (cells/mL)
C. ALBICANS, DNA: DETECTED — AB
C. PARAPSILOSIS, DNA: NOT DETECTED
C. TRACHOMATIS RNA, TMA: NOT DETECTED
C. TROPICALIS, DNA: NOT DETECTED
C. glabrata, DNA: NOT DETECTED
GARDNERELLA VAGINALIS: NOT DETECTED Log (cells/mL)
LACTOBACILLUS SPECIES: DETECTED Log (cells/mL)
MEGASPHAERA SPECIES: NOT DETECTED Log (cells/mL)
N. gonorrhoeae RNA, TMA: NOT DETECTED
T. VAGINALIS RNA, QL TMA: NOT DETECTED

## 2015-07-30 ENCOUNTER — Encounter: Payer: Self-pay | Admitting: Obstetrics

## 2015-07-30 ENCOUNTER — Ambulatory Visit (INDEPENDENT_AMBULATORY_CARE_PROVIDER_SITE_OTHER): Payer: Medicaid Other | Admitting: Obstetrics

## 2015-07-30 VITALS — BP 119/80 | HR 90 | Temp 97.7°F | Wt 249.0 lb

## 2015-07-30 DIAGNOSIS — Z3483 Encounter for supervision of other normal pregnancy, third trimester: Secondary | ICD-10-CM

## 2015-07-30 LAB — POCT URINALYSIS DIPSTICK
Bilirubin, UA: NEGATIVE
GLUCOSE UA: NEGATIVE
Ketones, UA: NEGATIVE
Leukocytes, UA: NEGATIVE
NITRITE UA: NEGATIVE
PH UA: 7
PROTEIN UA: 1
RBC UA: NEGATIVE
SPEC GRAV UA: 1.015
UROBILINOGEN UA: NEGATIVE

## 2015-07-30 NOTE — Progress Notes (Signed)
Subjective:    Marisa Yu is a 29 y.o. female being seen today for her obstetrical visit. She is at 4225w6d gestation. Patient reports no complaints. Fetal movement: normal.  Problem List Items Addressed This Visit    None     Patient Active Problem List   Diagnosis Date Noted  . Obesity complicating pregnancy in second trimester   . Encounter for fetal anatomic survey   . [redacted] weeks gestation of pregnancy   . Depression affecting pregnancy in first trimester, antepartum 02/08/2015  . Nausea and vomiting during pregnancy prior to [redacted] weeks gestation 02/08/2015  . Anemia affecting pregnancy 02/08/2015  . Supervision of normal pregnancy in first trimester 01/29/2015  . Carbuncle and furuncle of buttock 01/30/2013  . BV (bacterial vaginosis) 01/30/2013  . Excessive or frequent menstruation 01/30/2013   Objective:    BP 119/80 mmHg  Pulse 90  Temp(Src) 97.7 F (36.5 C)  Wt 249 lb (112.946 kg)  LMP 11/21/2014 FHT:  150 BPM  Uterine Size: size equals dates  Presentation: unsure     Assessment:    Pregnancy @ 6625w6d weeks   Plan:     labs reviewed, problem list updated Consent signed. GBS sent TDAP offered  Rhogam given for RH negative Pediatrician: discussed. Infant feeding: plans to breastfeed. Maternity leave: discussed. Cigarette smoking: former smoker. No orders of the defined types were placed in this encounter.   No orders of the defined types were placed in this encounter.   Follow up in 1 Week.

## 2015-08-07 ENCOUNTER — Ambulatory Visit (INDEPENDENT_AMBULATORY_CARE_PROVIDER_SITE_OTHER): Payer: Medicaid Other | Admitting: Certified Nurse Midwife

## 2015-08-07 VITALS — BP 120/79 | HR 87 | Temp 98.3°F | Wt 250.0 lb

## 2015-08-07 DIAGNOSIS — Z3483 Encounter for supervision of other normal pregnancy, third trimester: Secondary | ICD-10-CM

## 2015-08-07 NOTE — Progress Notes (Signed)
Subjective:    Marisa Yu is a 29 y.o. female being seen today for her obstetrical visit. She is at 6374w0d gestation. Patient reports no complaints. Fetal movement: normal.  Problem List Items Addressed This Visit    None    Visit Diagnoses    Encounter for supervision of other normal pregnancy in third trimester    -  Primary    Relevant Orders    POCT urinalysis dipstick      Patient Active Problem List   Diagnosis Date Noted  . Obesity complicating pregnancy in second trimester   . Encounter for fetal anatomic survey   . [redacted] weeks gestation of pregnancy   . Depression affecting pregnancy in first trimester, antepartum 02/08/2015  . Nausea and vomiting during pregnancy prior to [redacted] weeks gestation 02/08/2015  . Anemia affecting pregnancy 02/08/2015  . Supervision of normal pregnancy in first trimester 01/29/2015  . Carbuncle and furuncle of buttock 01/30/2013  . BV (bacterial vaginosis) 01/30/2013  . Excessive or frequent menstruation 01/30/2013    Objective:    BP 120/79 mmHg  Pulse 87  Temp(Src) 98.3 F (36.8 C)  Wt 250 lb (113.399 kg)  LMP 11/21/2014 FHT: 125 BPM  Uterine Size: size equals dates  Presentations: cephalic  Pelvic Exam: deferred    Assessment:    Pregnancy @ 8074w0d weeks   Doing well  Repeat C-section with BTL  Plan:   Plans for delivery: C/Section scheduled; labs reviewed; problem list updated Counseling: Consent signed. Infant feeding: plans to breastfeed. Cigarette smoking: never smoked. L&D discussion: symptoms of labor, discussed when to call, discussed what number to call, anesthetic/analgesic options reviewed and delivering clinician:  plans Physician. Postpartum supports and preparation: circumcision discussed and contraception plans discussed.  Follow up in 1 Week.

## 2015-08-14 ENCOUNTER — Ambulatory Visit (INDEPENDENT_AMBULATORY_CARE_PROVIDER_SITE_OTHER): Payer: Medicaid Other | Admitting: Certified Nurse Midwife

## 2015-08-14 VITALS — BP 114/80 | HR 93 | Temp 97.3°F | Wt 254.0 lb

## 2015-08-14 DIAGNOSIS — Z3483 Encounter for supervision of other normal pregnancy, third trimester: Secondary | ICD-10-CM

## 2015-08-14 NOTE — Progress Notes (Signed)
Subjective:    Joellyn Quailsshley M Legrand is a 29 y.o. female being seen today for her obstetrical visit. She is at 8160w0d gestation. Patient reports no complaints. Fetal movement: normal.  Problem List Items Addressed This Visit    None    Visit Diagnoses    Supervision of other normal pregnancy, antepartum, third trimester    -  Primary    Relevant Orders    POCT urinalysis dipstick      Patient Active Problem List   Diagnosis Date Noted  . Obesity complicating pregnancy in second trimester   . Encounter for fetal anatomic survey   . [redacted] weeks gestation of pregnancy   . Depression affecting pregnancy in first trimester, antepartum 02/08/2015  . Nausea and vomiting during pregnancy prior to [redacted] weeks gestation 02/08/2015  . Anemia affecting pregnancy 02/08/2015  . Supervision of normal pregnancy in first trimester 01/29/2015  . Carbuncle and furuncle of buttock 01/30/2013  . BV (bacterial vaginosis) 01/30/2013  . Excessive or frequent menstruation 01/30/2013    Objective:    BP 114/80 mmHg  Pulse 93  Temp(Src) 97.3 F (36.3 C)  Wt 254 lb (115.214 kg)  LMP 11/21/2014 FHT: 140 BPM  Uterine Size: size equals dates  Presentations: cephalic  Pelvic Exam: deferred     Assessment:    Pregnancy @ 3460w0d weeks   Repeat C-section, scheduled Plan:   Plans for delivery: C/Section scheduled for 11/30 at 0730; labs reviewed; problem list updated Counseling: Consent signed. Infant feeding: plans to breastfeed. Cigarette smoking: never smoked. L&D discussion: symptoms of labor, discussed when to call, discussed what number to call, anesthetic/analgesic options reviewed and delivering clinician:  plans Physician. Postpartum supports and preparation: circumcision discussed and contraception BTL plans discussed.  Follow up postpartum.

## 2015-08-19 ENCOUNTER — Encounter (HOSPITAL_COMMUNITY)
Admission: RE | Admit: 2015-08-19 | Discharge: 2015-08-19 | Disposition: A | Discharge status: Home / Self Care | Payer: Medicaid Other | Source: Ambulatory Visit | Attending: Obstetrics | Admitting: Obstetrics

## 2015-08-19 ENCOUNTER — Encounter (HOSPITAL_COMMUNITY): Payer: Self-pay

## 2015-08-19 ENCOUNTER — Ambulatory Visit (INDEPENDENT_AMBULATORY_CARE_PROVIDER_SITE_OTHER): Payer: Medicaid Other | Admitting: Certified Nurse Midwife

## 2015-08-19 VITALS — BP 112/75 | HR 84 | Temp 98.0°F | Wt 249.0 lb

## 2015-08-19 DIAGNOSIS — Z01818 Encounter for other preprocedural examination: Secondary | ICD-10-CM | POA: Diagnosis not present

## 2015-08-19 DIAGNOSIS — Z3483 Encounter for supervision of other normal pregnancy, third trimester: Secondary | ICD-10-CM

## 2015-08-19 DIAGNOSIS — L729 Follicular cyst of the skin and subcutaneous tissue, unspecified: Secondary | ICD-10-CM

## 2015-08-19 LAB — CBC
HEMATOCRIT: 30.3 % — AB (ref 36.0–46.0)
HEMOGLOBIN: 9.2 g/dL — AB (ref 12.0–15.0)
MCH: 22.7 pg — ABNORMAL LOW (ref 26.0–34.0)
MCHC: 30.4 g/dL (ref 30.0–36.0)
MCV: 74.6 fL — AB (ref 78.0–100.0)
Platelets: 368 10*3/uL (ref 150–400)
RBC: 4.06 MIL/uL (ref 3.87–5.11)
RDW: 16.7 % — ABNORMAL HIGH (ref 11.5–15.5)
WBC: 8.1 10*3/uL (ref 4.0–10.5)

## 2015-08-19 LAB — ABO/RH: ABO/RH(D): B POS

## 2015-08-19 MED ORDER — AMOXICILLIN-POT CLAVULANATE 875-125 MG PO TABS: 1.0000 | ORAL_TABLET | Freq: Two times a day (BID) | ORAL | Status: DC

## 2015-08-19 NOTE — Progress Notes (Signed)
Subjective:    Marisa Yu is a 29 y.o. female being seen today for her obstetrical visit. She is at 4370w5d gestation. Patient reports no bleeding, no cramping, no leaking, occasional contractions and reports a buttock cyst that ruptured over the weekend, denies any fever, tender to touch.. Fetal movement: normal.  Problem List Items Addressed This Visit    None    Visit Diagnoses    Supervision of other normal pregnancy, antepartum, third trimester    -  Primary    Relevant Orders    POCT urinalysis dipstick    Cyst of buttocks        Relevant Medications    amoxicillin-clavulanate (AUGMENTIN) 875-125 MG tablet    Other Relevant Orders    Wound culture      Patient Active Problem List   Diagnosis Date Noted  . Obesity complicating pregnancy in second trimester   . Encounter for fetal anatomic survey   . [redacted] weeks gestation of pregnancy   . Depression affecting pregnancy in first trimester, antepartum 02/08/2015  . Nausea and vomiting during pregnancy prior to [redacted] weeks gestation 02/08/2015  . Anemia affecting pregnancy 02/08/2015  . Supervision of normal pregnancy in first trimester 01/29/2015  . Carbuncle and furuncle of buttock 01/30/2013  . BV (bacterial vaginosis) 01/30/2013  . Excessive or frequent menstruation 01/30/2013    Objective:    BP 112/75 mmHg  Pulse 84  Temp(Src) 98 F (36.7 C)  Wt 249 lb (112.946 kg)  LMP 11/21/2014 FHT: 135 BPM  Uterine Size: size equals dates  Presentations: cephalic  Pelvic Exam: deferred    Right buttock, small ruptured cyst, some fatty tissue in center, small area of cental erythema induration about 1 cm round in size.    Assessment:    Pregnancy @ 7170w5d weeks   Cyst of Right Buttock  Plan:   Plans for delivery: C/Section scheduled; labs reviewed; problem list updated Counseling: Consent signed. Infant feeding: plans to breastfeed. Cigarette smoking: never smoked. L&D discussion: symptoms of labor, discussed when to  call, discussed what number to call, anesthetic/analgesic options reviewed and delivering clinician:  plans Physician. Postpartum supports and preparation: circumcision discussed and contraception plans discussed.  Follow up in 2 Weeks for Postpartum exam.

## 2015-08-19 NOTE — Patient Instructions (Signed)
Your procedure is scheduled on:  August 21, 2015   [facility]: 6:00 am   Pick up the phone at the desk and dial [phone].  Call this number if you have problems the morning of surgery: [phone].  Remember: Do NOT eat food: after midnight on Tuesday  Do NOT drink clear liquids after: midnight on Tuesday  Take these medicines the morning of surgery with a SIP OF WATER:  None   Do NOT wear jewelry (body piercing), metal hair clips/bobby pins, or nail polish. Do NOT wear lotions, powders, or perfumes.  You may wear deoderant. Do NOT shave for 48 hours prior to surgery. Do NOT bring valuables to the hospital. Leave suitcase in car.  After surgery it may be brought to your room.  For patients admitted to the hospital, checkout time is 11:00 AM the day of discharge.

## 2015-08-20 ENCOUNTER — Other Ambulatory Visit: Payer: Self-pay | Admitting: Certified Nurse Midwife

## 2015-08-20 LAB — RPR: RPR Ser Ql: NONREACTIVE

## 2015-08-20 NOTE — Anesthesia Preprocedure Evaluation (Addendum)
Anesthesia Evaluation  Patient identified by MRN, date of birth, ID band Patient awake    Reviewed: Allergy & Precautions, NPO status , Patient's Chart, lab work & pertinent test results  History of Anesthesia Complications Negative for: history of anesthetic complications  Airway Mallampati: II  TM Distance: >3 FB Neck ROM: Full    Dental no notable dental hx. (+) Dental Advisory Given   Pulmonary former smoker,    Pulmonary exam normal breath sounds clear to auscultation       Cardiovascular hypertension, Normal cardiovascular exam Rhythm:Regular Rate:Normal     Neuro/Psych  Headaches, PSYCHIATRIC DISORDERS Anxiety Depression negative psych ROS   GI/Hepatic negative GI ROS, Neg liver ROS,   Endo/Other  Hypothyroidism Morbid obesity  Renal/GU negative Renal ROS  negative genitourinary   Musculoskeletal negative musculoskeletal ROS (+)   Abdominal   Peds negative pediatric ROS (+)  Hematology negative hematology ROS (+)   Anesthesia Other Findings   Reproductive/Obstetrics (+) Pregnancy                           Anesthesia Physical Anesthesia Plan  ASA: III  Anesthesia Plan: Spinal   Post-op Pain Management:    Induction:   Airway Management Planned:   Additional Equipment:   Intra-op Plan:   Post-operative Plan:   Informed Consent: I have reviewed the patients History and Physical, chart, labs and discussed the procedure including the risks, benefits and alternatives for the proposed anesthesia with the patient or authorized representative who has indicated his/her understanding and acceptance.   Dental advisory given  Plan Discussed with: CRNA  Anesthesia Plan Comments:         Anesthesia Quick Evaluation

## 2015-08-21 ENCOUNTER — Encounter (HOSPITAL_COMMUNITY): Payer: Self-pay | Admitting: Anesthesiology

## 2015-08-21 ENCOUNTER — Inpatient Hospital Stay (HOSPITAL_COMMUNITY): Payer: Medicaid Other | Admitting: Anesthesiology

## 2015-08-21 ENCOUNTER — Encounter (HOSPITAL_COMMUNITY)
Admission: RE | Disposition: A | Discharge status: Home / Self Care | Payer: Self-pay | Source: Ambulatory Visit | Attending: Obstetrics

## 2015-08-21 ENCOUNTER — Other Ambulatory Visit: Payer: Self-pay | Admitting: Obstetrics

## 2015-08-21 ENCOUNTER — Inpatient Hospital Stay (HOSPITAL_COMMUNITY)
Admission: RE | Admit: 2015-08-21 | Discharge: 2015-08-24 | DRG: 765 | Disposition: A | Discharge status: Home / Self Care | Payer: Medicaid Other | Source: Ambulatory Visit | Attending: Obstetrics | Admitting: Obstetrics

## 2015-08-21 DIAGNOSIS — Z6841 Body Mass Index (BMI) 40.0 and over, adult: Secondary | ICD-10-CM

## 2015-08-21 DIAGNOSIS — O164 Unspecified maternal hypertension, complicating childbirth: Secondary | ICD-10-CM | POA: Diagnosis present

## 2015-08-21 DIAGNOSIS — O99214 Obesity complicating childbirth: Secondary | ICD-10-CM | POA: Diagnosis present

## 2015-08-21 DIAGNOSIS — Z809 Family history of malignant neoplasm, unspecified: Secondary | ICD-10-CM

## 2015-08-21 DIAGNOSIS — O34211 Maternal care for low transverse scar from previous cesarean delivery: Secondary | ICD-10-CM | POA: Diagnosis present

## 2015-08-21 DIAGNOSIS — O99344 Other mental disorders complicating childbirth: Secondary | ICD-10-CM | POA: Diagnosis present

## 2015-08-21 DIAGNOSIS — O99284 Endocrine, nutritional and metabolic diseases complicating childbirth: Secondary | ICD-10-CM | POA: Diagnosis present

## 2015-08-21 DIAGNOSIS — O9902 Anemia complicating childbirth: Secondary | ICD-10-CM | POA: Diagnosis present

## 2015-08-21 DIAGNOSIS — F419 Anxiety disorder, unspecified: Secondary | ICD-10-CM | POA: Diagnosis present

## 2015-08-21 DIAGNOSIS — F329 Major depressive disorder, single episode, unspecified: Secondary | ICD-10-CM | POA: Diagnosis present

## 2015-08-21 DIAGNOSIS — Z98891 History of uterine scar from previous surgery: Secondary | ICD-10-CM

## 2015-08-21 DIAGNOSIS — Z8249 Family history of ischemic heart disease and other diseases of the circulatory system: Secondary | ICD-10-CM | POA: Diagnosis not present

## 2015-08-21 DIAGNOSIS — E039 Hypothyroidism, unspecified: Secondary | ICD-10-CM | POA: Diagnosis present

## 2015-08-21 DIAGNOSIS — Z302 Encounter for sterilization: Secondary | ICD-10-CM

## 2015-08-21 DIAGNOSIS — Z3A39 39 weeks gestation of pregnancy: Secondary | ICD-10-CM | POA: Diagnosis not present

## 2015-08-21 DIAGNOSIS — Z87891 Personal history of nicotine dependence: Secondary | ICD-10-CM

## 2015-08-21 LAB — PREPARE RBC (CROSSMATCH)

## 2015-08-21 SURGERY — Surgical Case
Anesthesia: Spinal | Laterality: Bilateral

## 2015-08-21 MED ORDER — DIPHENHYDRAMINE HCL 50 MG/ML IJ SOLN
12.5000 mg | Freq: Once | INTRAMUSCULAR | Status: AC
  Administered 2015-08-21: 12.5 mg via INTRAVENOUS

## 2015-08-21 MED ORDER — DIPHENHYDRAMINE HCL 50 MG/ML IJ SOLN
INTRAMUSCULAR | Status: AC
  Filled 2015-08-21: qty 1

## 2015-08-21 MED ORDER — FENTANYL CITRATE (PF) 100 MCG/2ML IJ SOLN: 25.0000 ug | INTRAMUSCULAR | Status: DC | PRN

## 2015-08-21 MED ORDER — OXYTOCIN 10 UNIT/ML IJ SOLN
40.0000 [IU] | INTRAVENOUS | Status: DC | PRN
  Administered 2015-08-21: 40 [IU] via INTRAVENOUS

## 2015-08-21 MED ORDER — CEFAZOLIN SODIUM-DEXTROSE 2-3 GM-% IV SOLR
2.0000 g | Freq: Once | INTRAVENOUS | Status: AC
  Administered 2015-08-21: 2 g via INTRAVENOUS
  Filled 2015-08-21: qty 50

## 2015-08-21 MED ORDER — LACTATED RINGERS IV SOLN
INTRAVENOUS | Status: DC
  Administered 2015-08-21 (×2): via INTRAVENOUS

## 2015-08-21 MED ORDER — KETOROLAC TROMETHAMINE 30 MG/ML IJ SOLN
INTRAMUSCULAR | Status: AC
  Filled 2015-08-21: qty 1

## 2015-08-21 MED ORDER — ACETAMINOPHEN 500 MG PO TABS: 1000.0000 mg | ORAL_TABLET | Freq: Four times a day (QID) | ORAL | Status: DC

## 2015-08-21 MED ORDER — MORPHINE SULFATE (PF) 0.5 MG/ML IJ SOLN
INTRAMUSCULAR | Status: DC | PRN
  Administered 2015-08-21: .2 mg via INTRATHECAL

## 2015-08-21 MED ORDER — LACTATED RINGERS IV SOLN
INTRAVENOUS | Status: DC
  Administered 2015-08-21 – 2015-08-22 (×2): via INTRAVENOUS

## 2015-08-21 MED ORDER — SCOPOLAMINE 1 MG/3DAYS TD PT72
MEDICATED_PATCH | TRANSDERMAL | Status: AC
  Administered 2015-08-21: 1.5 mg via TRANSDERMAL
  Filled 2015-08-21: qty 1

## 2015-08-21 MED ORDER — MENTHOL 3 MG MT LOZG: 1.0000 | LOZENGE | OROMUCOSAL | Status: DC | PRN

## 2015-08-21 MED ORDER — SODIUM CHLORIDE 0.9 % IJ SOLN: 3.0000 mL | INTRAMUSCULAR | Status: DC | PRN

## 2015-08-21 MED ORDER — ONDANSETRON HCL 4 MG/2ML IJ SOLN
INTRAMUSCULAR | Status: DC | PRN
  Administered 2015-08-21: 4 mg via INTRAVENOUS

## 2015-08-21 MED ORDER — PHENYLEPHRINE 8 MG IN D5W 100 ML (0.08MG/ML) PREMIX OPTIME
INJECTION | INTRAVENOUS | Status: DC | PRN
  Administered 2015-08-21: 60 ug/min via INTRAVENOUS

## 2015-08-21 MED ORDER — ZOLPIDEM TARTRATE 5 MG PO TABS: 5.0000 mg | ORAL_TABLET | Freq: Every evening | ORAL | Status: DC | PRN

## 2015-08-21 MED ORDER — FENTANYL CITRATE (PF) 100 MCG/2ML IJ SOLN
INTRAMUSCULAR | Status: AC
  Filled 2015-08-21: qty 2

## 2015-08-21 MED ORDER — ONDANSETRON HCL 4 MG/2ML IJ SOLN: 4.0000 mg | Freq: Once | INTRAMUSCULAR | Status: DC | PRN

## 2015-08-21 MED ORDER — DIPHENHYDRAMINE HCL 25 MG PO CAPS
25.0000 mg | ORAL_CAPSULE | ORAL | Status: DC | PRN
  Filled 2015-08-21: qty 1

## 2015-08-21 MED ORDER — IBUPROFEN 600 MG PO TABS
600.0000 mg | ORAL_TABLET | Freq: Four times a day (QID) | ORAL | Status: DC
  Administered 2015-08-21 – 2015-08-24 (×11): 600 mg via ORAL
  Filled 2015-08-21 (×11): qty 1

## 2015-08-21 MED ORDER — DIPHENHYDRAMINE HCL 25 MG PO CAPS: 25.0000 mg | ORAL_CAPSULE | Freq: Four times a day (QID) | ORAL | Status: DC | PRN

## 2015-08-21 MED ORDER — WITCH HAZEL-GLYCERIN EX PADS: 1.0000 "application " | MEDICATED_PAD | CUTANEOUS | Status: DC | PRN

## 2015-08-21 MED ORDER — NALOXONE HCL 0.4 MG/ML IJ SOLN: 0.4000 mg | INTRAMUSCULAR | Status: DC | PRN

## 2015-08-21 MED ORDER — PHENYLEPHRINE 8 MG IN D5W 100 ML (0.08MG/ML) PREMIX OPTIME
INJECTION | INTRAVENOUS | Status: AC
  Filled 2015-08-21: qty 100

## 2015-08-21 MED ORDER — ACETAMINOPHEN 325 MG PO TABS: 650.0000 mg | ORAL_TABLET | ORAL | Status: DC | PRN

## 2015-08-21 MED ORDER — PRENATAL MULTIVITAMIN CH
1.0000 | ORAL_TABLET | Freq: Every day | ORAL | Status: DC
  Administered 2015-08-22 – 2015-08-23 (×2): 1 via ORAL
  Filled 2015-08-21 (×2): qty 1

## 2015-08-21 MED ORDER — ONDANSETRON HCL 4 MG/2ML IJ SOLN
INTRAMUSCULAR | Status: AC
  Filled 2015-08-21: qty 2

## 2015-08-21 MED ORDER — CEFAZOLIN SODIUM-DEXTROSE 2-3 GM-% IV SOLR
INTRAVENOUS | Status: AC
  Filled 2015-08-21: qty 50

## 2015-08-21 MED ORDER — KETOROLAC TROMETHAMINE 30 MG/ML IJ SOLN
30.0000 mg | Freq: Four times a day (QID) | INTRAMUSCULAR | Status: DC | PRN
  Administered 2015-08-21: 30 mg via INTRAMUSCULAR

## 2015-08-21 MED ORDER — SIMETHICONE 80 MG PO CHEW
80.0000 mg | CHEWABLE_TABLET | Freq: Three times a day (TID) | ORAL | Status: DC
  Administered 2015-08-21 – 2015-08-24 (×9): 80 mg via ORAL
  Filled 2015-08-21 (×8): qty 1

## 2015-08-21 MED ORDER — LACTATED RINGERS IV SOLN
INTRAVENOUS | Status: DC | PRN
  Administered 2015-08-21: 08:00:00 via INTRAVENOUS

## 2015-08-21 MED ORDER — SODIUM CHLORIDE 0.9 % IV SOLN
3.0000 g | Freq: Four times a day (QID) | INTRAVENOUS | Status: DC
  Administered 2015-08-21 – 2015-08-22 (×4): 3 g via INTRAVENOUS
  Filled 2015-08-21 (×5): qty 3

## 2015-08-21 MED ORDER — KETOROLAC TROMETHAMINE 30 MG/ML IJ SOLN: 30.0000 mg | Freq: Four times a day (QID) | INTRAMUSCULAR | Status: DC | PRN

## 2015-08-21 MED ORDER — ERYTHROMYCIN 5 MG/GM OP OINT
TOPICAL_OINTMENT | OPHTHALMIC | Status: AC
  Filled 2015-08-21: qty 1

## 2015-08-21 MED ORDER — SENNOSIDES-DOCUSATE SODIUM 8.6-50 MG PO TABS
2.0000 | ORAL_TABLET | ORAL | Status: DC
  Administered 2015-08-22 – 2015-08-23 (×3): 2 via ORAL
  Filled 2015-08-21 (×3): qty 2

## 2015-08-21 MED ORDER — DIPHENHYDRAMINE HCL 50 MG/ML IJ SOLN
12.5000 mg | INTRAMUSCULAR | Status: DC | PRN
  Administered 2015-08-21: 12.5 mg via INTRAVENOUS

## 2015-08-21 MED ORDER — MAGNESIUM HYDROXIDE 400 MG/5ML PO SUSP: 30.0000 mL | ORAL | Status: DC | PRN

## 2015-08-21 MED ORDER — ONDANSETRON HCL 4 MG/2ML IJ SOLN: 4.0000 mg | Freq: Three times a day (TID) | INTRAMUSCULAR | Status: DC | PRN

## 2015-08-21 MED ORDER — LACTATED RINGERS IV SOLN
Freq: Once | INTRAVENOUS | Status: AC
  Administered 2015-08-21: 07:00:00 via INTRAVENOUS

## 2015-08-21 MED ORDER — SCOPOLAMINE 1 MG/3DAYS TD PT72
1.0000 | MEDICATED_PATCH | Freq: Once | TRANSDERMAL | Status: AC
  Administered 2015-08-21: 1.5 mg via TRANSDERMAL

## 2015-08-21 MED ORDER — SIMETHICONE 80 MG PO CHEW
80.0000 mg | CHEWABLE_TABLET | ORAL | Status: DC
  Administered 2015-08-22 – 2015-08-23 (×2): 80 mg via ORAL
  Filled 2015-08-21 (×3): qty 1

## 2015-08-21 MED ORDER — LANOLIN HYDROUS EX OINT
1.0000 | TOPICAL_OINTMENT | CUTANEOUS | Status: DC | PRN
Start: 2015-08-21 — End: 2015-08-24

## 2015-08-21 MED ORDER — SIMETHICONE 80 MG PO CHEW
80.0000 mg | CHEWABLE_TABLET | ORAL | Status: DC | PRN
  Filled 2015-08-21: qty 1

## 2015-08-21 MED ORDER — MORPHINE SULFATE (PF) 0.5 MG/ML IJ SOLN
INTRAMUSCULAR | Status: AC
  Filled 2015-08-21: qty 10

## 2015-08-21 MED ORDER — AMOXICILLIN-POT CLAVULANATE 875-125 MG PO TABS
1.0000 | ORAL_TABLET | Freq: Two times a day (BID) | ORAL | Status: DC
  Filled 2015-08-21 (×4): qty 1

## 2015-08-21 MED ORDER — SCOPOLAMINE 1 MG/3DAYS TD PT72: 1.0000 | MEDICATED_PATCH | Freq: Once | TRANSDERMAL | Status: DC

## 2015-08-21 MED ORDER — FENTANYL CITRATE (PF) 100 MCG/2ML IJ SOLN
INTRAMUSCULAR | Status: DC | PRN
  Administered 2015-08-21: 10 ug via INTRATHECAL

## 2015-08-21 MED ORDER — MEPERIDINE HCL 25 MG/ML IJ SOLN: 6.2500 mg | INTRAMUSCULAR | Status: DC | PRN

## 2015-08-21 MED ORDER — OXYTOCIN 10 UNIT/ML IJ SOLN
INTRAMUSCULAR | Status: AC
  Filled 2015-08-21: qty 4

## 2015-08-21 MED ORDER — OXYTOCIN 40 UNITS IN LACTATED RINGERS INFUSION - SIMPLE MED: 62.5000 mL/h | INTRAVENOUS | Status: AC

## 2015-08-21 MED ORDER — DIBUCAINE 1 % RE OINT: 1.0000 "application " | TOPICAL_OINTMENT | RECTAL | Status: DC | PRN

## 2015-08-21 MED ORDER — TETANUS-DIPHTH-ACELL PERTUSSIS 5-2.5-18.5 LF-MCG/0.5 IM SUSP: 0.5000 mL | Freq: Once | INTRAMUSCULAR | Status: DC

## 2015-08-21 MED ORDER — BUPIVACAINE IN DEXTROSE 0.75-8.25 % IT SOLN
INTRATHECAL | Status: DC | PRN
  Administered 2015-08-21: 1.6 mL via INTRATHECAL

## 2015-08-21 MED ORDER — OXYCODONE-ACETAMINOPHEN 5-325 MG PO TABS
2.0000 | ORAL_TABLET | ORAL | Status: DC | PRN
  Administered 2015-08-22 – 2015-08-24 (×6): 2 via ORAL
  Filled 2015-08-21 (×6): qty 2

## 2015-08-21 MED ORDER — DEXTROSE 5 % IV SOLN: 3.0000 g | Freq: Once | INTRAVENOUS | Status: DC

## 2015-08-21 MED ORDER — OXYCODONE-ACETAMINOPHEN 5-325 MG PO TABS
1.0000 | ORAL_TABLET | ORAL | Status: DC | PRN
Start: 2015-08-21 — End: 2015-08-24
  Administered 2015-08-21 – 2015-08-22 (×3): 1 via ORAL
  Filled 2015-08-21 (×3): qty 1

## 2015-08-21 MED ORDER — NALOXONE HCL 2 MG/2ML IJ SOSY
1.0000 ug/kg/h | PREFILLED_SYRINGE | INTRAVENOUS | Status: DC | PRN
  Filled 2015-08-21: qty 2

## 2015-08-21 SURGICAL SUPPLY — 40 items
APPLIER CLIP ROT 10 11.4 M/L (STAPLE) ×3
APR CLP MED LRG 11.4X10 (STAPLE) ×1
CLAMP CORD UMBIL (MISCELLANEOUS) IMPLANT
CLIP APPLIE ROT 10 11.4 M/L (STAPLE) IMPLANT
CLOTH BEACON ORANGE TIMEOUT ST (SAFETY) ×3 IMPLANT
CONTAINER PREFILL 10% NBF 15ML (MISCELLANEOUS) ×6 IMPLANT
DRAPE SHEET LG 3/4 BI-LAMINATE (DRAPES) IMPLANT
DRSG OPSITE POSTOP 4X10 (GAUZE/BANDAGES/DRESSINGS) ×3 IMPLANT
DURAPREP 26ML APPLICATOR (WOUND CARE) ×3 IMPLANT
ELECT REM PT RETURN 9FT ADLT (ELECTROSURGICAL) ×3
ELECTRODE REM PT RTRN 9FT ADLT (ELECTROSURGICAL) ×1 IMPLANT
EXTRACTOR VACUUM M CUP 4 TUBE (SUCTIONS) IMPLANT
EXTRACTOR VACUUM M CUP 4' TUBE (SUCTIONS)
GLOVE BIO SURGEON STRL SZ8 (GLOVE) ×3 IMPLANT
GLOVE BIOGEL PI IND STRL 7.0 (GLOVE) ×1 IMPLANT
GLOVE BIOGEL PI INDICATOR 7.0 (GLOVE) ×2
GOWN STRL REUS W/TWL LRG LVL3 (GOWN DISPOSABLE) ×6 IMPLANT
KIT ABG SYR 3ML LUER SLIP (SYRINGE) IMPLANT
LIQUID BAND (GAUZE/BANDAGES/DRESSINGS) ×3 IMPLANT
NDL HYPO 25X5/8 SAFETYGLIDE (NEEDLE) ×1 IMPLANT
NEEDLE HYPO 22GX1.5 SAFETY (NEEDLE) ×3 IMPLANT
NEEDLE HYPO 25X5/8 SAFETYGLIDE (NEEDLE) ×3 IMPLANT
NS IRRIG 1000ML POUR BTL (IV SOLUTION) ×3 IMPLANT
PACK C SECTION WH (CUSTOM PROCEDURE TRAY) ×3 IMPLANT
PAD OB MATERNITY 4.3X12.25 (PERSONAL CARE ITEMS) ×3 IMPLANT
PENCIL SMOKE EVAC W/HOLSTER (ELECTROSURGICAL) ×3 IMPLANT
RTRCTR C-SECT PINK 25CM LRG (MISCELLANEOUS) ×3 IMPLANT
SUT GUT PLAIN 0 CT-3 TAN 27 (SUTURE) ×3 IMPLANT
SUT MNCRL 0 VIOLET CTX 36 (SUTURE) ×3 IMPLANT
SUT MNCRL AB 4-0 PS2 18 (SUTURE) IMPLANT
SUT MON AB 2-0 CT1 27 (SUTURE) ×3 IMPLANT
SUT MON AB 3-0 SH 27 (SUTURE) ×3
SUT MON AB 3-0 SH27 (SUTURE) IMPLANT
SUT MONOCRYL 0 CTX 36 (SUTURE) ×6
SUT PLAIN 2 0 XLH (SUTURE) IMPLANT
SUT VIC AB 0 CTX 36 (SUTURE) ×6
SUT VIC AB 0 CTX36XBRD ANBCTRL (SUTURE) ×2 IMPLANT
SYR CONTROL 10ML LL (SYRINGE) ×3 IMPLANT
TOWEL OR 17X24 6PK STRL BLUE (TOWEL DISPOSABLE) ×3 IMPLANT
TRAY FOLEY CATH SILVER 14FR (SET/KITS/TRAYS/PACK) ×3 IMPLANT

## 2015-08-21 NOTE — H&P (Signed)
Marisa Yu is a 29 y.o. female presenting for repeat C/S and BTL.. Maternal Medical History:  Fetal activity: Perceived fetal activity is normal.   Last perceived fetal movement was within the past hour.    Prenatal complications: no prenatal complications Prenatal Complications - Diabetes: none.    OB History    Gravida Para Term Preterm AB TAB SAB Ectopic Multiple Living   3 2 2  0 0 0 0 0 0 2     Past Medical History  Diagnosis Date  . Hypertension   . Headache   . Vaginal Pap smear, abnormal   . Boil, arm   . Tooth ache   . Hypothyroidism   . Anemia    Past Surgical History  Procedure Laterality Date  . Cesarean section  2004  . Cesarean section  2009  . Cyst excision    . Wisdom tooth extraction     Family History: family history includes Cancer in her maternal aunt, maternal aunt, and maternal grandmother; Heart disease in her maternal grandfather. Social History:  reports that she quit smoking about 3 years ago. Her smoking use included Cigarettes. She has never used smokeless tobacco. She reports that she does not drink alcohol or use illicit drugs.   Prenatal Transfer Tool  Maternal Diabetes: No Genetic Screening: Normal Maternal Ultrasounds/Referrals: Normal Fetal Ultrasounds or other Referrals:  None Maternal Substance Abuse:  No Significant Maternal Medications:  None Significant Maternal Lab Results:  None Other Comments:  None  Review of Systems  All other systems reviewed and are negative.     Blood pressure 124/89, pulse 89, temperature 98.1 F (36.7 C), temperature source Oral, last menstrual period 11/21/2014, SpO2 100 %. Maternal Exam:  Abdomen: Patient reports no abdominal tenderness.   Physical Exam  Nursing note and vitals reviewed. Constitutional: She is oriented to person, place, and time. She appears well-developed and well-nourished.  HENT:  Head: Normocephalic and atraumatic.  Eyes: Conjunctivae are normal. Pupils are equal,  round, and reactive to light.  Neck: Normal range of motion. Neck supple.  Cardiovascular: Normal rate and regular rhythm.   Respiratory: Effort normal and breath sounds normal.  GI: Soft. Bowel sounds are normal.  Genitourinary: Vagina normal and uterus normal.  Musculoskeletal: Normal range of motion.  Neurological: She is alert and oriented to person, place, and time.  Skin: Skin is warm and dry.  Psychiatric: She has a normal mood and affect. Her behavior is normal. Judgment and thought content normal.    Prenatal labs: ABO, Rh: --/--/B POS, B POS (11/28 1135) Antibody: NEG (11/28 1135) Rubella: 2.50 (05/10 1358) RPR: Non Reactive (11/28 1135)  HBsAg: NEGATIVE (05/10 1358)  HIV: NONREACTIVE (10/05 1311)  GBS: NOT DETECTED (11/01 1123)   Assessment/Plan: 39 weeks.  Previous C/S.  Desires repeat C/S and BTL.   HARPER,CHARLES A 08/21/2015, 6:26 AM

## 2015-08-21 NOTE — Anesthesia Postprocedure Evaluation (Signed)
Anesthesia Post Note  Patient: Marisa Yu  Procedure(s) Performed: Procedure(s) (LRB): CESAREAN SECTION WITH BILATERAL TUBAL LIGATION (Bilateral)  Patient location during evaluation: PACU Anesthesia Type: Spinal Level of consciousness: awake and alert Pain management: pain level controlled Vital Signs Assessment: post-procedure vital signs reviewed and stable Respiratory status: spontaneous breathing, nonlabored ventilation, respiratory function stable and patient connected to nasal cannula oxygen Cardiovascular status: blood pressure returned to baseline and stable Postop Assessment: no signs of nausea or vomiting and spinal receding Anesthetic complications: no    Last Vitals:  Filed Vitals:   08/21/15 1004 08/21/15 1100  BP:  112/70  Pulse: 67 62  Temp:  36.4 C  Resp: 20 18    Last Pain:  Filed Vitals:   08/21/15 1134  PainSc: 0-No pain                 Dequavious Harshberger JENNETTE

## 2015-08-21 NOTE — Transfer of Care (Signed)
Immediate Anesthesia Transfer of Care Note  Patient: Marisa Yu  Procedure(s) Performed: Procedure(s): CESAREAN SECTION WITH BILATERAL TUBAL LIGATION (Bilateral)  Patient Location: PACU  Anesthesia Type:Spinal  Level of Consciousness: awake, alert  and oriented  Airway & Oxygen Therapy: Patient Spontanous Breathing  Post-op Assessment: Report given to RN and Post -op Vital signs reviewed and stable  Post vital signs: Reviewed and stable  Last Vitals:  Filed Vitals:   08/21/15 0604  BP: 124/89  Pulse: 89  Temp: 36.7 C    Complications: No apparent anesthesia complications

## 2015-08-21 NOTE — Anesthesia Procedure Notes (Signed)
Spinal Patient location during procedure: OR Staffing Anesthesiologist: Neeta Storey Performed by: anesthesiologist  Preanesthetic Checklist Completed: patient identified, site marked, surgical consent, pre-op evaluation, timeout performed, IV checked, risks and benefits discussed and monitors and equipment checked Spinal Block Patient position: sitting Prep: DuraPrep Patient monitoring: continuous pulse ox, blood pressure and heart rate Approach: midline Location: L3-4 Injection technique: single-shot Needle Needle type: Sprotte  Needle gauge: 24 G Needle length: 9 cm Additional Notes Functioning IV was confirmed and monitors were applied. Sterile prep and drape, including hand hygiene, mask and sterile gloves were used. The patient was positioned and the spine was prepped. The skin was anesthetized with lidocaine.  Free flow of clear CSF was obtained prior to injecting local anesthetic into the CSF.  The spinal needle aspirated freely following injection.  The needle was carefully withdrawn.  The patient tolerated the procedure well. Consent was obtained prior to procedure with all questions answered and concerns addressed. Risks including but not limited to bleeding, infection, nerve damage, paralysis, failed block, inadequate analgesia, allergic reaction, high spinal, itching and headache were discussed and the patient wished to proceed.   Vaida Kerchner, MD     

## 2015-08-21 NOTE — Addendum Note (Signed)
Addendum  created 08/21/15 1347 by Algis GreenhouseLinda A Melizza Kanode, CRNA   Modules edited: Clinical Notes   Clinical Notes:  File: [id number]

## 2015-08-21 NOTE — Lactation Note (Signed)
This note was copied from the chart of Marisa Yu [place]ot. Lactation Consultation Note  Patient Name: Marisa Yu Ringold Today's Date: 08/21/2015 Reason for consult: Initial assessment This is Mom's 3rd baby but 1st time BF. Basic teaching reviewed with Mom, encouraged to BF with feeding ques. Using pacifier, reviewed risk of pacifier use this early. Assisted Mom with positioning and latching baby. Baby demonstrated some good suckling bursts. Lactation brochure left for review, advised of OP services and support group. Encouraged to call for assist as needed.   Maternal Data Has patient been taught Hand Expression?: Yes Does the patient have breastfeeding experience prior to this delivery?: No  Feeding Feeding Type: Breast Fed  LATCH Score/Interventions Latch: Repeated attempts needed to sustain latch, nipple held in mouth throughout feeding, stimulation needed to elicit sucking reflex. Intervention(s): Adjust position;Assist with latch;Breast massage;Breast compression  Audible Swallowing: A few with stimulation  Type of Nipple: Everted at rest and after stimulation  Comfort (Breast/Nipple): Soft / non-tender     Hold (Positioning): Assistance needed to correctly position infant at breast and maintain latch. Intervention(s): Breastfeeding basics reviewed;Support Pillows;Position options;Skin to skin  LATCH Score: 7  Lactation Tools Discussed/Used WIC Program: Yes   Consult Status Consult Status: Follow-up Date: 08/22/15 Follow-up type: In-patient    Alfred LevinsGranger, Mikka Kissner Ann 08/21/2015, 3:32 PM

## 2015-08-21 NOTE — Anesthesia Postprocedure Evaluation (Signed)
Anesthesia Post Note  Patient: Marisa Yu  Procedure(s) Performed: Procedure(s) (LRB): CESAREAN SECTION WITH BILATERAL TUBAL LIGATION (Bilateral)  Patient location during evaluation: Mother Baby Anesthesia Type: Spinal Level of consciousness: awake Pain management: satisfactory to patient Vital Signs Assessment: post-procedure vital signs reviewed and stable Respiratory status: spontaneous breathing Cardiovascular status: stable Anesthetic complications: no    Last Vitals:  Filed Vitals:   08/21/15 1200 08/21/15 1300  BP: 121/73 121/74  Pulse: 68 72  Temp: 36.5 C 36.7 C  Resp: 18 18    Last Pain:  Filed Vitals:   08/21/15 1311  PainSc: 4                  Yoshito Gaza

## 2015-08-21 NOTE — Op Note (Signed)
Cesarean Section Procedure Note   Marisa Yu   08/21/2015  Indications: Scheduled Proceedure/Maternal Request   Pre-operative Diagnosis: PREV C/S x 2; Desires Repeat C/S; DESIRE STERILZATION.   Post-operative Diagnosis: Same   Surgeon: Marisa Yu  Assistants: Yu, Marisa Yu.  Anesthesia: spinal  Procedure:  Repeat LTCS and BPS  Procedure Details:  The patient was seen in the Holding Room. The risks, benefits, complications, treatment options, and expected outcomes were discussed with the patient. The patient concurred with the proposed plan, giving informed consent. The patient was identified as Marisa Yu and the procedure verified as C-Section Delivery. Yu Time Out was held and the above information confirmed.  After induction of anesthesia, the patient was draped and prepped in the usual sterile manner. Yu transverse incision was made and carried down through the subcutaneous tissue to the fascia. The fascial incision was made and extended transversely. The fascia was separated from the underlying rectus tissue superiorly and inferiorly. The peritoneum was identified and entered. The peritoneal incision was extended longitudinally. The utero-vesical peritoneal reflection was incised transversely and the bladder flap was bluntly freed from the lower uterine segment. Yu low transverse uterine incision was made. Delivered from cephalic presentation was Yu 3145 gram living newborn female infant(s). APGAR (1 MIN): 8   APGAR (5 MINS): 9   APGAR (10 MINS):    Yu cord ph was not sent. The umbilical cord was clamped and cut cord. Yu sample was obtained for evaluation. The placenta was removed Intact and appeared normal.  The uterine incision was closed with running locked sutures of 1-0 Monocryl. Yu second imbricating layer of the same suture was placed.  Hemostasis was observed.  Attention then turned above to tubal ligation.  The left fallopian tube was grasped with Yu Babcock clamp in  the isthmic portion of the tube.  The tube was followed from the corneal end to the fimbrial end while in the grasp of the clamp.  The knuckle of tube beneath the clamp was suture ligated through the mesosalpinx and the knuckle of tube was excised above the knot and submitted to pathology.  The tubal stumps were hemostatic and were then placed back in their normal anatomic position. The same procedure was performed on the opposite side without complications. The paracolic gutters were irrigated. The parieto peritoneum was closed in Yu running fashion with 2-0 Vicryl.  The fascia was then reapproximated with running sutures of 0 Vicryl.  The skin was closed with staples.  Instrument, sponge, and needle counts were correct prior the abdominal closure and were correct at the conclusion of the case.    Findings: Normal uterus, ovaries and tubes   Estimated Blood Loss: 700 ml  Total IV Fluids: 2600ml   Urine Output: 75CC OF clear urine  Specimens: ~ 2 cm segment of left and right fallopian tube  Complications: no complications  Disposition: PACU - hemodynamically stable.  Maternal Condition: stable   Baby condition / location:  Couplet care / Skin to Skin    Signed: Surgeon(s): Marisa Badharles Yu Bryttani Blew, MD

## 2015-08-22 ENCOUNTER — Encounter (HOSPITAL_COMMUNITY): Payer: Self-pay | Admitting: Obstetrics

## 2015-08-22 LAB — CBC
HEMATOCRIT: 27.3 % — AB (ref 36.0–46.0)
HEMOGLOBIN: 8.4 g/dL — AB (ref 12.0–15.0)
MCH: 22.7 pg — ABNORMAL LOW (ref 26.0–34.0)
MCHC: 30.8 g/dL (ref 30.0–36.0)
MCV: 73.8 fL — ABNORMAL LOW (ref 78.0–100.0)
Platelets: 309 10*3/uL (ref 150–400)
RBC: 3.7 MIL/uL — ABNORMAL LOW (ref 3.87–5.11)
RDW: 16.5 % — AB (ref 11.5–15.5)
WBC: 7.8 10*3/uL (ref 4.0–10.5)

## 2015-08-22 LAB — WOUND CULTURE: Gram Stain: NONE SEEN

## 2015-08-22 LAB — BIRTH TISSUE RECOVERY COLLECTION (PLACENTA DONATION)

## 2015-08-22 MED ORDER — AMOXICILLIN-POT CLAVULANATE 875-125 MG PO TABS
1.0000 | ORAL_TABLET | Freq: Two times a day (BID) | ORAL | Status: DC
  Administered 2015-08-22 – 2015-08-23 (×4): 1 via ORAL
  Filled 2015-08-22 (×5): qty 1

## 2015-08-22 MED ORDER — FERROUS SULFATE 325 (65 FE) MG PO TABS
325.0000 mg | ORAL_TABLET | Freq: Three times a day (TID) | ORAL | Status: DC
Start: 2015-08-22 — End: 2015-08-24
  Administered 2015-08-22 – 2015-08-24 (×6): 325 mg via ORAL
  Filled 2015-08-22 (×6): qty 1

## 2015-08-22 NOTE — Progress Notes (Signed)
Patient ID: Marisa Yu, female   DOB: Feb 17, 1986, 29 y.o.   MRN: [id number] Subjective: POD# 1 s/p Cesarean Delivery.  Indications: repeat section with BTL  RH status/Rubella reviewed. Feeding: breast Patient reports tolerating PO.  Denies HA/SOB/C/P/N/V/dizziness.  Reports flatus or BM. Breast symptoms: No..  She reports vaginal bleeding as normal, without clots.  She is ambulating, urinating without difficulty.     Objective: Vital signs in last 24 hours: BP 109/87 mmHg  Pulse 84  Temp(Src) 98 F (36.7 C) (Oral)  Resp 18  SpO2 98%  LMP 11/21/2014  Breastfeeding? Unknown       Physical Exam:  General: alert, cooperative and no distress CV: Regular rate and rhythm, S1S2 present or without murmur or extra heart sounds Resp: clear Abdomen: soft, nontender, normal bowel sounds Lochia: minimal Uterine Fundus: firm, below umbilicus, nontender Incision: intact and serous drainage present Ext: extremities normal, atraumatic, no cyanosis or edema, Homans sign is negative, no sign of DVT and no edema, redness or tenderness in the calves or thighs    Recent Labs  08/19/15 1135 08/22/15 0600  HGB 9.2* 8.4*  HCT 30.3* 27.3*      Assessment/Plan: 29 y.o.  status post Cesarean section. POD# 1.   Doing well, stable.              Advance diet as tolerated Start po pain meds D/C foley  HLIV  Ambulate IS Routine post-op care  Valina Maes A Daveyon Kitchings 08/22/2015, 8:44 AM

## 2015-08-22 NOTE — Clinical Social Work Maternal (Signed)
CLINICAL SOCIAL WORK MATERNAL/CHILD NOTE  Patient Details  Name: Marisa Yu MRN: [id number] Date of Birth: Mar 28, 1986  Date:  08/22/2015  Clinical Social Worker Initiating Note:  Loleta BooksSarah Kamden Reber MSW, LCSW Date/ Time Initiated:  08/22/15/1315     Child's Name:  Marisa Yu   Legal Guardian:  Marisa Yu  Need for Interpreter:  None   Date of Referral:  08/21/15     Reason for Referral:  History of depression  [facility]   Address:  [address] [place], [place] [address]  Phone number:  [phone]   Household Members:  Minor Children (ages 4413 and 8), Self   Natural Supports (not living in the home):  Extended Family, Immediate Family, FOB   Professional Supports: None   Employment: Full-time   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 MOB was prescribed Zoloft during the pregnancy due to increase in symptoms; however, she stated that she did not take consistently.  MOB reported that she continues to have the prescription if needed, but discussed preference to not use medication. MOB receptive to new referral for therapy. 2) Family relational stress: MOB reported that FOB has been minimally involved/supprotive during the pregnancy. The MOB identified this as the primary stressor during the pregnancy. The FOB stated that he is now committed to being present for the infant. The MOB and FOB reported that they do not live together, are not in a relationship together, but intend to try to "co-parent".     Cognitive State:  Able to Concentrate , Alert , Goal Oriented , Linear Thinking     Mood/Affect:  Calm , Interested    CSW Assessment:  CSW received request for consult due to MOB presenting with a history of depression.  MOB presented as tired, as evidenced by limited range in affect; however, she agreed to CSW visit.  MOB provided consent for the FOB to remain in the room during the assessment.  FOB attempted to dominate the conversation, and CSW had to directly ask questions to MOB in order to receive her answers to questions.   No interactions noted between MOB and infant since infant was in the crib.  Per MOB, she presents with history of depression since "birth". She stated that "a lot has happened" in the past, and shared that she is used to depressive symptoms.  Per MOB, she cannot imagine a life without depression, and is unsure what life would look like if she did not have symptoms. She shared belief that she has learned to cope through the years, and prefers to not be on medication since she does not want to get "addicted to pills".  MOB reported that she has never felt a benefit from an antidepressant.    MOB reported numerous psychosocial stressors during the pregnancy. She stated that it was an unplanned pregnancy, and reported that she felt overwhelmed and emotional with the news of the pregnancy. She reported that she also worked 2 different jobs, and felt unsupported by her employers. Per MOB, there was additional relational stress with FOB, and FOB confirmed that he was not present "  the way I needed to be".  MOB reported that she is "over it all", and shared belief that her depressive symptoms have improved in recent weeks. She identified numerous symptoms during the pregnancy (inability to sleep, mood lability, emotional, tearful); but shared that it was difficult to know if she experienced depression during final weeks of the pregnancy since she was physically uncomfortable.  During the pregnancy, MOB reported that she talked to her OB providers about her mood, and  they recommended Zoloft. She stated that she did not take the medication consistently, and reported that she did not want the medication while at the hospital. Per MOB, she was unable to identify any changes in her mood with the prescription.  CSW provided education on SSRIs, including it being a non-habit forming medication, with receiving full benefit of medication when taken consistently. MOB acknowledged the information, but did not indicate intentions postpartum regarding medication. Per chart review, MOB was referred to therapy at [facility].  MOB confirmed that she participated in therapy, but that it was brief due to change in insurance.  CSW noted that MOB has Medicaid, and MOB confirmed that insurance is now consistent and in place.  MOB requested new referral for therapy since [facility] does accept Medicaid.  CSW to complete.  MOB expressed appreciation since she believes it is helpful for her to have someone to process her feelings with.  MOB also shared it is helpful to be able to express her frustrations and feelings with FOB.  FOB expressed intention to listen to MOB, and shared that they currently are working towards "co-parenting".   MOB also stated that she is currently only working one job, and hopes to look for new employment in the future in order to continue to reduce stress.  MOB denied history of postpartum depression, but also acknowledged challenges of discerning the differences between perinatal mood disorders and depression.  MOB acknowledged heightened risk for ongoing mood complications as she transitions postpartum.  She acknowledged commonality of symptoms, hormonal components, and is aware that there are evidence based treatments to address symptoms.    MOB denied need for additional help or support at this time, but acknowledged ongoing role of CSW if needs arise during the admission.   CSW Plan/Description:   1)Patient/Family Education: Perinatal mood  and anxiety disorders  2)Information/Referral to [facility]: New referral to [employer]. 3) CSW recommends closely monitoring MOB's mood due to heightened risk for perinatal mood disorders (symptoms during the pregnancy, history of chronic depression, and presence of psychosocial stressors). 4)No Further Intervention Required/No Barriers to Discharge    Pervis Hocking, LCSW 08/22/2015, 1:40 PM

## 2015-08-22 NOTE — Lactation Note (Signed)
This note was copied from the chart of Marisa Kirt Boysshley Recine. Lactation Consultation Note  RN alerted LC that mother has been complaining of nipple soreness.  Mother has large breasts. Mother recently bf baby for 10 min.  Noted small abrasion on tip of right nipple.  She has comfort gels. Reviewed hand expression.  Good drops expressed. Mother latched baby in football hold.  Latch shallow.  Mother did not wait until baby opens wide. Reviewed how to achieve a deeper latch, tickle baby's upper lip and wait for wide gape. Baby latched deeper on the breast.  Intermittent sucks and swallows observed. Noted mother hold tissue back from infant's face.  Encouraged her to massage forward and provided education.   Patient Name: Marisa Yu NWGNF'AToday's Date: 08/22/2015 Reason for consult: Follow-up assessment   Maternal Data    Feeding Feeding Type: Breast Fed Length of feed: 10 min  LATCH Score/Interventions Latch: Grasps breast easily, tongue down, lips flanged, rhythmical sucking. Intervention(s): Adjust position;Assist with latch;Breast massage  Audible Swallowing: A few with stimulation Intervention(s): Skin to skin;Hand expression  Type of Nipple: Everted at rest and after stimulation  Comfort (Breast/Nipple): Filling, red/small blisters or bruises, mild/mod discomfort  Problem noted: Mild/Moderate discomfort Interventions (Mild/moderate discomfort): Comfort gels;Hand expression  Hold (Positioning): Assistance needed to correctly position infant at breast and maintain latch.  LATCH Score: 7  Lactation Tools Discussed/Used     Consult Status Consult Status: Follow-up Date: 08/23/15 Follow-up type: In-patient    Dahlia ByesBerkelhammer, Talha Iser [facility] 08/22/2015, 10:39 AM

## 2015-08-22 NOTE — Progress Notes (Signed)
UR chart review completed.  

## 2015-08-23 LAB — TYPE AND SCREEN
ABO/RH(D): B POS
Antibody Screen: NEGATIVE
UNIT DIVISION: 0
Unit division: 0

## 2015-08-23 MED ORDER — PROMETHAZINE HCL 25 MG PO TABS
25.0000 mg | ORAL_TABLET | Freq: Four times a day (QID) | ORAL | Status: DC | PRN
  Administered 2015-08-23: 25 mg via ORAL
  Filled 2015-08-23: qty 1

## 2015-08-23 NOTE — Lactation Note (Addendum)
This note was copied from the chart of Marisa Yu Ayub. Lactation Consultation Note follow up visit at 60 hours of age.  Mom has baby laying across her legs while sitting on couch with baby latched to very large pendulous breast.  Mom has full breast that require mom to compress "sandwich" breast tissue to help baby maintain latch.  Encouraged mom to do this and use pillow for support to keep baby up onto breast latched deeply.  Mom complains of soreness and encouraged this may help.  Baby has intermittent gulping and then mom thinks baby is done. Mom does not want to pump now and doesn't want baby to get used to syringe feedings.  Baby had 4 % weight loss at 1st night and encouraged mom to pump and offer supplement with each feeding to ensure baby is getting enough.  LC feels baby is small, sleepy and not very efficient at the breast yet..  Mom and LC supplemented about 14mls of EBM with syringe baby tolerated well and then had a void and stool. MOm has pumped twice and expressed 90mls.  Encouraged mom to label and place in refrigerator for storage.  Mom may need to pre pump to soften breast to allow baby a deep latch and will post pump for comfort to collect EBM to supplement baby with after breast feedings.  MOm may need reinforcement with plan.  LC gave soap and cleaning supplies and reviewed cleaning and storage for EBM.  Mom to call for assist as needed.      Patient Name: Marisa Yu Bringle ZOXWR'UToday's Date: 08/23/2015 Reason for consult: Follow-up assessment;Breast/nipple pain   Maternal Data Has patient been taught Hand Expression?: Yes  Feeding Feeding Type: Breast Fed Length of feed: 8 min  LATCH Score/Interventions Latch: Repeated attempts needed to sustain latch, nipple held in mouth throughout feeding, stimulation needed to elicit sucking reflex. Intervention(s): Adjust position;Assist with latch;Breast massage;Breast compression  Audible Swallowing: Spontaneous and  intermittent Intervention(s): Skin to skin;Hand expression Intervention(s): Skin to skin  Type of Nipple: Everted at rest and after stimulation  Comfort (Breast/Nipple): Filling, red/small blisters or bruises, mild/mod discomfort  Problem noted: Mild/Moderate discomfort Interventions  (Cracked/bleeding/bruising/blister): Expressed breast milk to nipple Interventions (Mild/moderate discomfort): Hand expression  Hold (Positioning): Assistance needed to correctly position infant at breast and maintain latch. Intervention(s): Breastfeeding basics reviewed;Support Pillows;Position options;Skin to skin  LATCH Score: 7  Lactation Tools Discussed/Used     Consult Status Consult Status: Follow-up Date: 08/24/15 Follow-up type: In-patient    Jannifer RodneyShoptaw, Noela Brothers Lynn 08/23/2015, 8:46 PM

## 2015-08-23 NOTE — Progress Notes (Signed)
Patient ID: Marisa Yu, female   DOB: February 28, 1986, 29 y.o.   MRN: [id number]  Subjective: POD# 2 s/p Cesarean Delivery.  Indications: repeat section with BTL  RH status/Rubella reviewed. Feeding: breast, is having trouble with latching and expressing breast milk.  Patient reports tolerating PO.  Denies HA/SOB/C/P/N/V/dizziness.  Reports flatus or BM. Breast symptoms: No..  She reports vaginal bleeding as normal, without clots.  She is ambulating, urinating without difficulty.     Objective: Vital signs in last 24 hours: BP 120/73 mmHg  Pulse 76  Temp(Src) 98.2 F (36.8 C) (Oral)  Resp 17  SpO2 98%  LMP 11/21/2014  Breastfeeding? Unknown       Physical Exam:  General: alert, cooperative and no distress CV: Regular rate and rhythm, S1S2 present or without murmur or extra heart sounds Resp: clear Abdomen: soft, nontender, normal bowel sounds Lochia: minimal Uterine Fundus: firm, below umbilicus, tender Incision: intact and serous drainage present Ext: extremities normal, atraumatic, no cyanosis or edema, Homans sign is negative, no sign of DVT and no edema, redness or tenderness in the calves or thighs    Recent Labs  08/22/15 0600  HGB 8.4*  HCT 27.3*      Assessment/Plan: 29 y.o.  status post Cesarean section. POD# 2.   Doing well, stable.   Anemic. Lactation consult            Advance diet as tolerated Continue po pain meds HLIV  Ambulate IS Routine post-op care  Shaylin Blatt A Rylon Poitra 08/23/2015, 8:26 AM

## 2015-08-24 NOTE — Discharge Instructions (Signed)
Discharge instructions ° °· You can wash your hair °· Shower °· Eat what you want °· Drink what you want °· See me in 6 weeks °· Your ankles are going to swell more in the next 2 weeks than when pregnant °· No sex for 6 weeks ° ° °Kennidee Heyne A, MD 10/13/2014 ° ° °

## 2015-08-24 NOTE — Discharge Summary (Signed)
Obstetric Discharge Summary Reason for Admission: induction of labor and cesarean section Prenatal Procedures: none Intrapartum Procedures: cesarean: low cervical, transverse Postpartum Procedures: none Complications-Operative and Postpartum: none HEMOGLOBIN  Date Value Ref Range Status  08/22/2015 8.4* 12.0 - 15.0 g/dL Final   HCT  Date Value Ref Range Status  08/22/2015 27.3* 36.0 - 46.0 % Final    Physical Exam:  General: alert Lochia: appropriate Uterine Fundus: firm Incision: healing well DVT Evaluation: No evidence of DVT seen on physical exam.  Discharge Diagnoses: Term Pregnancy-delivered  Discharge Information: Date: 08/24/2015 Activity: pelvic rest Diet: routine Medications: Percocet Condition: improved Instructions: refer to practice specific booklet Discharge to: home Follow-up Information    Follow up with HARPER,CHARLES A, MD.   Specialty:  Obstetrics and Gynecology   Contact information:   [address] [place] [address] [phone]       Newborn Data: Live born female  Birth Weight: 6 lb 14.9 oz (3145 g) APGAR: 8, 9  Home with mother.  MARSHALL,BERNARD A 08/24/2015, 7:33 AM

## 2015-08-24 NOTE — Lactation Note (Signed)
This note was copied from the chart of Marisa Yu. Lactation Consultation Note  Mom states breastfeeding is going well.  She is post pumping and syringe feeding.  Denies breast pain.  No questions or concerns at present.  She plans on purchasing a DEBP and knows how to use the manual.  Reviewed outpatient services/support after discharge and encouraged to call prn.  Patient Name: Marisa Yu ZOXWR'UToday's Date: 08/24/2015 Reason for consult: Follow-up assessment   Maternal Data    Feeding Feeding Type: Breast Fed Length of feed: 7 min  LATCH Score/Interventions Latch: Grasps breast easily, tongue down, lips flanged, rhythmical sucking.  Audible Swallowing: Spontaneous and intermittent  Type of Nipple: Everted at rest and after stimulation  Comfort (Breast/Nipple): Filling, red/small blisters or bruises, mild/mod discomfort     Hold (Positioning): Assistance needed to correctly position infant at breast and maintain latch.  LATCH Score: 8  Lactation Tools Discussed/Used     Consult Status Consult Status: Complete    Huston FoleyMOULDEN, Zenaida Tesar S 08/24/2015, 9:49 AM

## 2015-08-24 NOTE — Progress Notes (Signed)
Patient ID: Marisa Yu, female   DOB: 02-06-1986, 29 y.o.   MRN: [id number] Postop day 3 Vital signs normal Fundus firm Incision clean and dry Home today

## 2015-09-04 ENCOUNTER — Encounter: Payer: Self-pay | Admitting: Certified Nurse Midwife

## 2015-09-04 ENCOUNTER — Ambulatory Visit (INDEPENDENT_AMBULATORY_CARE_PROVIDER_SITE_OTHER): Payer: Medicaid Other | Admitting: Certified Nurse Midwife

## 2015-09-04 NOTE — Progress Notes (Signed)
Patient ID: Marisa Quailsshley M Dobias, female   DOB: 1985-11-12, 29 y.o.   MRN: [id number]  Subjective:     Marisa Yu is a 29 y.o. female who presents for a postpartum visit. She is 2 weeks postpartum following a low cervical transverse Cesarean section. I have fully reviewed the prenatal and intrapartum course. The delivery was at 39 gestational weeks. Outcome: repeat cesarean section, classical incision and BTL. Anesthesia: spinal. Postpartum course has been normal. Baby's course has been normal. Baby is feeding by breast. Bleeding thin lochia and red. Bowel function is normal. Bladder function is normal. Patient is not sexually active. Contraception method is tubal ligation. Postpartum depression screening: negative.  Tobacco, alcohol and substance abuse history reviewed.  Adult immunizations reviewed including TDAP, rubella and varicella.  The following portions of the patient's history were reviewed and updated as appropriate: allergies, current medications, past family history, past medical history, past social history, past surgical history and problem list.  Review of Systems Pertinent items noted in HPI and remainder of comprehensive ROS otherwise negative.   Objective:    BP 119/85 mmHg  Pulse 84  Temp(Src) 98.1 F (36.7 C)  Ht 5\' 5"  (1.651 m)  Wt 231 lb (104.781 kg)  BMI 38.44 kg/m2  LMP 11/21/2014  Breastfeeding? Yes  General:  alert, cooperative and no distress   Breasts:  inspection negative, no nipple discharge or bleeding, no masses or nodularity palpable  Lungs: clear to auscultation bilaterally  Heart:  regular rate and rhythm, S1, S2 normal, no murmur, click, rub or gallop  Abdomen: soft, non-tender; bowel sounds normal; no masses,  no organomegaly   Vulva:  normal  Vagina: not evaluated  Cervix:  not evaluated  Corpus: not examined  Adnexa:  not evaluated  Rectal Exam: Not performed.       C-section incision: well approximated, small 2-3 cm section on lower right side  slightly open, no drainage, non-tender, no erythema.     50% of 15 min visit spent on counseling and coordination of care.  Assessment:     Normal 2 week postpartum C-section exam. Pap smear not done at today's visit.  Plan:    1. Contraception: tubal ligation 2.  C-section scar care education given.  3. Follow up in: 4 weeks or as needed.  2hr GTT for h/o GDM/screening for DM q 3 yrs per ADA recommendations Preconception counseling provided Healthy lifestyle practices reviewed

## 2015-09-17 ENCOUNTER — Telehealth: Payer: Self-pay | Admitting: Certified Nurse Midwife

## 2015-09-17 NOTE — Telephone Encounter (Signed)
Marisa Yu called and stated baby has a white staff in and around his mouth baby has appt tomorrow with his doctor but Yu stated that her nipple is sour she will like to know what to do? Dr. Clearance CootsHarper just mentioned Yu needs medication too.   Marisa Yu

## 2015-09-19 NOTE — Telephone Encounter (Signed)
Please have the patient make an appointment.  Thank you.  R.Krosby Ritchie CNM

## 2015-09-20 ENCOUNTER — Ambulatory Visit (INDEPENDENT_AMBULATORY_CARE_PROVIDER_SITE_OTHER): Payer: Medicaid Other | Admitting: Obstetrics

## 2015-09-20 VITALS — BP 124/92 | HR 94 | Temp 97.5°F | Wt 231.0 lb

## 2015-09-20 DIAGNOSIS — B372 Candidiasis of skin and nail: Secondary | ICD-10-CM

## 2015-09-21 ENCOUNTER — Encounter: Payer: Self-pay | Admitting: Obstetrics

## 2015-09-21 MED ORDER — NYSTATIN 100000 UNIT/GM EX CREA: 1.0000 "application " | TOPICAL_CREAM | Freq: Two times a day (BID) | CUTANEOUS | Status: DC

## 2015-09-21 NOTE — Progress Notes (Signed)
Patient ID: Marisa Yu, female   DOB: May 20, 1986, 29 y.o.   MRN: [id number]  Chief Complaint  Patient presents with  . Breast Problem    Baby has thrush and is being treated being treated. Patient needs treatment as well.  . Wound Check    patient states her incision is bleeding and has an odor.    HPI Marisa Yu is a 29 y.o. female.  Baby has thrush an patient complains of itching and painful nipples.  HPI  Past Medical History  Diagnosis Date  . Hypertension   . Headache   . Vaginal Pap smear, abnormal   . Boil, arm   . Tooth ache   . Hypothyroidism   . Anemia     Past Surgical History  Procedure Laterality Date  . Cesarean section  2004  . Cesarean section  2009  . Cyst excision    . Wisdom tooth extraction    . Cesarean section with bilateral tubal ligation Bilateral 08/21/2015    Procedure: CESAREAN SECTION WITH BILATERAL TUBAL LIGATION;  Surgeon: Brock Badharles A Harper, MD;  Location: [facility];  Service: Obstetrics;  Laterality: Bilateral;    Family History  Problem Relation Age of Onset  . Cancer Maternal Aunt     Breast  . Cancer Maternal Grandmother     Lung  . Cancer Maternal Aunt     Cervical  . Heart disease Maternal Grandfather     Social History Social History  Substance Use Topics  . Smoking status: Former Smoker    Types: Cigarettes    Quit date: 09/22/2011  . Smokeless tobacco: Never Used  . Alcohol Use: No    Allergies  Allergen Reactions  . Codeine Itching    Current Outpatient Prescriptions  Medication Sig Dispense Refill  . Prenatal Vit-Fe Fumarate-FA (PRENATAL VITAMINS PLUS) 27-1 MG TABS Take by mouth.    . nystatin cream (MYCOSTATIN) Apply 1 application topically 2 (two) times daily. 60 g 1   No current facility-administered medications for this visit.    Review of Systems Review of Systems Constitutional: negative for fatigue and weight loss Respiratory: negative for cough and wheezing Cardiovascular: negative for chest  pain, fatigue and palpitations Gastrointestinal: negative for abdominal pain and change in bowel habits Genitourinary:negative Integument/breast: negative for nipple discharge.  Positive for itching and painful nipples. Musculoskeletal:negative for myalgias Neurological: negative for gait problems and tremors Behavioral/Psych: negative for abusive relationship, depression Endocrine: negative for temperature intolerance     Blood pressure 124/92, pulse 94, temperature 97.5 F (36.4 C), weight 231 lb (104.781 kg), currently breastfeeding.  Physical Exam Physical Exam                General:  Alert and no distress           Breasts:  Nipples tender and cracking.  Data Reviewed Labs  Assessment     Candida dermatitis of breast, superficial     Plan    Nystatin cream Rx F/U prn   No orders of the defined types were placed in this encounter.   Meds ordered this encounter  Medications  . nystatin cream (MYCOSTATIN)    Sig: Apply 1 application topically 2 (two) times daily.    Dispense:  60 g    Refill:  1

## 2015-10-02 ENCOUNTER — Encounter: Payer: Self-pay | Admitting: Certified Nurse Midwife

## 2015-10-02 ENCOUNTER — Ambulatory Visit (INDEPENDENT_AMBULATORY_CARE_PROVIDER_SITE_OTHER): Payer: Medicaid Other | Admitting: Obstetrics

## 2015-10-02 DIAGNOSIS — Z30011 Encounter for initial prescription of contraceptive pills: Secondary | ICD-10-CM

## 2015-10-02 MED ORDER — NORETHINDRONE-ETH ESTRADIOL 1-35 MG-MCG PO TABS: 1.0000 | ORAL_TABLET | Freq: Every day | ORAL | Status: DC

## 2015-10-02 NOTE — Progress Notes (Signed)
Subjective:     Marisa Yu is a 30 y.o. female who presents for a postpartum visit. She is 6 weeks postpartum following a low cervical transverse Cesarean section. I have fully reviewed the prenatal and intrapartum course. The delivery was at 39 gestational weeks. Outcome: repeat cesarean section, low transverse incision and BTL. Anesthesia: spinal. Postpartum course has been normal. Baby's course has been normal. Baby is feeding by breast. Bleeding no bleeding. Bowel function is normal. Bladder function is normal. Patient is not sexually active. Contraception method is tubal ligation. Postpartum depression screening: negative.  Tobacco, alcohol and substance abuse history reviewed.  Adult immunizations reviewed including TDAP, rubella and varicella.  The following portions of the patient's history were reviewed and updated as appropriate: allergies, current medications, past family history, past medical history, past social history, past surgical history and problem list.  Review of Systems A comprehensive review of systems was negative except for: Behavioral/Psych: positive for depression   Objective:    BP 138/90 mmHg  Pulse 80  Wt 227 lb (102.967 kg)  General:  alert and no distress   Breasts:  inspection negative, no nipple discharge or bleeding, no masses or nodularity palpable  Lungs: clear to auscultation bilaterally  Heart:  regular rate and rhythm, S1, S2 normal, no murmur, click, rub or gallop  Abdomen: normal findings: soft, non-tender   Vulva:  normal  Vagina: normal vagina  Cervix:  no cervical motion tenderness  Corpus: normal size, contour, position, consistency, mobility, non-tender  Adnexa:  no mass, fullness, tenderness  Rectal Exam: Not performed.           Assessment:     Normal postpartum exam. Pap smear not done at today's visit.     H/O irregular cycles.  Wants OCP's for cycle regulation  Plan:    1. Contraception: tubal ligation 2. Ortho Novum 1/35 Rx  for cycle regulation 3. Follow up in: 6 months or as needed.   Healthy lifestyle practices reviewed

## 2015-10-09 ENCOUNTER — Other Ambulatory Visit: Payer: Self-pay | Admitting: Certified Nurse Midwife

## 2015-10-09 ENCOUNTER — Telehealth: Payer: Self-pay | Admitting: *Deleted

## 2015-10-09 DIAGNOSIS — L732 Hidradenitis suppurativa: Secondary | ICD-10-CM

## 2015-10-09 MED ORDER — CEPHALEXIN 500 MG PO CAPS: 500.0000 mg | ORAL_CAPSULE | Freq: Three times a day (TID) | ORAL | Status: DC

## 2015-10-09 NOTE — Telephone Encounter (Signed)
Please let her know that I have sent in Keflex for her HS.  Thank you.  R.Rasha Ibe CNM

## 2015-10-09 NOTE — Telephone Encounter (Signed)
Patient states she had a cyst that openned and is draining on her head and she is requesting antibiotics for it. Can we send some in for her because she knows what the problem is.

## 2015-10-10 NOTE — Telephone Encounter (Signed)
LM on VM- Rx at pharmacy 

## 2015-11-19 ENCOUNTER — Telehealth: Payer: Self-pay | Admitting: *Deleted

## 2015-11-19 DIAGNOSIS — F32A Depression, unspecified: Secondary | ICD-10-CM

## 2015-11-19 DIAGNOSIS — F329 Major depressive disorder, single episode, unspecified: Secondary | ICD-10-CM

## 2015-11-19 MED ORDER — SERTRALINE HCL 50 MG PO TABS: 50.0000 mg | ORAL_TABLET | Freq: Two times a day (BID) | ORAL | Status: DC

## 2015-11-19 NOTE — Telephone Encounter (Signed)
Patient wants to know if her dose on Zoloft can be increased- it is not working. 4:42 Call to patient- she does say that she does not feel as frustrated as she was- so maybe she is getting effect from it. Talked about the other choices available if this does not work. Per Rachelle- increase dose to 100 mg daily.

## 2016-01-01 ENCOUNTER — Encounter: Payer: Self-pay | Admitting: Certified Nurse Midwife

## 2016-01-01 ENCOUNTER — Ambulatory Visit (INDEPENDENT_AMBULATORY_CARE_PROVIDER_SITE_OTHER): Payer: BLUE CROSS/BLUE SHIELD | Admitting: Certified Nurse Midwife

## 2016-01-01 VITALS — BP 126/83 | HR 84 | Wt 247.0 lb

## 2016-01-01 DIAGNOSIS — Z30011 Encounter for initial prescription of contraceptive pills: Secondary | ICD-10-CM

## 2016-01-01 DIAGNOSIS — E669 Obesity, unspecified: Secondary | ICD-10-CM

## 2016-01-01 DIAGNOSIS — Z01419 Encounter for gynecological examination (general) (routine) without abnormal findings: Secondary | ICD-10-CM

## 2016-01-01 DIAGNOSIS — Z113 Encounter for screening for infections with a predominantly sexual mode of transmission: Secondary | ICD-10-CM

## 2016-01-01 MED ORDER — PHENTERMINE HCL 37.5 MG PO CAPS: 37.5000 mg | ORAL_CAPSULE | ORAL | Status: DC

## 2016-01-01 MED ORDER — NORETHINDRONE-ETH ESTRADIOL 1-35 MG-MCG PO TABS: 1.0000 | ORAL_TABLET | Freq: Every day | ORAL | Status: DC

## 2016-01-01 MED ORDER — PRENATE MINI 29-0.6-0.4-350 MG PO CAPS: 1.0000 | ORAL_CAPSULE | Freq: Every day | ORAL | Status: DC

## 2016-01-01 NOTE — Progress Notes (Signed)
Patient ID: Marisa Yu, female   DOB: January 02, 1986, 30 y.o.   MRN: [id number]    Subjective:      Marisa Quailsshley M Christians is a 30 y.o. female here for a routine exam.  Current complaints: here for STD testing.  Recent delivery via C-section.  Desires weight loss and breast reduction.  Desires to wait on breast reduction until after her weight loss. Is currently exercising.   Is having heavy periods, 1 X month, denies any clots or cramping.   Has tried Mirena 2X in the past and had AUB with it.  States that when she takes the OCPs her period is about 3 days.    Personal health questionnaire:  Is patient [name], have a family history of breast and/or ovarian cancer: no Is there a family history of uterine cancer diagnosed at age < 9750, gastrointestinal cancer, urinary tract cancer, family member who is a Personnel officerLynch syndrome-associated carrier: unknown cancers on maternal side of the family Is the patient overweight and hypertensive, family history of diabetes, personal history of gestational diabetes, preeclampsia or PCOS: yes Is patient over 2255, have PCOS,  family history of premature CHD under age 30, diabetes, smoke, have hypertension or peripheral artery disease:  yes At any time, has a partner hit, kicked or otherwise hurt or frightened you?: no Over the past 2 weeks, have you felt down, depressed or hopeless?: no, is currently on Zoloft and doing well with it.  Over the past 2 weeks, have you felt little interest or pleasure in doing things?:no   Gynecologic History Patient's last menstrual period was 12/25/2015. Contraception: OCP (estrogen/progesterone) and tubal ligation Last Pap: 01/01/15. Results were: normal Last mammogram: N/A.   Obstetric History OB History  Gravida Para Term Preterm AB SAB TAB Ectopic Multiple Living  3 3 3  0 0 0 0 0 0 3    # Outcome Date GA Lbr Len/2nd Weight Sex Delivery Anes PTL Lv  3 Term 08/21/15 671w0d  6 lb 14.9 oz (3.145 kg) M CS-LVertical Spinal  Y  2  Term 02/08/08 781w1d  8 lb (3.629 kg) F CS-LTranv EPI N Y  1 Term 11/14/02 7079w0d  7 lb 13 oz (3.544 kg) M CS-LTranv EPI N Y     Complications: Fetal Intolerance      Past Medical History  Diagnosis Date  . Hypertension   . Headache   . Vaginal Pap smear, abnormal   . Boil, arm   . Tooth ache   . Hypothyroidism   . Anemia     Past Surgical History  Procedure Laterality Date  . Cesarean section  2004  . Cesarean section  2009  . Cyst excision    . Wisdom tooth extraction    . Cesarean section with bilateral tubal ligation Bilateral 08/21/2015    Procedure: CESAREAN SECTION WITH BILATERAL TUBAL LIGATION;  Surgeon: Brock Badharles A Harper, MD;  Location: [facility];  Service: Obstetrics;  Laterality: Bilateral;     Current outpatient prescriptions:  .  norethindrone-ethinyl estradiol 1/35 (NECON 1/35, 28,) tablet, Take 1 tablet by mouth daily., Disp: 3 Package, Rfl: 4 .  Prenatal Vit-Fe Fumarate-FA (PRENATAL VITAMINS PLUS) 27-1 MG TABS, Take by mouth. Reported on 10/02/2015, Disp: , Rfl:  .  sertraline (ZOLOFT) 50 MG tablet, Take 1 tablet (50 mg total) by mouth 2 (two) times daily., Disp: 60 tablet, Rfl: 5 .  nystatin cream (MYCOSTATIN), Apply 1 application topically 2 (two) times daily. (Patient not taking: Reported on 10/02/2015), Disp: 60  g, Rfl: 1 .  phentermine 37.5 MG capsule, Take 1 capsule (37.5 mg total) by mouth every morning., Disp: 30 capsule, Rfl: 3 .  Prenat w/o A-FeCbn-Meth-FA-DHA (PRENATE MINI) 29-0.6-0.4-350 MG CAPS, Take 1 tablet by mouth daily., Disp: 30 capsule, Rfl: PRN Allergies  Allergen Reactions  . Codeine Itching    Social History  Substance Use Topics  . Smoking status: Former Smoker    Types: Cigarettes    Quit date: 09/22/2011  . Smokeless tobacco: Never Used  . Alcohol Use: No    Family History  Problem Relation Age of Onset  . Cancer Maternal Aunt     Breast  . Cancer Maternal Grandmother     Lung  . Cancer Maternal Aunt     Cervical  . Heart disease  Maternal Grandfather       Review of Systems  Constitutional: negative for fatigue and weight loss Respiratory: negative for cough and wheezing Cardiovascular: negative for chest pain, fatigue and palpitations Gastrointestinal: negative for abdominal pain and change in bowel habits Musculoskeletal:negative for myalgias Neurological: negative for gait problems and tremors Behavioral/Psych: negative for abusive relationship, depression Endocrine: negative for temperature intolerance   Genitourinary:negative for abnormal menstrual periods, genital lesions, hot flashes, sexual problems and vaginal discharge Integument/breast: negative for breast lump, breast tenderness, nipple discharge and skin lesion(s)    Objective:       BP 126/83 mmHg  Pulse 84  Wt 247 lb (112.038 kg)  LMP 12/25/2015 General:   alert  Skin:   no rash or abnormalities  Lungs:   clear to auscultation bilaterally  Heart:   regular rate and rhythm, S1, S2 normal, no murmur, click, rub or gallop  Breasts:   normal without suspicious masses, skin or nipple changes or axillary nodes  Abdomen:  normal findings: no organomegaly, soft, non-tender and no hernia obese  Pelvis:  External genitalia: normal general appearance Urinary system: urethral meatus normal and bladder without fullness, nontender Vaginal: normal without tenderness, induration or masses Cervix: normal appearance Adnexa: normal bimanual exam Uterus: anteverted and non-tender, normal size   Lab Review Urine pregnancy test Labs reviewed yes Radiologic studies reviewed no  50% of 30 min visit spent on counseling and coordination of care.   Assessment:    Healthy female exam.   STD screening exam.  Obesity  H/O AUB  Plan:    Education reviewed: calcium supplements, depression evaluation, low fat, low cholesterol diet, safe sex/STD prevention, self breast exams, skin cancer screening and weight bearing exercise. Contraception: OCP  (estrogen/progesterone) and tubal ligation. Follow up in: 1 year.   Meds ordered this encounter  Medications  . norethindrone-ethinyl estradiol 1/35 (NECON 1/35, 28,) tablet    Sig: Take 1 tablet by mouth daily.    Dispense:  3 Package    Refill:  4  . Prenat w/o A-FeCbn-Meth-FA-DHA (PRENATE MINI) 29-0.6-0.4-350 MG CAPS    Sig: Take 1 tablet by mouth daily.    Dispense:  30 capsule    Refill:  PRN  . phentermine 37.5 MG capsule    Sig: Take 1 capsule (37.5 mg total) by mouth every morning.    Dispense:  30 capsule    Refill:  3   Orders Placed This Encounter  Procedures  . NuSwab Vaginitis Plus (VG+)  . HIV antibody (with reflex)  . Hepatitis B surface antigen  . RPR  . Hepatitis C antibody  . CBC with Differential/Platelet  . Comprehensive metabolic panel  . TSH  . Hemoglobin A1c  .  Referral to Nutrition and Diabetes Services    Referral Priority:  Routine    Referral Type:  Consultation    Referral Reason:  Specialty Services Required    Number of Visits Requested:  1   Possible management options include: Nuva Ring for AUB

## 2016-01-02 ENCOUNTER — Other Ambulatory Visit: Payer: Self-pay | Admitting: Certified Nurse Midwife

## 2016-01-02 ENCOUNTER — Encounter: Payer: Self-pay | Admitting: *Deleted

## 2016-01-02 LAB — CBC WITH DIFFERENTIAL/PLATELET
Basophils Absolute: 0 10*3/uL (ref 0.0–0.2)
Basos: 0 %
EOS (ABSOLUTE): 0.1 10*3/uL (ref 0.0–0.4)
EOS: 2 %
HEMATOCRIT: 36.3 % (ref 34.0–46.6)
Hemoglobin: 11.5 g/dL (ref 11.1–15.9)
IMMATURE GRANS (ABS): 0 10*3/uL (ref 0.0–0.1)
IMMATURE GRANULOCYTES: 0 %
LYMPHS ABS: 1.5 10*3/uL (ref 0.7–3.1)
Lymphs: 32 %
MCH: 25.1 pg — ABNORMAL LOW (ref 26.6–33.0)
MCHC: 31.7 g/dL (ref 31.5–35.7)
MCV: 79 fL (ref 79–97)
MONOCYTES: 6 %
Monocytes Absolute: 0.3 10*3/uL (ref 0.1–0.9)
Neutrophils Absolute: 2.7 10*3/uL (ref 1.4–7.0)
Neutrophils: 60 %
Platelets: 391 10*3/uL — ABNORMAL HIGH (ref 150–379)
RBC: 4.58 x10E6/uL (ref 3.77–5.28)
RDW: 16.3 % — ABNORMAL HIGH (ref 12.3–15.4)
WBC: 4.6 10*3/uL (ref 3.4–10.8)

## 2016-01-02 LAB — COMPREHENSIVE METABOLIC PANEL
ALBUMIN: 4 g/dL (ref 3.5–5.5)
ALT: 9 IU/L (ref 0–32)
AST: 15 IU/L (ref 0–40)
Albumin/Globulin Ratio: 1.4 (ref 1.2–2.2)
Alkaline Phosphatase: 84 IU/L (ref 39–117)
BUN / CREAT RATIO: 16 (ref 9–23)
BUN: 10 mg/dL (ref 6–20)
Bilirubin Total: 0.6 mg/dL (ref 0.0–1.2)
CALCIUM: 8.9 mg/dL (ref 8.7–10.2)
CHLORIDE: 104 mmol/L (ref 96–106)
CO2: 22 mmol/L (ref 18–29)
CREATININE: 0.61 mg/dL (ref 0.57–1.00)
GFR calc Af Amer: 142 mL/min/{1.73_m2} (ref >59)
GFR, EST NON AFRICAN AMERICAN: 123 mL/min/{1.73_m2} (ref >59)
GLOBULIN, TOTAL: 2.8 g/dL (ref 1.5–4.5)
Glucose: 109 mg/dL — ABNORMAL HIGH (ref 65–99)
Potassium: 4.2 mmol/L (ref 3.5–5.2)
Sodium: 141 mmol/L (ref 134–144)
TOTAL PROTEIN: 6.8 g/dL (ref 6.0–8.5)

## 2016-01-02 LAB — HEMOGLOBIN A1C
Est. average glucose Bld gHb Est-mCnc: 126 mg/dL
HEMOGLOBIN A1C: 6 % — AB (ref 4.8–5.6)

## 2016-01-02 LAB — RPR: RPR Ser Ql: NONREACTIVE

## 2016-01-02 LAB — HEPATITIS B SURFACE ANTIGEN: Hepatitis B Surface Ag: NEGATIVE

## 2016-01-02 LAB — TSH: TSH: 0.795 u[IU]/mL (ref 0.450–4.500)

## 2016-01-02 LAB — HIV ANTIBODY (ROUTINE TESTING W REFLEX): HIV SCREEN 4TH GENERATION: NONREACTIVE

## 2016-01-02 LAB — HEPATITIS C ANTIBODY: Hep C Virus Ab: <0.1 s/co ratio (ref 0.0–0.9)

## 2016-01-07 LAB — NUSWAB VAGINITIS PLUS (VG+)
CANDIDA ALBICANS, NAA: NEGATIVE
CANDIDA GLABRATA, NAA: NEGATIVE
Chlamydia trachomatis, NAA: NEGATIVE
Neisseria gonorrhoeae, NAA: NEGATIVE
Trich vag by NAA: NEGATIVE

## 2016-01-07 LAB — PAP IG W/ RFLX HPV ASCU: PAP Smear Comment: 0

## 2016-02-12 ENCOUNTER — Ambulatory Visit: Payer: Medicaid Other | Admitting: Dietician

## 2016-04-08 IMAGING — CR DG KNEE COMPLETE 4+V*L*
4 series · 4 of 4 positions shown · non-contrast
Comparison: None.

CLINICAL DATA: Left posterior knee pain.  No known injury.

EXAM:
LEFT KNEE - COMPLETE 4+ VIEW

[t knee ap left]
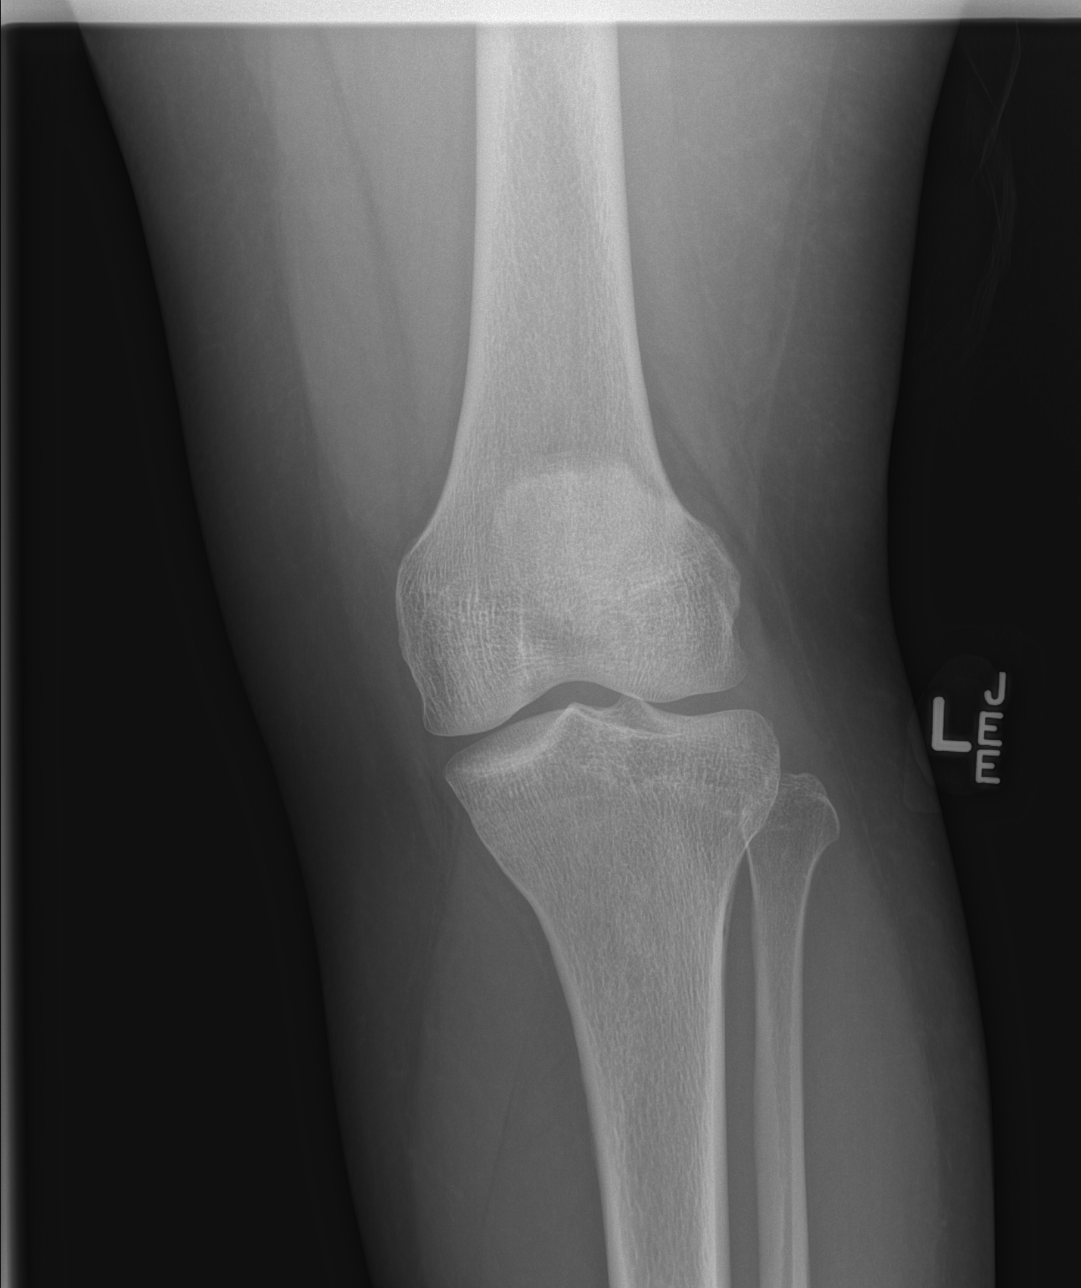

[t knee obl left (1 of 2)]
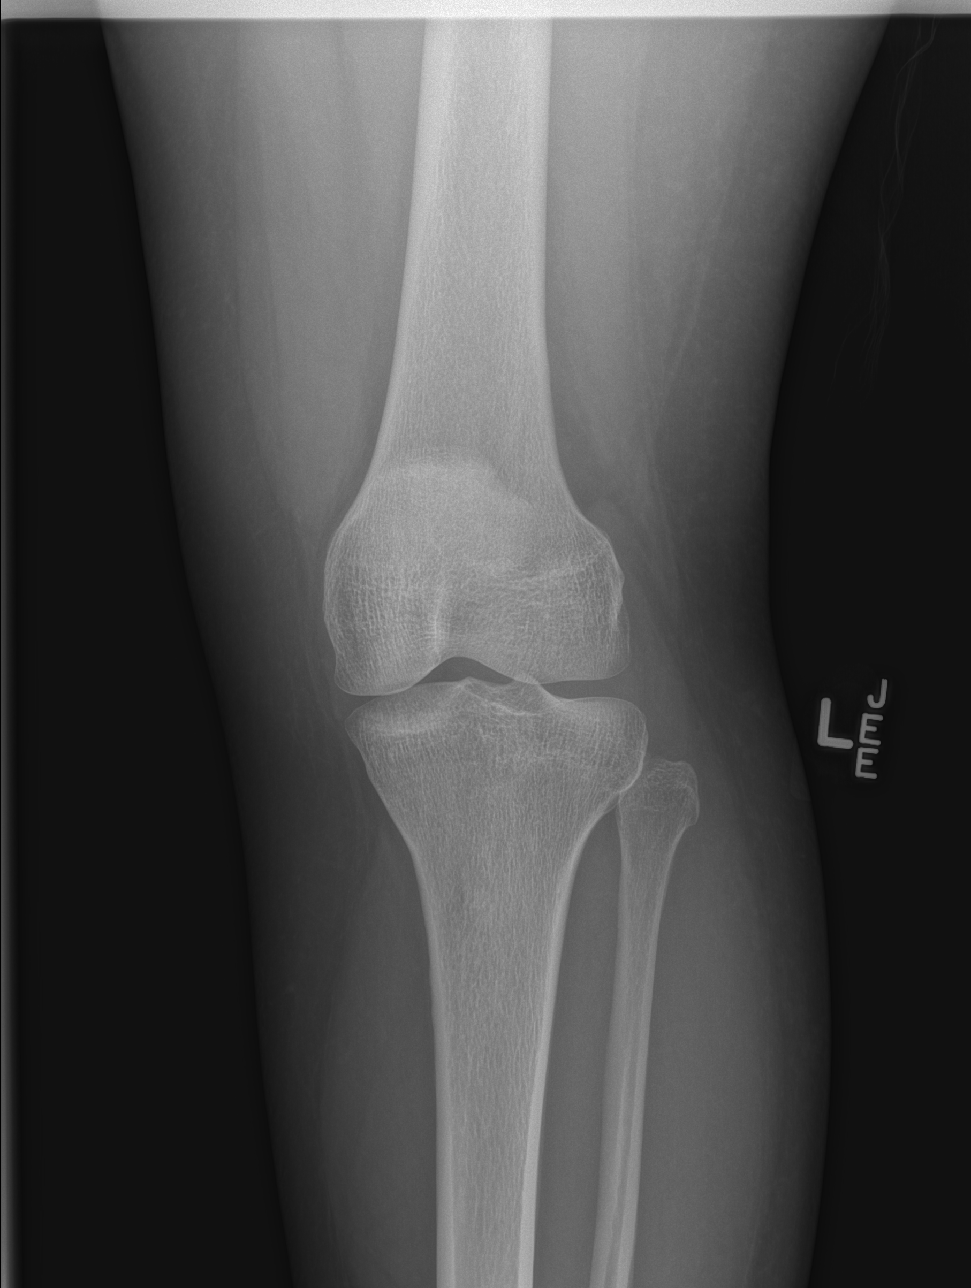

[t knee obl left (2 of 2)]
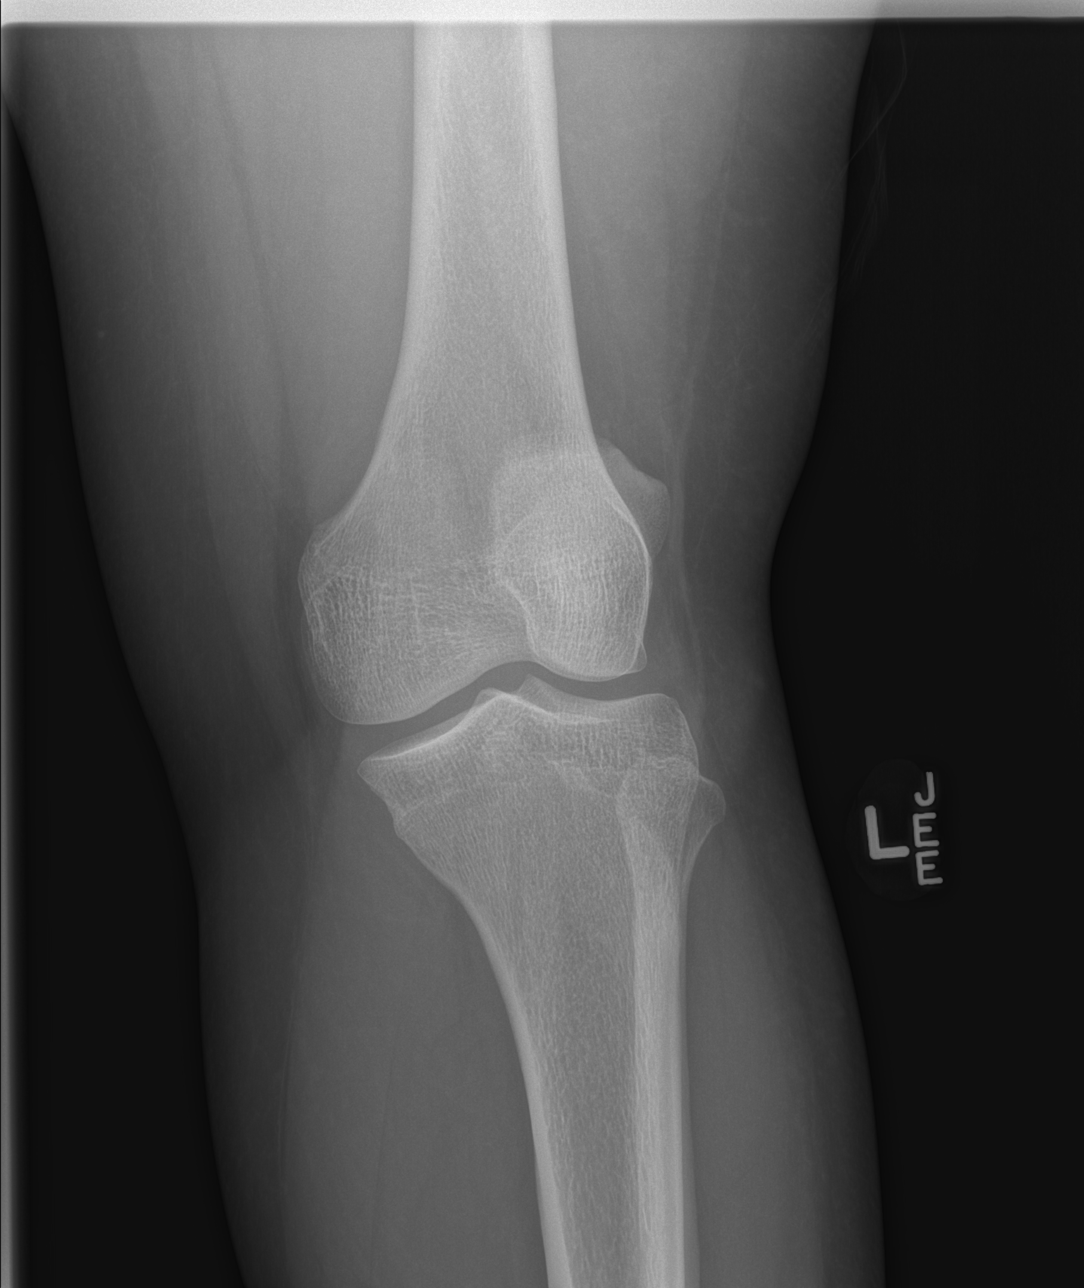

[t knee lat left]
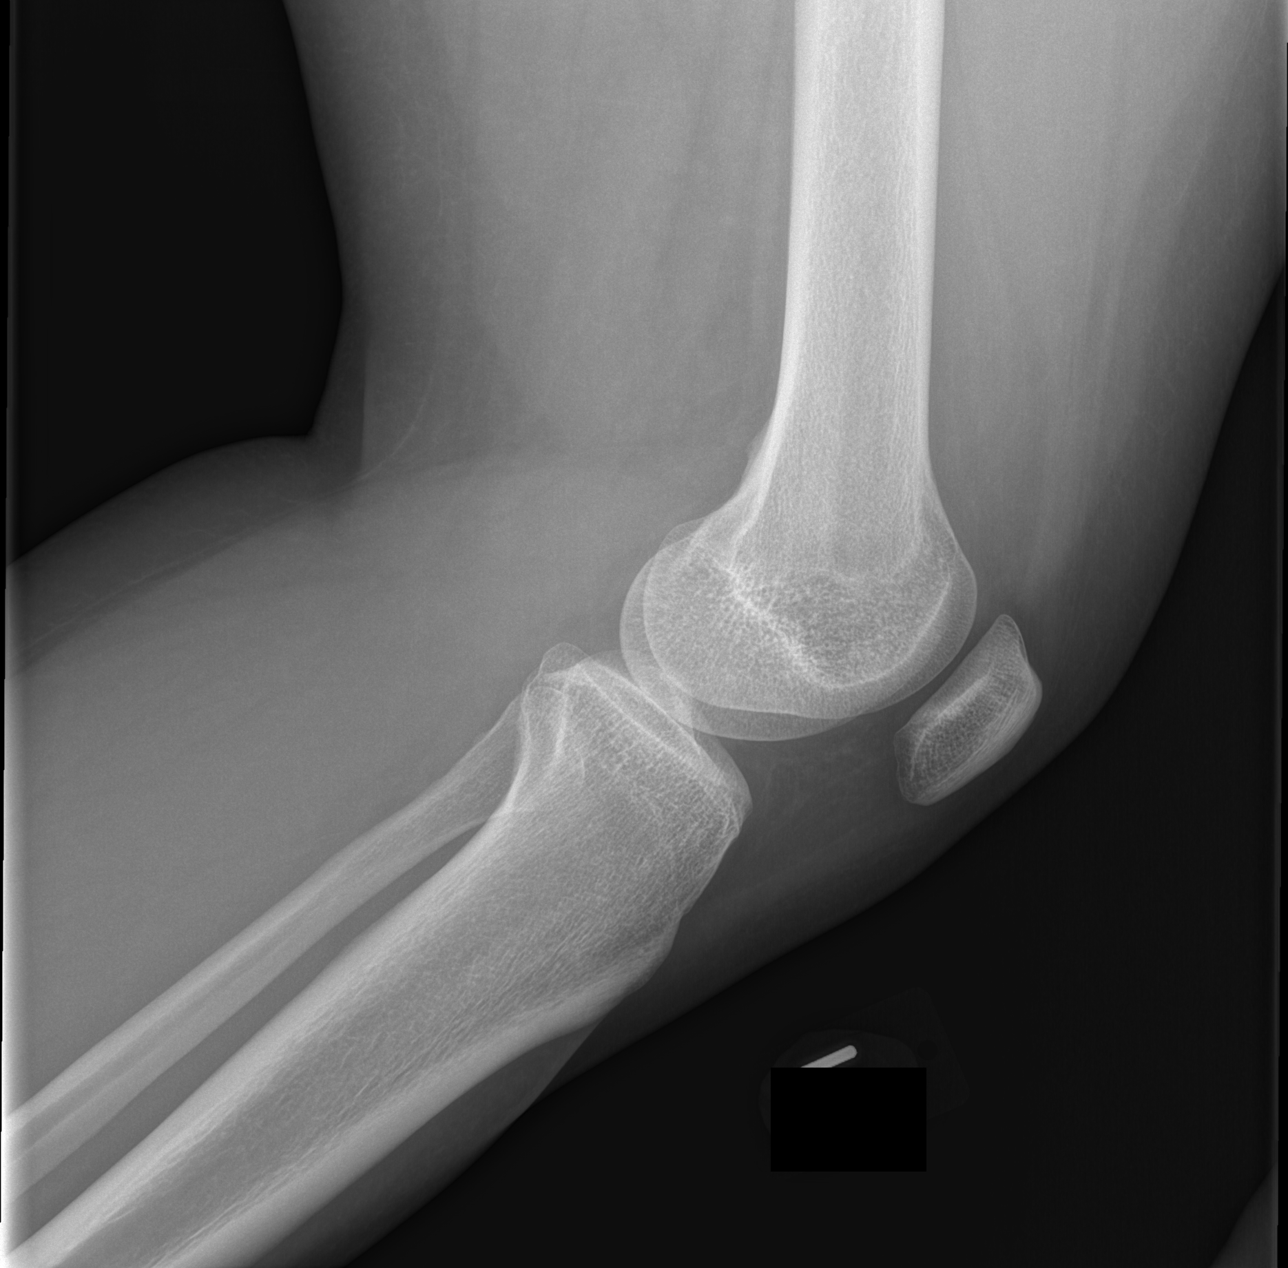

[4 of 4 positions shown; findings below may reference images not displayed]

FINDINGS: There is no evidence of fracture, dislocation, or joint effusion.
There is no evidence of arthropathy or other focal bone abnormality.
Soft tissues are unremarkable.
IMPRESSION: Negative.

## 2016-08-05 ENCOUNTER — Ambulatory Visit: Payer: Medicaid Other | Admitting: Obstetrics

## 2016-09-09 ENCOUNTER — Ambulatory Visit: Payer: Medicaid Other | Admitting: Obstetrics

## 2017-05-21 ENCOUNTER — Emergency Department (HOSPITAL_COMMUNITY): Payer: Self-pay

## 2017-05-21 ENCOUNTER — Emergency Department (HOSPITAL_COMMUNITY)
Admission: EM | Admit: 2017-05-21 | Discharge: 2017-05-21 | Disposition: A | Discharge status: Home / Self Care | Payer: Self-pay | Attending: Emergency Medicine | Admitting: Emergency Medicine

## 2017-05-21 ENCOUNTER — Encounter (HOSPITAL_COMMUNITY): Payer: Self-pay | Admitting: *Deleted

## 2017-05-21 DIAGNOSIS — Y990 Civilian activity done for income or pay: Secondary | ICD-10-CM | POA: Insufficient documentation

## 2017-05-21 DIAGNOSIS — E039 Hypothyroidism, unspecified: Secondary | ICD-10-CM | POA: Insufficient documentation

## 2017-05-21 DIAGNOSIS — W010XXA Fall on same level from slipping, tripping and stumbling without subsequent striking against object, initial encounter: Secondary | ICD-10-CM | POA: Insufficient documentation

## 2017-05-21 DIAGNOSIS — I10 Essential (primary) hypertension: Secondary | ICD-10-CM | POA: Insufficient documentation

## 2017-05-21 DIAGNOSIS — Z87891 Personal history of nicotine dependence: Secondary | ICD-10-CM | POA: Insufficient documentation

## 2017-05-21 DIAGNOSIS — M545 Low back pain, unspecified: Secondary | ICD-10-CM

## 2017-05-21 DIAGNOSIS — Z79899 Other long term (current) drug therapy: Secondary | ICD-10-CM | POA: Insufficient documentation

## 2017-05-21 DIAGNOSIS — W19XXXA Unspecified fall, initial encounter: Secondary | ICD-10-CM

## 2017-05-21 DIAGNOSIS — Y9289 Other specified places as the place of occurrence of the external cause: Secondary | ICD-10-CM | POA: Insufficient documentation

## 2017-05-21 DIAGNOSIS — S93602A Unspecified sprain of left foot, initial encounter: Secondary | ICD-10-CM | POA: Insufficient documentation

## 2017-05-21 DIAGNOSIS — Y9389 Activity, other specified: Secondary | ICD-10-CM | POA: Insufficient documentation

## 2017-05-21 MED ORDER — METHOCARBAMOL 500 MG PO TABS: 500.0000 mg | ORAL_TABLET | Freq: Two times a day (BID) | ORAL | 0 refills | Status: DC | PRN

## 2017-05-21 NOTE — ED Triage Notes (Signed)
Pt reports falling this am at work. Has right foot pain and lower back pain. Ambulatory at triage.

## 2017-05-21 NOTE — ED Provider Notes (Signed)
[facility]-EMERGENCY DEPT Provider Note   CSN: [id number] Arrival date & time: 05/21/17  1112     History   Chief Complaint Chief Complaint  Patient presents with  . Fall  . Back Pain  . Foot Pain    HPI Marisa Quailsshley M Preble is a 31 y.o. female.  Marisa Yu is a 31 y.o. Female who presents to the emergency department after a trip and fall at work today. Patient reports she was trying to get up quickly off the floor when she tripped and injured her left foot and toe as well as falling to the ground. She denies hitting her head or loss of consciousness. She reports pain to her left great toe and along her foot as well as to her bilateral low back. Pain is worse with movement.  She reports she's been using naproxen with some relief of her symptoms. She has been ambulatory since the accident. She denies fevers, numbness, tingling, weakness, head injury, loss of consciousness, loss of bladder control, loss of bowel control, difficulty urinating, abdominal pain, or syncope.   The history is provided by the patient and medical records. No language interpreter was used.  Fall  Pertinent negatives include no abdominal pain.  Back Pain   Pertinent negatives include no fever, no numbness, no abdominal pain and no weakness.  Foot Pain  Pertinent negatives include no abdominal pain.    Past Medical History:  Diagnosis Date  . Anemia   . Boil, arm   . Headache   . Hypertension   . Hypothyroidism   . Tooth ache   . Vaginal Pap smear, abnormal     Patient Active Problem List   Diagnosis Date Noted  . Postpartum examination following cesarean delivery 09/04/2015  . S/P cesarean section 08/21/2015  . Obesity complicating pregnancy in second trimester   . Encounter for fetal anatomic survey   . [redacted] weeks gestation of pregnancy   . Depression affecting pregnancy in first trimester, antepartum 02/08/2015  . Nausea and vomiting during pregnancy prior to [redacted] weeks gestation 02/08/2015  . Anemia  affecting pregnancy 02/08/2015  . Supervision of normal pregnancy in first trimester 01/29/2015  . Carbuncle and furuncle of buttock 01/30/2013  . BV (bacterial vaginosis) 01/30/2013  . Excessive or frequent menstruation 01/30/2013    Past Surgical History:  Procedure Laterality Date  . CESAREAN SECTION  2004  . CESAREAN SECTION  2009  . CESAREAN SECTION WITH BILATERAL TUBAL LIGATION Bilateral 08/21/2015   Procedure: CESAREAN SECTION WITH BILATERAL TUBAL LIGATION;  Surgeon: Brock Badharles A Harper, MD;  Location: [facility];  Service: Obstetrics;  Laterality: Bilateral;  . CYST EXCISION    . WISDOM TOOTH EXTRACTION      OB History    Gravida Para Term Preterm AB Living   3 3 3  0 0 3   SAB TAB Ectopic Multiple Live Births   0 0 0 0 3       Home Medications    Prior to Admission medications   Medication Sig Start Date End Date Taking? Authorizing Provider  methocarbamol (ROBAXIN) 500 MG tablet Take 1 tablet (500 mg total) by mouth 2 (two) times daily as needed for muscle spasms. 05/21/17   Everlene Farrieransie, Shareese Macha, PA-C  norethindrone-ethinyl estradiol 1/35 (NECON 1/35, 28,) tablet Take 1 tablet by mouth daily. 01/01/16   Orvilla Cornwallenney, Rachelle A, CNM  nystatin cream (MYCOSTATIN) Apply 1 application topically 2 (two) times daily. Patient not taking: Reported on 10/02/2015 09/21/15   Brock BadHarper, Charles A, MD  phentermine 37.5 MG capsule Take 1 capsule (37.5 mg total) by mouth every morning. 01/01/16   Denney, Boykin Reaper A, CNM  Prenat w/o A-FeCbn-Meth-FA-DHA (PRENATE MINI) 29-0.6-0.4-350 MG CAPS Take 1 tablet by mouth daily. 01/01/16   Roe Coombs, CNM  Prenatal Vit-Fe Fumarate-FA (PRENATAL VITAMINS PLUS) 27-1 MG TABS Take by mouth. Reported on 10/02/2015    [provider]  sertraline (ZOLOFT) 50 MG tablet Take 1 tablet (50 mg total) by mouth 2 (two) times daily. 11/19/15   Roe Coombs, CNM    Family History Family History  Problem Relation Age of Onset  . Cancer Maternal Aunt         Breast  . Cancer Maternal Aunt        Cervical  . Cancer Maternal Grandmother        Lung  . Heart disease Maternal Grandfather     Social History Social History  Substance Use Topics  . Smoking status: Former Smoker    Types: Cigarettes    Quit date: 09/22/2011  . Smokeless tobacco: Never Used  . Alcohol use No     Allergies   Codeine   Review of Systems Review of Systems  Constitutional: Negative for fever.  Gastrointestinal: Negative for abdominal pain, nausea and vomiting.  Musculoskeletal: Positive for arthralgias and back pain. Negative for gait problem, joint swelling, neck pain and neck stiffness.  Skin: Negative for rash and wound.  Neurological: Negative for dizziness, syncope, weakness, light-headedness and numbness.     Physical Exam Updated Vital Signs BP 134/81 (BP Location: Right Arm)   Pulse 79   Temp 98.2 F (36.8 C) (Oral)   Resp 18   SpO2 99%   Physical Exam  Constitutional: She appears well-developed and well-nourished. No distress.  HENT:  Head: Normocephalic and atraumatic.  Right Ear: External ear normal.  Left Ear: External ear normal.  Eyes: Right eye exhibits no discharge. Left eye exhibits no discharge.  Neck: Normal range of motion. Neck supple.  Cardiovascular: Normal rate, regular rhythm, normal heart sounds and intact distal pulses.   Bilateral dorsalis pedis and posterior tibialis pulses are intact.  Pulmonary/Chest: Effort normal. No respiratory distress.  Abdominal: Soft. There is no tenderness.  Musculoskeletal: Normal range of motion. She exhibits tenderness. She exhibits no edema or deformity.  Mild tenderness along the medial aspect of her left foot along her first toe. No deformity or ecchymosis. No edema noted. Good range of motion of her toes without difficulty. No tenderness noted to her left ankle or knee. No midline neck or back tenderness. Mild tenderness across her bilateral low back musculature. No overlying skin  changes.  Lymphadenopathy:    She has no cervical adenopathy.  Neurological: She is alert. She displays normal reflexes. No sensory deficit. Coordination normal.  Normal gait. Bilateral patellar DTRs are intact. Sensation and strength is intact in her bilateral lower extremities.  Skin: Skin is warm and dry. Capillary refill takes less than 2 seconds. No rash noted. She is not diaphoretic. No erythema. No pallor.  Psychiatric: She has a normal mood and affect. Her behavior is normal.  Nursing note and vitals reviewed.    ED Treatments / Results  Labs (all labs ordered are listed, but only abnormal results are displayed) Labs Reviewed - No data to display  EKG  EKG Interpretation None       Radiology Dg Foot Complete Left  Result Date: 05/21/2017 CLINICAL DATA:  Pt slipped and fell while trying to catch a  child who was running away, medial left foot pain EXAM: LEFT FOOT - COMPLETE 3+ VIEW COMPARISON:  None. FINDINGS: No fracture or dislocation of mid foot or forefoot. The phalanges are normal. The calcaneus is normal. No soft tissue abnormality. IMPRESSION: No fracture or dislocation. Electronically Signed   By: Genevive Bi M.D.   On: 05/21/2017 12:00    Procedures Procedures (including critical care time)  Medications Ordered in ED Medications - No data to display   Initial Impression / Assessment and Plan / ED Course  I have reviewed the triage vital signs and the nursing notes.  Pertinent labs & imaging results that were available during my care of the patient were reviewed by me and considered in my medical decision making (see chart for details).     This  is a 31 y.o. Female who presents to the emergency department after a trip and fall at work today. Patient reports she was trying to get up quickly off the floor when she tripped and injured her left foot and toe as well as falling to the ground. She denies hitting her head or loss of consciousness. She reports  pain to her left great toe and along her foot as well as to her bilateral low back. Pain is worse with movement.  She reports she's been using naproxen with some relief of her symptoms. She has been ambulatory since the accident.  On exam the patient is afebrile nontoxic appearing. She has no focal neurological deficits. No midline back tenderness. She has mild tenderness across her medial left foot. No deformity or ecchymosis noted. X-ray is unremarkable of her left foot. No need for x-ray of her back as patient has no neurological deficits and no midline tenderness. Suspect musculoskeletal tenderness. She started taking naproxen at home. I encouraged her to continue to take this for her pain. I also offered her prescription for Robaxin. She accepts. I discussed precautions with taking a muscle relaxer. I also discussed back exercises. Will provide with a postop shoe for her foot pain and discussed that if she has persistent symptoms she can follow-up with a podiatrist as provided. Return precautions discussed. I advised the patient to follow-up with their primary care provider this week. I advised the patient to return to the emergency department with new or worsening symptoms or new concerns. The patient verbalized understanding and agreement with plan.     Final Clinical Impressions(s) / ED Diagnoses   Final diagnoses:  Fall, initial encounter  Foot sprain, left, initial encounter  Acute bilateral low back pain without sciatica    New Prescriptions New Prescriptions   METHOCARBAMOL (ROBAXIN) 500 MG TABLET    Take 1 tablet (500 mg total) by mouth 2 (two) times daily as needed for muscle spasms.     Everlene Farrier, PA-C 05/21/17 1218    Lorre Nick, MD 05/21/17 [phone]

## 2017-11-22 ENCOUNTER — Ambulatory Visit: Payer: Self-pay | Admitting: Certified Nurse Midwife

## 2017-11-25 ENCOUNTER — Encounter: Payer: Self-pay | Admitting: Certified Nurse Midwife

## 2017-11-25 ENCOUNTER — Ambulatory Visit (INDEPENDENT_AMBULATORY_CARE_PROVIDER_SITE_OTHER): Payer: BLUE CROSS/BLUE SHIELD | Admitting: Certified Nurse Midwife

## 2017-11-25 ENCOUNTER — Other Ambulatory Visit (HOSPITAL_COMMUNITY)
Admission: RE | Admit: 2017-11-25 | Discharge: 2017-11-25 | Disposition: A | Discharge status: Home / Self Care | Payer: BLUE CROSS/BLUE SHIELD | Source: Ambulatory Visit | Attending: Certified Nurse Midwife | Admitting: Certified Nurse Midwife

## 2017-11-25 VITALS — BP 143/95 | HR 78 | Ht 66.0 in | Wt 269.0 lb

## 2017-11-25 DIAGNOSIS — Z113 Encounter for screening for infections with a predominantly sexual mode of transmission: Secondary | ICD-10-CM | POA: Diagnosis present

## 2017-11-25 DIAGNOSIS — Z01419 Encounter for gynecological examination (general) (routine) without abnormal findings: Secondary | ICD-10-CM

## 2017-11-25 DIAGNOSIS — Z803 Family history of malignant neoplasm of breast: Secondary | ICD-10-CM

## 2017-11-25 DIAGNOSIS — R7303 Prediabetes: Secondary | ICD-10-CM

## 2017-11-26 ENCOUNTER — Encounter: Payer: Self-pay | Admitting: Certified Nurse Midwife

## 2017-11-26 LAB — CBC
HEMATOCRIT: 34.9 % (ref 34.0–46.6)
HEMOGLOBIN: 11 g/dL — AB (ref 11.1–15.9)
MCH: 24.9 pg — ABNORMAL LOW (ref 26.6–33.0)
MCHC: 31.5 g/dL (ref 31.5–35.7)
MCV: 79 fL (ref 79–97)
Platelets: 463 10*3/uL — ABNORMAL HIGH (ref 150–379)
RBC: 4.42 x10E6/uL (ref 3.77–5.28)
RDW: 15.8 % — ABNORMAL HIGH (ref 12.3–15.4)
WBC: 7.2 10*3/uL (ref 3.4–10.8)

## 2017-11-26 LAB — COMPREHENSIVE METABOLIC PANEL
ALBUMIN: 4.4 g/dL (ref 3.5–5.5)
ALT: 14 IU/L (ref 0–32)
AST: 19 IU/L (ref 0–40)
Albumin/Globulin Ratio: 1.3 (ref 1.2–2.2)
Alkaline Phosphatase: 81 IU/L (ref 39–117)
BUN/Creatinine Ratio: 19 (ref 9–23)
BUN: 10 mg/dL (ref 6–20)
Bilirubin Total: 0.5 mg/dL (ref 0.0–1.2)
CALCIUM: 9.3 mg/dL (ref 8.7–10.2)
CO2: 23 mmol/L (ref 20–29)
Chloride: 102 mmol/L (ref 96–106)
Creatinine, Ser: 0.53 mg/dL — ABNORMAL LOW (ref 0.57–1.00)
GFR, EST AFRICAN AMERICAN: 146 mL/min/{1.73_m2} (ref >59)
GFR, EST NON AFRICAN AMERICAN: 127 mL/min/{1.73_m2} (ref >59)
GLOBULIN, TOTAL: 3.3 g/dL (ref 1.5–4.5)
Glucose: 89 mg/dL (ref 65–99)
Potassium: 3.9 mmol/L (ref 3.5–5.2)
SODIUM: 139 mmol/L (ref 134–144)
TOTAL PROTEIN: 7.7 g/dL (ref 6.0–8.5)

## 2017-11-26 LAB — HEMOGLOBIN A1C
ESTIMATED AVERAGE GLUCOSE: 120 mg/dL
HEMOGLOBIN A1C: 5.8 % — AB (ref 4.8–5.6)

## 2017-11-26 LAB — TSH: TSH: 1.43 u[IU]/mL (ref 0.450–4.500)

## 2017-11-26 LAB — RPR: RPR Ser Ql: NONREACTIVE

## 2017-11-26 LAB — HEPATITIS C ANTIBODY

## 2017-11-26 LAB — HIV ANTIBODY (ROUTINE TESTING W REFLEX): HIV SCREEN 4TH GENERATION: NONREACTIVE

## 2017-11-26 LAB — HEPATITIS B SURFACE ANTIGEN: Hepatitis B Surface Ag: NEGATIVE

## 2017-11-27 LAB — CERVICOVAGINAL ANCILLARY ONLY
Bacterial vaginitis: NEGATIVE
Candida vaginitis: NEGATIVE
Chlamydia: NEGATIVE
NEISSERIA GONORRHEA: NEGATIVE
TRICH (WINDOWPATH): NEGATIVE

## 2017-11-29 ENCOUNTER — Encounter: Payer: Self-pay | Admitting: Certified Nurse Midwife

## 2017-11-29 DIAGNOSIS — R7303 Prediabetes: Secondary | ICD-10-CM | POA: Insufficient documentation

## 2017-11-29 LAB — CYTOLOGY - PAP
DIAGNOSIS: NEGATIVE
HPV (WINDOPATH): NOT DETECTED

## 2017-11-29 NOTE — Progress Notes (Signed)
Subjective:        Marisa Yu is a 32 y.o. female here for a routine exam.  Current complaints: desires STD screening.  Genetic screening discussed: her cousin has a Runner, broadcasting/film/video that is going to screen her per patient.  Encouraged to f/u with genetic screening.  History of C/S with B[facility].  States that her periods are normal.        Personal health questionnaire:  Is patient [name], have a family history of breast and/or ovarian cancer: yes Is there a family history of uterine cancer diagnosed at age < 76, gastrointestinal cancer, urinary tract cancer, family member who is a Personnel officer syndrome-associated carrier: no Is the patient overweight and hypertensive, family history of diabetes, personal history of gestational diabetes, preeclampsia or PCOS: yes Is patient over 34, have PCOS,  family history of premature CHD under age 71, diabetes, smoke, have hypertension or peripheral artery disease:  yes At any time, has a partner hit, kicked or otherwise hurt or frightened you?: no Over the past 2 weeks, have you felt down, depressed or hopeless?: no Over the past 2 weeks, have you felt little interest or pleasure in doing things?:no   Gynecologic History No LMP recorded. Contraception: tubal ligation Last Pap: 01/01/16. Results were: normal Last mammogram: n/a <40, may need screening mammogram depending on genetics results.   Obstetric History OB History  Gravida Para Term Preterm AB Living  3 3 3  0 0 3  SAB TAB Ectopic Multiple Live Births  0 0 0 0 3    # Outcome Date GA Lbr Len/2nd Weight Sex Delivery Anes PTL Lv  3 Term 08/21/15 [redacted]w[redacted]d  6 lb 14.9 oz (3.145 kg) M CS-LVertical Spinal  LIV  2 Term 02/08/08 [redacted]w[redacted]d  8 lb 0 oz (3.629 kg) F CS-LTranv EPI N LIV  1 Term 11/14/02 [redacted]w[redacted]d  7 lb 13 oz (3.544 kg) M CS-LTranv EPI N LIV     Complications: Fetal Intolerance      Past Medical History:  Diagnosis Date  . Anemia   . Boil, arm   . Headache   . Hypertension   .  Hypothyroidism   . Tooth ache   . Vaginal Pap smear, abnormal     Past Surgical History:  Procedure Laterality Date  . CESAREAN SECTION  2004  . CESAREAN SECTION  2009  . CESAREAN SECTION WITH BILATERAL TUBAL LIGATION Bilateral 08/21/2015   Procedure: CESAREAN SECTION WITH BILATERAL TUBAL LIGATION;  Surgeon: Brock Bad, MD;  Location: [facility];  Service: Obstetrics;  Laterality: Bilateral;  . CYST EXCISION    . WISDOM TOOTH EXTRACTION       Current Outpatient Medications:  .  methocarbamol (ROBAXIN) 500 MG tablet, Take 1 tablet (500 mg total) by mouth 2 (two) times daily as needed for muscle spasms. (Patient not taking: Reported on 11/25/2017), Disp: 20 tablet, Rfl: 0 .  norethindrone-ethinyl estradiol 1/35 (NECON 1/35, 28,) tablet, Take 1 tablet by mouth daily. (Patient not taking: Reported on 11/25/2017), Disp: 3 Package, Rfl: 4 .  sertraline (ZOLOFT) 50 MG tablet, Take 1 tablet (50 mg total) by mouth 2 (two) times daily. (Patient not taking: Reported on 11/25/2017), Disp: 60 tablet, Rfl: 5 Allergies  Allergen Reactions  . Codeine Itching    Social History   Tobacco Use  . Smoking status: Former Smoker    Types: Cigarettes    Last attempt to quit: 09/22/2011    Years since quitting: 6.1  . Smokeless tobacco:  Never Used  Substance Use Topics  . Alcohol use: No    Alcohol/week: 0.0 oz    Family History  Problem Relation Age of Onset  . Cancer Maternal Aunt        Breast  . Cancer Maternal Aunt        Cervical  . Cancer Maternal Grandmother        Lung  . Heart disease Maternal Grandfather       Review of Systems  Constitutional: negative for fatigue and weight loss Respiratory: negative for cough and wheezing Cardiovascular: negative for chest pain, fatigue and palpitations Gastrointestinal: negative for abdominal pain and change in bowel habits Musculoskeletal:negative for myalgias Neurological: negative for gait problems and tremors Behavioral/Psych: negative for  abusive relationship, depression Endocrine: negative for temperature intolerance    Genitourinary:negative for abnormal menstrual periods, genital lesions, hot flashes, sexual problems and vaginal discharge Integument/breast: negative for breast lump, breast tenderness, nipple discharge and skin lesion(s)    Objective:       BP (!) 143/95   Pulse 78   Ht 5\' 6"  (1.676 m)   Wt 269 lb (122 kg)   BMI 43.42 kg/m  General:   alert  Skin:   no rash or abnormalities  Lungs:   clear to auscultation bilaterally  Heart:   regular rate and rhythm, S1, S2 normal, no murmur, click, rub or gallop  Breasts:   normal without suspicious masses, skin or nipple changes or axillary nodes  Abdomen:  normal findings: no organomegaly, soft, non-tender and no hernia  Pelvis:  External genitalia: normal general appearance Urinary system: urethral meatus normal and bladder without fullness, nontender Vaginal: normal without tenderness, induration or masses Cervix: normal appearance Adnexa: normal bimanual exam Uterus: anteverted and non-tender, normal size   Lab Review Urine pregnancy test Labs reviewed yes Radiologic studies reviewed no  50% of 30 min visit spent on counseling and coordination of care.   Assessment & Plan    Healthy female exam.    1. Well woman exam    Elevated blood pressure: Internal medicine encouraged for f/u  - Cytology - PAP - Ambulatory referral to Internal Medicine - CBC - Comprehensive metabolic panel - TSH  2. Morbid obesity (HCC)     - Amb Referral to Bariatric Surgery - Hemoglobin A1c  3. Screen for STD (sexually transmitted disease)    - RPR - Hepatitis C antibody - Hepatitis B surface antigen - HIV antibody - Cervicovaginal ancillary only  4. Family history of breast cancer     Genetics counseling through cousin pending.    Education reviewed: calcium supplements, depression evaluation, low fat, low cholesterol diet, safe sex/STD prevention, self  breast exams, skin cancer screening and weight bearing exercise. Contraception: tubal ligation. Follow up in: 1 year.    Orders Placed This Encounter  Procedures  . CBC  . Comprehensive metabolic panel  . TSH  . RPR  . Hepatitis C antibody  . Hepatitis B surface antigen  . HIV antibody  . Hemoglobin A1c  . Amb Referral to Bariatric Surgery    Referral Priority:   Routine    Referral Type:   Consultation    Referred to Provider:   Wilder GladeBeasley, Caren D, MD    Number of Visits Requested:   1  . Ambulatory referral to Internal Medicine    Referral Priority:   Routine    Referral Type:   Consultation    Referral Reason:   Specialty Services Required  Requested Specialty:   Internal Medicine    Number of Visits Requested:   1    Possible management options include: Genetics referral at [facility] Follow up as needed.

## 2017-11-30 ENCOUNTER — Other Ambulatory Visit: Payer: Self-pay | Admitting: Certified Nurse Midwife

## 2017-12-02 ENCOUNTER — Telehealth: Payer: Self-pay | Admitting: Pediatrics

## 2017-12-02 ENCOUNTER — Encounter: Payer: Self-pay | Admitting: Certified Nurse Midwife

## 2017-12-10 ENCOUNTER — Encounter: Payer: Self-pay | Admitting: Family Medicine

## 2017-12-10 ENCOUNTER — Ambulatory Visit (INDEPENDENT_AMBULATORY_CARE_PROVIDER_SITE_OTHER): Payer: BLUE CROSS/BLUE SHIELD | Admitting: Family Medicine

## 2017-12-10 VITALS — BP 110/80 | HR 84 | Temp 98.6°F | Ht 66.5 in | Wt 265.6 lb

## 2017-12-10 DIAGNOSIS — Z6841 Body Mass Index (BMI) 40.0 and over, adult: Secondary | ICD-10-CM | POA: Diagnosis not present

## 2017-12-10 DIAGNOSIS — Z8679 Personal history of other diseases of the circulatory system: Secondary | ICD-10-CM

## 2017-12-10 DIAGNOSIS — B351 Tinea unguium: Secondary | ICD-10-CM | POA: Diagnosis not present

## 2017-12-10 DIAGNOSIS — Z7689 Persons encountering health services in other specified circumstances: Secondary | ICD-10-CM | POA: Diagnosis not present

## 2017-12-10 NOTE — Patient Instructions (Addendum)
Fungal Nail Infection Fungal nail infection is a common fungal infection of the toenails or fingernails. This condition affects toenails more often than fingernails. More than one nail may be infected. The condition can be passed from person to person (is contagious). What are the causes? This condition is caused by a fungus. Several types of funguses can cause the infection. These funguses are common in moist and warm areas. If your hands or feet come into contact with the fungus, it may get into a crack in your fingernail or toenail and cause the infection. What increases the risk? The following factors may make you more likely to develop this condition:  Being female.  Having diabetes.  Being of older age.  Living with someone who has the fungus.  Walking barefoot in areas where the fungus thrives, such as showers or locker rooms.  Having poor circulation.  Wearing shoes and socks that cause your feet to sweat.  Having athlete's foot.  Having a nail injury or history of a recent nail surgery.  Having psoriasis.  Having a weak body defense system (immune system).  What are the signs or symptoms? Symptoms of this condition include:  A pale spot on the nail.  Thickening of the nail.  A nail that becomes yellow or brown.  A brittle or ragged nail edge.  A crumbling nail.  A nail that has lifted away from the nail bed.  How is this diagnosed? This condition is diagnosed with a physical exam. Your health care provider may take a scraping or clipping from your nail to test for the fungus. How is this treated? Mild infections do not need treatment. If you have significant nail changes, treatment may include:  Oral antifungal medicines. You may need to take the medicine for several weeks or several months, and you may not see the results for a long time. These medicines can cause side effects. Ask your health care provider what problems to watch for.  Antifungal nail polish  and nail cream. These may be used along with oral antifungal medicines.  Laser treatment of the nail.  Surgery to remove the nail. This may be needed for the most severe infections.  Treatment takes a long time, and the infection may come back. Follow these instructions at home: Medicines  Take or apply over-the-counter and prescription medicines only as told by your health care provider.  Ask your health care provider about using over-the-counter mentholated ointment on your nails. Lifestyle   Do not share personal items, such as towels or nail clippers.  Trim your nails often.  Wash and dry your hands and feet every day.  Wear absorbent socks, and change your socks frequently.  Wear shoes that allow air to circulate, such as sandals or canvas tennis shoes. Throw out old shoes.  Wear rubber gloves if you are working with your hands in wet areas.  Do not walk barefoot in shower rooms or locker rooms.  Do not use a nail salon that does not use clean instruments.  Do not use artificial nails. General instructions  Keep all follow-up visits as told by your health care provider. This is important.  Use antifungal foot powder on your feet and in your shoes. Contact a health care provider if: Your infection is not getting better or it is getting worse after several months. This information is not intended to replace advice given to you by your health care provider. Make sure you discuss any questions you have with your health   care provider. Document Released: 09/04/2000 Document Revised: 02/13/2016 Document Reviewed: 03/11/2015 Elsevier Interactive Patient Education  2018 [employer]  How to Take Your Blood Pressure Blood pressure is a measurement of how strongly your blood is pressing against the walls of your arteries. Arteries are blood vessels that carry blood from your heart throughout your body. Your health care provider takes your blood pressure at each office visit.  You can also take your own blood pressure at home with a blood pressure machine. You may need to take your own blood pressure:  To confirm a diagnosis of high blood pressure (hypertension).  To monitor your blood pressure over time.  To make sure your blood pressure medicine is working.  Supplies needed: To take your blood pressure, you will need a blood pressure machine. You can buy a blood pressure machine, or blood pressure monitor, at most drugstores or online. There are several types of home blood pressure monitors. When choosing one, consider the following:  Choose a monitor that has an arm cuff.  Choose a monitor that wraps snugly around your upper arm. You should be able to fit only one finger between your arm and the cuff.  Do not choose a monitor that measures your blood pressure from your wrist or finger.  Your health care provider can suggest a reliable monitor that will meet your needs. How to prepare To get the most accurate reading, avoid the following for 30 minutes before you check your blood pressure:  Drinking caffeine.  Drinking alcohol.  Eating.  Smoking.  Exercising.  Five minutes before you check your blood pressure:  Empty your bladder.  Sit quietly without talking in a dining chair, rather than in a soft couch or armchair.  How to take your blood pressure To check your blood pressure, follow the instructions in the manual that came with your blood pressure monitor. If you have a digital blood pressure monitor, the instructions may be as follows: 1. Sit up straight. 2. Place your feet on the floor. Do not cross your ankles or legs. 3. Rest your left arm at the level of your heart on a table or desk or on the arm of a chair. 4. Pull up your shirt sleeve. 5. Wrap the blood pressure cuff around the upper part of your left arm, 1 inch (2.5 cm) above your elbow. It is best to wrap the cuff around bare skin. 6. Fit the cuff snugly around your arm. You  should be able to place only one finger between the cuff and your arm. 7. Position the cord inside the groove of your elbow. 8. Press the power button. 9. Sit quietly while the cuff inflates and deflates. 10. Read the digital reading on the monitor screen and write it down (record it). 11. Wait 2-3 minutes, then repeat the steps, starting at step 1.  What does my blood pressure reading mean? A blood pressure reading consists of a higher number over a lower number. Ideally, your blood pressure should be below 120/80. The first ("top") number is called the systolic pressure. It is a measure of the pressure in your arteries as your heart beats. The second ("bottom") number is called the diastolic pressure. It is a measure of the pressure in your arteries as the heart relaxes. Blood pressure is classified into four stages. The following are the stages for adults who do not have a short-term serious illness or a chronic condition. Systolic pressure and diastolic pressure are measured in a unit  called mm Hg. Normal  Systolic pressure: below 120.  Diastolic pressure: below 80. Elevated  Systolic pressure: 120-129.  Diastolic pressure: below 80. Hypertension stage 1  Systolic pressure: 130-139.  Diastolic pressure: 80-89. Hypertension stage 2  Systolic pressure: 140 or above.  Diastolic pressure: 90 or above. You can have prehypertension or hypertension even if only the systolic or only the diastolic number in your reading is higher than normal. Follow these instructions at home:  Check your blood pressure as often as recommended by your health care provider.  Take your monitor to the next appointment with your health care provider to make sure: ? That you are using it correctly. ? That it provides accurate readings.  Be sure you understand what your goal blood pressure numbers are.  Tell your health care provider if you are having any side effects from blood pressure  medicine. Contact a health care provider if:  Your blood pressure is consistently high. Get help right away if:  Your systolic blood pressure is higher than 180.  Your diastolic blood pressure is higher than 110. This information is not intended to replace advice given to you by your health care provider. Make sure you discuss any questions you have with your health care provider. Document Released: 02/14/2016 Document Revised: 04/28/2016 Document Reviewed: 02/14/2016 Elsevier Interactive Patient Education  2018 [employer].  DASH Eating Plan DASH stands for "Dietary Approaches to Stop Hypertension." The DASH eating plan is a healthy eating plan that has been shown to reduce high blood pressure (hypertension). It may also reduce your risk for type 2 diabetes, heart disease, and stroke. The DASH eating plan may also help with weight loss. What are tips for following this plan? General guidelines  Avoid eating more than 2,300 mg (milligrams) of salt (sodium) a day. If you have hypertension, you may need to reduce your sodium intake to 1,500 mg a day.  Limit alcohol intake to no more than 1 drink a day for nonpregnant women and 2 drinks a day for men. One drink equals 12 oz of beer, 5 oz of wine, or 1 oz of hard liquor.  Work with your health care provider to maintain a healthy body weight or to lose weight. Ask what an ideal weight is for you.  Get at least 30 minutes of exercise that causes your heart to beat faster (aerobic exercise) most days of the week. Activities may include walking, swimming, or biking.  Work with your health care provider or diet and nutrition specialist (dietitian) to adjust your eating plan to your individual calorie needs. Reading food labels  Check food labels for the amount of sodium per serving. Choose foods with less than 5 percent of the Daily Value of sodium. Generally, foods with less than 300 mg of sodium per serving fit into this eating plan.  To  find whole grains, look for the word "whole" as the first word in the ingredient list. Shopping  Buy products labeled as "low-sodium" or "no salt added."  Buy fresh foods. Avoid canned foods and premade or frozen meals. Cooking  Avoid adding salt when cooking. Use salt-free seasonings or herbs instead of table salt or sea salt. Check with your health care provider or pharmacist before using salt substitutes.  Do not fry foods. Cook foods using healthy methods such as baking, boiling, grilling, and broiling instead.  Cook with heart-healthy oils, such as olive, canola, soybean, or sunflower oil. Meal planning   Eat a balanced diet that includes: ?  5 or more servings of fruits and vegetables each day. At each meal, try to fill half of your plate with fruits and vegetables. ? Up to 6-8 servings of whole grains each day. ? Less than 6 oz of lean meat, poultry, or fish each day. A 3-oz serving of meat is about the same size as a deck of cards. One egg equals 1 oz. ? 2 servings of low-fat dairy each day. ? A serving of nuts, seeds, or beans 5 times each week. ? Heart-healthy fats. Healthy fats called Omega-3 fatty acids are found in foods such as flaxseeds and coldwater fish, like sardines, salmon, and mackerel.  Limit how much you eat of the following: ? Canned or prepackaged foods. ? Food that is high in trans fat, such as fried foods. ? Food that is high in saturated fat, such as fatty meat. ? Sweets, desserts, sugary drinks, and other foods with added sugar. ? Full-fat dairy products.  Do not salt foods before eating.  Try to eat at least 2 vegetarian meals each week.  Eat more home-cooked food and less restaurant, buffet, and fast food.  When eating at a restaurant, ask that your food be prepared with less salt or no salt, if possible. What foods are recommended? The items listed may not be a complete list. Talk with your dietitian about what dietary choices are best for  you. Grains Whole-grain or whole-wheat bread. Whole-grain or whole-wheat pasta. Brown rice. Orpah Cobb. Bulgur. Whole-grain and low-sodium cereals. Pita bread. Low-fat, low-sodium crackers. Whole-wheat flour tortillas. Vegetables Fresh or frozen vegetables (raw, steamed, roasted, or grilled). Low-sodium or reduced-sodium tomato and vegetable juice. Low-sodium or reduced-sodium tomato sauce and tomato paste. Low-sodium or reduced-sodium canned vegetables. Fruits All fresh, dried, or frozen fruit. Canned fruit in natural juice (without added sugar). Meat and other protein foods Skinless chicken or [place]. Ground chicken or [place]. Pork with fat trimmed off. Fish and seafood. Egg whites. Dried beans, peas, or lentils. Unsalted nuts, nut butters, and seeds. Unsalted canned beans. Lean cuts of beef with fat trimmed off. Low-sodium, lean deli meat. Dairy Low-fat (1%) or fat-free (skim) milk. Fat-free, low-fat, or reduced-fat cheeses. Nonfat, low-sodium ricotta or cottage cheese. Low-fat or nonfat yogurt. Low-fat, low-sodium cheese. Fats and oils Soft margarine without trans fats. Vegetable oil. Low-fat, reduced-fat, or light mayonnaise and salad dressings (reduced-sodium). Canola, safflower, olive, soybean, and sunflower oils. Avocado. Seasoning and other foods Herbs. Spices. Seasoning mixes without salt. Unsalted popcorn and pretzels. Fat-free sweets. What foods are not recommended? The items listed may not be a complete list. Talk with your dietitian about what dietary choices are best for you. Grains Baked goods made with fat, such as croissants, muffins, or some breads. Dry pasta or rice meal packs. Vegetables Creamed or fried vegetables. Vegetables in a cheese sauce. Regular canned vegetables (not low-sodium or reduced-sodium). Regular canned tomato sauce and paste (not low-sodium or reduced-sodium). Regular tomato and vegetable juice (not low-sodium or reduced-sodium). Rosita Fire.  Olives. Fruits Canned fruit in a light or heavy syrup. Fried fruit. Fruit in cream or butter sauce. Meat and other protein foods Fatty cuts of meat. Ribs. Fried meat. Tomasa Blase. Sausage. Bologna and other processed lunch meats. Salami. Fatback. Hotdogs. Bratwurst. Salted nuts and seeds. Canned beans with added salt. Canned or smoked fish. Whole eggs or egg yolks. Chicken or [place] with skin. Dairy Whole or 2% milk, cream, and half-and-half. Whole or full-fat cream cheese. Whole-fat or sweetened yogurt. Full-fat cheese. Nondairy creamers. Whipped toppings. Processed  cheese and cheese spreads. Fats and oils Butter. Stick margarine. Lard. Shortening. Ghee. Bacon fat. Tropical oils, such as coconut, palm kernel, or palm oil. Seasoning and other foods Salted popcorn and pretzels. Onion salt, garlic salt, seasoned salt, table salt, and sea salt. Worcestershire sauce. Tartar sauce. Barbecue sauce. Teriyaki sauce. Soy sauce, including reduced-sodium. Steak sauce. Canned and packaged gravies. Fish sauce. Oyster sauce. Cocktail sauce. Horseradish that you find on the shelf. Ketchup. Mustard. Meat flavorings and tenderizers. Bouillon cubes. Hot sauce and Tabasco sauce. Premade or packaged marinades. Premade or packaged taco seasonings. Relishes. Regular salad dressings. Where to find more information:  National Heart, Lung, and Blood Institute: PopSteam.is  American Heart Association: www.heart.org Summary  The DASH eating plan is a healthy eating plan that has been shown to reduce high blood pressure (hypertension). It may also reduce your risk for type 2 diabetes, heart disease, and stroke.  With the DASH eating plan, you should limit salt (sodium) intake to 2,300 mg a day. If you have hypertension, you may need to reduce your sodium intake to 1,500 mg a day.  When on the DASH eating plan, aim to eat more fresh fruits and vegetables, whole grains, lean proteins, low-fat dairy, and heart-healthy  fats.  Work with your health care provider or diet and nutrition specialist (dietitian) to adjust your eating plan to your individual calorie needs. This information is not intended to replace advice given to you by your health care provider. Make sure you discuss any questions you have with your health care provider. Document Released: 08/27/2011 Document Revised: 08/31/2016 Document Reviewed: 08/31/2016 Elsevier Interactive Patient Education  2018 [employer].  Exercising to [employer] Exercising can help you to lose weight. In order to lose weight through exercise, you need to do vigorous-intensity exercise. You can tell that you are exercising with vigorous intensity if you are breathing very hard and fast and cannot hold a conversation while exercising. Moderate-intensity exercise helps to maintain your current weight. You can tell that you are exercising at a moderate level if you have a higher heart rate and faster breathing, but you are still able to hold a conversation. How often should I exercise? Choose an activity that you enjoy and set realistic goals. Your health care provider can help you to make an activity plan that works for you. Exercise regularly as directed by your health care provider. This may include:  Doing resistance training twice each week, such as: ? Push-ups. ? Sit-ups. ? Lifting weights. ? Using resistance bands.  Doing a given intensity of exercise for a given amount of time. Choose from these options: ? 150 minutes of moderate-intensity exercise every week. ? 75 minutes of vigorous-intensity exercise every week. ? A mix of moderate-intensity and vigorous-intensity exercise every week.  Children, pregnant women, people who are out of shape, people who are overweight, and older adults may need to consult a health care provider for individual recommendations. If you have any sort of medical condition, be sure to consult your health care provider before  starting a new exercise program. What are some activities that can help me to lose weight?  Walking at a rate of at least 4.5 miles an hour.  Jogging or running at a rate of 5 miles per hour.  Biking at a rate of at least 10 miles per hour.  Lap swimming.  Roller-skating or in-line skating.  Cross-country skiing.  Vigorous competitive sports, such as football, basketball, and soccer.  Jumping rope.  Aerobic  dancing. How can I be more active in my day-to-day activities?  Use the stairs instead of the elevator.  Take a walk during your lunch break.  If you drive, park your car farther away from work or school.  If you take public transportation, get off one stop early and walk the rest of the way.  Make all of your phone calls while standing up and walking around.  Get up, stretch, and walk around every 30 minutes throughout the day. What guidelines should I follow while exercising?  Do not exercise so much that you hurt yourself, feel dizzy, or get very short of breath.  Consult your health care provider prior to starting a new exercise program.  Wear comfortable clothes and shoes with good support.  Drink plenty of water while you exercise to prevent dehydration or heat stroke. Body water is lost during exercise and must be replaced.  Work out until you breathe faster and your heart beats faster. This information is not intended to replace advice given to you by your health care provider. Make sure you discuss any questions you have with your health care provider. Document Released: 10/10/2010 Document Revised: 02/13/2016 Document Reviewed: 02/08/2014 Elsevier Interactive Patient Education  [employer].

## 2017-12-10 NOTE — Progress Notes (Signed)
Patient presents to clinic today to follow-up on ongoing concerns and to establish care.  SUBJECTIVE: PMH: Pt is a 32 yo with pmh sig for postpartum depression, high blood pressure, migraines.  Patient states she never had a PCP.  She has been followed by OB/GYN.  HTN concerns: -pt endorses elevated bp. -149/110 at OB/Gyn. -pt also endorses increased bp while on a trip to [place].  She went to [facility] ED after checking bp at a pharmacy and having a HA x 2 days. -pt has never been on medication -Patient endorses eating a lot of food.  She states she is trying to cut down on the amount and increase her water intake. -Patient is not exercising.  Desire to lose weight: -Patient is interested in bariatric surgery. -Is not currently doing anything to lose weight as far as exercise. -Patient states she is trying to eat better.  She states she had a salad last night for dinner. -Is trying to increase her intake of water and drinking 4-5 bottles per day.  Toenail concern: -Patient has a pitcure of her right great toe on her phone. -Patient states her toenail has been splitting for several months and is slightly discolored. -Patient endorses having her toes done at the nail shop monthly -Patient states her nail lady told her she had a fungus  History of migraines: -Patient states when she was younger she had frequent migraines. -They have since resolved. -While in the emergency department she was told that she was likely having a migraine, but the patient states that headache did not feel like her typical migraines. -Patient is not taking medication for migraines currently.  Allergies: Codeine-itching  Past surgical history: C-section with BTL Wisdom teeth extraction  Family medical history: Mom-deceased, died at age 32 overdose Dad-alive, diabetes, MI MGF-diabetes, MI MGM-ovarian and breast cancer  Patient found out this morning that her cousin tested positive for breast cancer  gene.  Patient also states another cousin was found to have the breast cancer gene and that she has another family member who was diagnosed with breast cancer at age 32.  Patient has genetic counseling scheduled to see if she has the gene.   Health Maintenance: PAP --11/25/17    Past Medical History:  Diagnosis Date  . Anemia   . Boil, arm   . Headache   . Hypertension   . Hypothyroidism   . Tooth ache   . Vaginal Pap smear, abnormal     Past Surgical History:  Procedure Laterality Date  . CESAREAN SECTION  2004  . CESAREAN SECTION  2009  . CESAREAN SECTION WITH BILATERAL TUBAL LIGATION Bilateral 08/21/2015   Procedure: CESAREAN SECTION WITH BILATERAL TUBAL LIGATION;  Surgeon: Brock Bad, MD;  Location: [facility];  Service: Obstetrics;  Laterality: Bilateral;  . CYST EXCISION    . WISDOM TOOTH EXTRACTION      Current Outpatient Medications on File Prior to Visit  Medication Sig Dispense Refill  . methocarbamol (ROBAXIN) 500 MG tablet Take 1 tablet (500 mg total) by mouth 2 (two) times daily as needed for muscle spasms. 20 tablet 0   No current facility-administered medications on file prior to visit.     Allergies  Allergen Reactions  . Codeine Itching    Family History  Problem Relation Age of Onset  . Cancer Maternal Aunt        Breast  . Cancer Maternal Aunt        Cervical  . Cancer Maternal  Grandmother        Lung  . Heart disease Maternal Grandfather     Social History   Socioeconomic History  . Marital status: Single    Spouse name: Not on file  . Number of children: 2  . Years of education: Not on file  . Highest education level: Not on file  Occupational History  . Occupation: Arts administrator: [employer]  Social Needs  . Financial resource strain: Not on file  . Food insecurity:    Worry: Not on file    Inability: Not on file  . Transportation needs:    Medical: Not on file    Non-medical: Not on file  Tobacco Use  . Smoking  status: Former Smoker    Types: Cigarettes    Last attempt to quit: 09/22/2011    Years since quitting: 6.2  . Smokeless tobacco: Never Used  Substance and Sexual Activity  . Alcohol use: No    Alcohol/week: 0.0 oz  . Drug use: No  . Sexual activity: Not Currently    Partners: Male    Birth control/protection: None  Lifestyle  . Physical activity:    Days per week: Not on file    Minutes per session: Not on file  . Stress: Not on file  Relationships  . Social connections:    Talks on phone: Not on file    Gets together: Not on file    Attends religious service: Not on file    Active member of club or organization: Not on file    Attends meetings of clubs or organizations: Not on file    Relationship status: Not on file  . Intimate partner violence:    Fear of current or ex partner: Not on file    Emotionally abused: Not on file    Physically abused: Not on file    Forced sexual activity: Not on file  Other Topics Concern  . Not on file  Social History Narrative  . Not on file    ROS General: Denies fever, chills, night sweats, changes in weight, changes in appetite  + weight gain HEENT: Denies headaches, ear pain, changes in vision, rhinorrhea, sore throat CV: Denies CP, palpitations, SOB, orthopnea Pulm: Denies SOB, cough, wheezing GI: Denies abdominal pain, nausea, vomiting, diarrhea, constipation GU: Denies dysuria, hematuria, frequency, vaginal discharge Msk: Denies muscle cramps, joint pains Neuro: Denies weakness, numbness, tingling Skin: Denies rashes, bruising  +toe nail splitting Psych: Denies depression, anxiety, hallucinations  BP 110/80 (BP Location: Left Arm, Patient Position: Sitting, Cuff Size: Large)   Pulse 84   Temp 98.6 F (37 C) (Oral)   Ht 5' 6.5" (1.689 m)   Wt 265 lb 9.6 oz (120.5 kg)   LMP 11/14/2017 (Exact Date)   SpO2 96%   BMI 42.23 kg/m   Physical Exam Gen. Pleasant, well developed, well-nourished, in NAD HEENT - [place]/AT, PERRL, no  scleral icterus, no nasal drainage, pharynx without erythema or exudate.  TMs normal bilaterally. Neck: no thyromegaly, no carotid bruits Lungs: no use of accessory muscles, CTAB, no wheezes, rales or rhonchi Cardiovascular: RRR, no r/g/m, no peripheral edema Abdomen: BS present, soft, nontender,nondistended Neuro:  A&Ox3, CN II-XII intact, normal gait Skin:  Warm, dry, intact, no lesions.  Right great toenail with thickened distal edge of nail, yellow in color Psych: normal affect, mood appropriate initially tearful when thinking about her cousin.  Recent Results (from the past 2160 hour(s))  Cytology - PAP  Status: None   Collection Time: 11/25/17 12:00 AM  Result Value Ref Range   Adequacy      Satisfactory for evaluation  endocervical/transformation zone component PRESENT.   Diagnosis      NEGATIVE FOR INTRAEPITHELIAL LESIONS OR MALIGNANCY.   HPV NOT DETECTED     Comment: Normal Reference Range - NOT Detected   Material Submitted CervicoVaginal Pap [ThinPrep Imaged]    CYTOLOGY - PAP PAP RESULT   Cervicovaginal ancillary only     Status: None   Collection Time: 11/25/17 12:00 AM  Result Value Ref Range   Bacterial vaginitis Negative for Bacterial Vaginitis Microorganisms     Comment: Normal Reference Range - Negative   Candida vaginitis Negative for Candida species     Comment: Normal Reference Range - Negative   Chlamydia Negative     Comment: Normal Reference Range - Negative   Neisseria gonorrhea Negative     Comment: Normal Reference Range - Negative   Trichomonas Negative     Comment: Normal Reference Range - Negative  CBC     Status: Abnormal   Collection Time: 11/25/17  4:57 PM  Result Value Ref Range   WBC 7.2 3.4 - 10.8 x10E3/uL   RBC 4.42 3.77 - 5.28 x10E6/uL   Hemoglobin 11.0 (L) 11.1 - 15.9 g/dL   Hematocrit 16.1 09.6 - 46.6 %   MCV 79 79 - 97 fL   MCH 24.9 (L) 26.6 - 33.0 pg   MCHC 31.5 31.5 - 35.7 g/dL   RDW 04.5 (H) 40.9 - 81.1 %   Platelets 463 (H)  150 - 379 x10E3/uL  Comprehensive metabolic panel     Status: Abnormal   Collection Time: 11/25/17  4:57 PM  Result Value Ref Range   Glucose 89 65 - 99 mg/dL   BUN 10 6 - 20 mg/dL   Creatinine, Ser 9.14 (L) 0.57 - 1.00 mg/dL   GFR calc non Af Amer 127 >59 mL/min/1.73   GFR calc Af Amer 146 >59 mL/min/1.73   BUN/Creatinine Ratio 19 9 - 23   Sodium 139 134 - 144 mmol/L   Potassium 3.9 3.5 - 5.2 mmol/L   Chloride 102 96 - 106 mmol/L   CO2 23 20 - 29 mmol/L   Calcium 9.3 8.7 - 10.2 mg/dL   Total Protein 7.7 6.0 - 8.5 g/dL   Albumin 4.4 3.5 - 5.5 g/dL   Globulin, Total 3.3 1.5 - 4.5 g/dL   Albumin/Globulin Ratio 1.3 1.2 - 2.2   Bilirubin Total 0.5 0.0 - 1.2 mg/dL   Alkaline Phosphatase 81 39 - 117 IU/L   AST 19 0 - 40 IU/L   ALT 14 0 - 32 IU/L  TSH     Status: None   Collection Time: 11/25/17  4:57 PM  Result Value Ref Range   TSH 1.430 0.450 - 4.500 uIU/mL  RPR     Status: None   Collection Time: 11/25/17  4:57 PM  Result Value Ref Range   RPR Ser Ql Non Reactive Non Reactive  Hepatitis C antibody     Status: None   Collection Time: 11/25/17  4:57 PM  Result Value Ref Range   Hep C Virus Ab <0.1 0.0 - 0.9 s/co ratio    Comment:                                   Negative:     < 0.8  Indeterminate: 0.8 - 0.9                                   Positive:     > 0.9  The CDC recommends that a positive HCV antibody result  be followed up with a HCV Nucleic Acid Amplification  test ([id number]).   Hepatitis B surface antigen     Status: None   Collection Time: 11/25/17  4:57 PM  Result Value Ref Range   Hepatitis B Surface Ag Negative Negative  HIV antibody     Status: None   Collection Time: 11/25/17  4:57 PM  Result Value Ref Range   HIV Screen 4th Generation wRfx Non Reactive Non Reactive  Hemoglobin A1c     Status: Abnormal   Collection Time: 11/25/17  4:57 PM  Result Value Ref Range   Hgb A1c MFr Bld 5.8 (H) 4.8 - 5.6 %    Comment:           Prediabetes: 5.7 - 6.4          Diabetes: >6.4          Glycemic control for adults with diabetes: <7.0    Est. average glucose Bld gHb Est-mCnc 120 mg/dL    Assessment/Plan: Onychomycosis -Discussed various treatment options for onychomycosis and duration of treatment -Given handout -Patient to try over-the-counter medication times several months. -Patient advised to avoid having her toenails done at local nail shops.  History of high blood pressure -Controlled today 110/80 -Patient to return in 1 week for BP recheck -Patient encouraged to purchase a BP cuff for at home monitoring and keep a log. -Lifestyle modifications encouraged including increasing physical activity, increasing p.o. intake of water, decreasing sodium intake. -Patient given handouts  Encounter to establish care -We reviewed the PMH, PSH, FH, SH, Meds and Allergies. -We provided refills for any medications we will prescribe as needed. -We addressed current concerns per orders and patient instructions. -We have asked for records for pertinent exams, studies, vaccines and notes from previous providers. -We have advised patient to follow up per instructions below.  Class III severe obesity, BMI 42.2 -Patient encouraged to increase physical activity daily.  Discussed walking 20 minutes 3 times per week -Patient encouraged to decrease sodium intake, increase p.o. intake of vegetables and water. -Per chart review patient's OB placed a referral for bariatric surgery.  Follow-up in 1 week for BP recheck.  Consider CPE in the next few months.  Abbe AmsterdamShannon Banks, MD

## 2017-12-16 ENCOUNTER — Ambulatory Visit: Payer: BLUE CROSS/BLUE SHIELD | Admitting: Family Medicine

## 2017-12-20 ENCOUNTER — Ambulatory Visit (INDEPENDENT_AMBULATORY_CARE_PROVIDER_SITE_OTHER): Payer: BLUE CROSS/BLUE SHIELD | Admitting: Family Medicine

## 2017-12-20 ENCOUNTER — Encounter: Payer: Self-pay | Admitting: Family Medicine

## 2017-12-20 VITALS — BP 120/80 | HR 98 | Temp 97.9°F | Wt 261.0 lb

## 2017-12-20 DIAGNOSIS — Z8679 Personal history of other diseases of the circulatory system: Secondary | ICD-10-CM | POA: Diagnosis not present

## 2017-12-20 DIAGNOSIS — G8929 Other chronic pain: Secondary | ICD-10-CM | POA: Diagnosis not present

## 2017-12-20 DIAGNOSIS — M545 Low back pain, unspecified: Secondary | ICD-10-CM

## 2017-12-20 MED ORDER — METHOCARBAMOL 500 MG PO TABS: 500.0000 mg | ORAL_TABLET | Freq: Two times a day (BID) | ORAL | 0 refills | Status: DC | PRN

## 2017-12-20 NOTE — Progress Notes (Signed)
Subjective:    Patient ID: Marisa Yu, female    DOB: February 19, 1986, 32 y.o.   MRN: [id number]  Chief Complaint  Patient presents with  . Follow-up    HPI Patient was seen today for follow-up on blood pressure.  Pt has been trying to eat more salads.  She knows she needs to do more, but has not yet.  Pt does endorse 4 lbs wt loss since last OFV.  Patient did check her blood pressure [facility], she states it was 140/90.  Patient endorses ongoing lower back pain.  She states had back pain off and on since a fall at work in August.  Patient states the pain started off as a sharp pain in her left lower back and become dull.  Lifting and moving in certain positions can make the pain occur. Pt requesting a refill on Robaxin.  Pt states she found a few pills leftover from her initial injury.  She took them recently and they helped.  Pt denies pain or numbness into LEs.  Past Medical History:  Diagnosis Date  . Anemia   . Boil, arm   . Headache   . Hypertension   . Hypothyroidism   . Tooth ache   . Vaginal Pap smear, abnormal     Allergies  Allergen Reactions  . Codeine Itching    ROS General: Denies fever, chills, night sweats, changes in weight, changes in appetite HEENT: Denies headaches, ear pain, changes in vision, rhinorrhea, sore throat CV: Denies CP, palpitations, SOB, orthopnea Pulm: Denies SOB, cough, wheezing GI: Denies abdominal pain, nausea, vomiting, diarrhea, constipation GU: Denies dysuria, hematuria, frequency, vaginal discharge Msk: Denies muscle cramps, joint pains  +low back pain Neuro: Denies weakness, numbness, tingling Skin: Denies rashes, bruising Psych: Denies depression, anxiety, hallucinations     Objective:    Blood pressure 120/80, pulse 98, temperature 97.9 F (36.6 C), temperature source Oral, weight 261 lb (118.4 kg), SpO2 98 %.   Gen. Pleasant, well-nourished, in no distress, normal affect   HEENT: [place]/AT, face symmetric, no  scleral icterus, PERRLA, nares patent without drainage Lungs: no accessory muscle use, CTAB, no wheezes or rales Cardiovascular: RRR, no m/r/g, no peripheral edema Abdomen: BS present, soft, NT/ND Musculoskeletal: No deformities, no cyanosis or clubbing, normal tone.  No TTP of midline spine of paraspinal muscles.  Normal flexion and extension of spine. Neuro:  A&Ox3, CN II-XII intact, normal gait   Wt Readings from Last 3 Encounters:  12/20/17 261 lb (118.4 kg)  12/10/17 265 lb 9.6 oz (120.5 kg)  11/25/17 269 lb (122 kg)    Lab Results  Component Value Date   WBC 7.2 11/25/2017   HGB 11.0 (L) 11/25/2017   HCT 34.9 11/25/2017   PLT 463 (H) 11/25/2017   GLUCOSE 89 11/25/2017   ALT 14 11/25/2017   AST 19 11/25/2017   NA 139 11/25/2017   K 3.9 11/25/2017   CL 102 11/25/2017   CREATININE 0.53 (L) 11/25/2017   BUN 10 11/25/2017   CO2 23 11/25/2017   TSH 1.430 11/25/2017   HGBA1C 5.8 (H) 11/25/2017    Assessment/Plan:  History of high blood pressure -controlled -continue lifestyle modifications -will continue to reassess at each visit.  If becomes elevated will start medicine  Chronic left-sided low back pain without sciatica  -pt given back exercises -if continues will discuss imaging. - Plan: methocarbamol (ROBAXIN) 500 MG tablet  F/u in the next few months for bp, sooner if needed.  Abbe AmsterdamShannon Banks,  MD

## 2017-12-20 NOTE — Patient Instructions (Signed)
Chronic Back Pain When back pain lasts longer than 3 months, it is called chronic back pain.The cause of your back pain may not be known. Some common causes include:  Wear and tear (degenerative disease) of the bones, ligaments, or disks in your back.  Inflammation and stiffness in your back (arthritis).  People who have chronic back pain often go through certain periods in which the pain is more intense (flare-ups). Many people can learn to manage the pain with home care. Follow these instructions at home: Pay attention to any changes in your symptoms. Take these actions to help with your pain: Activity  Avoid bending and activities that make the problem worse.  Do not sit or stand in one place for long periods of time.  Take brief periods of rest throughout the day. This will reduce your pain. Resting in a lying or standing position is usually better than sitting to rest.  When you are resting for longer periods, mix in some mild activity or stretching between periods of rest. This will help to prevent stiffness and pain.  Get regular exercise. Ask your health care provider what activities are safe for you.  Do not lift anything that is heavier than 10 lb (4.5 kg). Always use proper lifting technique, which includes: ? Bending your knees. ? Keeping the load close to your body. ? Avoiding twisting. Managing pain  If directed, apply ice to the painful area. Your health care provider may recommend applying ice during the first 24-48 hours after a flare-up begins. ? Put ice in a plastic bag. ? Place a towel between your skin and the bag. ? Leave the ice on for 20 minutes, 2-3 times per day.  After icing, apply heat to the affected area as often as told by your health care provider. Use the heat source that your health care provider recommends, such as a moist heat pack or a heating pad. ? Place a towel between your skin and the heat source. ? Leave the heat on for 20-30  minutes. ? Remove the heat if your skin turns bright red. This is especially important if you are unable to feel pain, heat, or cold. You may have a greater risk of getting burned.  Try soaking in a warm tub.  Take over-the-counter and prescription medicines only as told by your health care provider.  Keep all follow-up visits as told by your health care provider. This is important. Contact a health care provider if:  You have pain that is not relieved with rest or medicine. Get help right away if:  You have weakness or numbness in one or both of your legs or feet.  You have trouble controlling your bladder or your bowels.  You have nausea or vomiting.  You have pain in your abdomen.  You have shortness of breath or you faint. This information is not intended to replace advice given to you by your health care provider. Make sure you discuss any questions you have with your health care provider. Document Released: 10/15/2004 Document Revised: 01/16/2016 Document Reviewed: 02/25/2015 Elsevier Interactive Patient Education  2018 [employer].  Back Exercises If you have pain in your back, do these exercises 2-3 times each day or as told by your doctor. When the pain goes away, do the exercises once each day, but repeat the steps more times for each exercise (do more repetitions). If you do not have pain in your back, do these exercises once each day or as told by  your doctor. Exercises Single Knee to Chest  Do these steps 3-5 times in a row for each leg: 1. Lie on your back on a firm bed or the floor with your legs stretched out. 2. Bring one knee to your chest. 3. Hold your knee to your chest by grabbing your knee or thigh. 4. Pull on your knee until you feel a gentle stretch in your lower back. 5. Keep doing the stretch for 10-30 seconds. 6. Slowly let go of your leg and straighten it.  Pelvic Tilt  Do these steps 5-10 times in a row: 1. Lie on your back on a firm bed or the  floor with your legs stretched out. 2. Bend your knees so they point up to the ceiling. Your feet should be flat on the floor. 3. Tighten your lower belly (abdomen) muscles to press your lower back against the floor. This will make your tailbone point up to the ceiling instead of pointing down to your feet or the floor. 4. Stay in this position for 5-10 seconds while you gently tighten your muscles and breathe evenly.  Cat-Cow  Do these steps until your lower back bends more easily: 1. Get on your hands and knees on a firm surface. Keep your hands under your shoulders, and keep your knees under your hips. You may put padding under your knees. 2. Let your head hang down, and make your tailbone point down to the floor so your lower back is round like the back of a cat. 3. Stay in this position for 5 seconds. 4. Slowly lift your head and make your tailbone point up to the ceiling so your back hangs low (sags) like the back of a cow. 5. Stay in this position for 5 seconds.  Press-Ups  Do these steps 5-10 times in a row: 1. Lie on your belly (face-down) on the floor. 2. Place your hands near your head, about shoulder-width apart. 3. While you keep your back relaxed and keep your hips on the floor, slowly straighten your arms to raise the top half of your body and lift your shoulders. Do not use your back muscles. To make yourself more comfortable, you may change where you place your hands. 4. Stay in this position for 5 seconds. 5. Slowly return to lying flat on the floor.  Bridges  Do these steps 10 times in a row: 1. Lie on your back on a firm surface. 2. Bend your knees so they point up to the ceiling. Your feet should be flat on the floor. 3. Tighten your butt muscles and lift your butt off of the floor until your waist is almost as high as your knees. If you do not feel the muscles working in your butt and the back of your thighs, slide your feet 1-2 inches farther away from your  butt. 4. Stay in this position for 3-5 seconds. 5. Slowly lower your butt to the floor, and let your butt muscles relax.  If this exercise is too easy, try doing it with your arms crossed over your chest. Belly Crunches  Do these steps 5-10 times in a row: 1. Lie on your back on a firm bed or the floor with your legs stretched out. 2. Bend your knees so they point up to the ceiling. Your feet should be flat on the floor. 3. Cross your arms over your chest. 4. Tip your chin a little bit toward your chest but do not bend your neck. 5. Tighten  your belly muscles and slowly raise your chest just enough to lift your shoulder blades a tiny bit off of the floor. 6. Slowly lower your chest and your head to the floor.  Back Lifts Do these steps 5-10 times in a row: 1. Lie on your belly (face-down) with your arms at your sides, and rest your forehead on the floor. 2. Tighten the muscles in your legs and your butt. 3. Slowly lift your chest off of the floor while you keep your hips on the floor. Keep the back of your head in line with the curve in your back. Look at the floor while you do this. 4. Stay in this position for 3-5 seconds. 5. Slowly lower your chest and your face to the floor.  Contact a doctor if:  Your back pain gets a lot worse when you do an exercise.  Your back pain does not lessen 2 hours after you exercise. If you have any of these problems, stop doing the exercises. Do not do them again unless your doctor says it is okay. Get help right away if:  You have sudden, very bad back pain. If this happens, stop doing the exercises. Do not do them again unless your doctor says it is okay. This information is not intended to replace advice given to you by your health care provider. Make sure you discuss any questions you have with your health care provider. Document Released: 10/10/2010 Document Revised: 02/13/2016 Document Reviewed: 11/01/2014 Elsevier Interactive Patient  Education  2018 [employer].  Preventing Hypertension Hypertension, commonly called high blood pressure, is when the force of blood pumping through the arteries is too strong. Arteries are blood vessels that carry blood from the heart throughout the body. Over time, hypertension can damage the arteries and decrease blood flow to important parts of the body, including the brain, heart, and kidneys. Often, hypertension does not cause symptoms until blood pressure is very high. For this reason, it is important to have your blood pressure checked on a regular basis. Hypertension can often be prevented with diet and lifestyle changes. If you already have hypertension, you can control it with diet and lifestyle changes, as well as medicine. What nutrition changes can be made? Maintain a healthy diet. This includes:  Eating less salt (sodium). Ask your health care provider how much sodium is safe for you to have. The general recommendation is to consume less than 1 tsp (2,300 mg) of sodium a day. ? Do not add salt to your food. ? Choose low-sodium options when grocery shopping and eating out.  Limiting fats in your diet. You can do this by eating low-fat or fat-free dairy products and by eating less red meat.  Eating more fruits, vegetables, and whole grains. Make a goal to eat: ? 1-2 cups of fresh fruits and vegetables each day. ? 3-4 servings of whole grains each day.  Avoiding foods and beverages that have added sugars.  Eating fish that contain healthy fats (omega-3 fatty acids), such as mackerel or salmon.  If you need help putting together a healthy eating plan, try the DASH diet. This diet is high in fruits, vegetables, and whole grains. It is low in sodium, red meat, and added sugars. DASH stands for Dietary Approaches to Stop Hypertension. What lifestyle changes can be made?  Lose weight if you are overweight. Losing just 3?5% of your body weight can help prevent or control  hypertension. ? For example, if your present weight is 200 lb (  91 kg), a loss of 3-5% of your weight means losing 6-10 lb (2.7-4.5 kg). ? Ask your health care provider to help you with a diet and exercise plan to safely lose weight.  Get enough exercise. Do at least 150 minutes of moderate-intensity exercise each week. ? You could do this in short exercise sessions several times a day, or you could do longer exercise sessions a few times a week. For example, you could take a brisk 10-minute walk or bike ride, 3 times a day, for 5 days a week.  Find ways to reduce stress, such as exercising, meditating, listening to music, or taking a yoga class. If you need help reducing stress, ask your health care provider.  Do not smoke. This includes e-cigarettes. Chemicals in tobacco and nicotine products raise your blood pressure each time you smoke. If you need help quitting, ask your health care provider.  Avoid alcohol. If you drink alcohol, limit alcohol intake to no more than 1 drink a day for nonpregnant women and 2 drinks a day for men. One drink equals 12 oz of beer, 5 oz of wine, or 1 oz of hard liquor. Why are these changes important? Diet and lifestyle changes can help you prevent hypertension, and they may make you feel better overall and improve your quality of life. If you have hypertension, making these changes will help you control it and help prevent major complications, such as:  Hardening and narrowing of arteries that supply blood to: ? Your heart. This can cause a heart attack. ? Your brain. This can cause a stroke. ? Your kidneys. This can cause kidney failure.  Stress on your heart muscle, which can cause heart failure.  What can I do to lower my risk?  Work with your health care provider to make a hypertension prevention plan that works for you. Follow your plan and keep all follow-up visits as told by your health care provider.  Learn how to check your blood pressure at home.  Make sure that you know your personal target blood pressure, as told by your health care provider. How is this treated? In addition to diet and lifestyle changes, your health care provider may recommend medicines to help lower your blood pressure. You may need to try a few different medicines to find what works best for you. You also may need to take more than one medicine. Take over-the-counter and prescription medicines only as told by your health care provider. Where to find support: Your health care provider can help you prevent hypertension and help you keep your blood pressure at a healthy level. Your local hospital or your community may also provide support services and prevention programs. The American Heart Association offers an online support network at: https://www.lee.net/ Where to find more information: Learn more about hypertension from:  National Heart, Lung, and Blood Institute: https://www.peterson.org/  Centers for Disease Control and Prevention: AboutHD.co.nz  American Academy of Family Physicians: http://familydoctor.org/familydoctor/en/diseases-conditions/high-blood-pressure.printerview.all.html  Learn more about the DASH diet from:  National Heart, Lung, and Blood Institute: WedMap.it  Contact a health care provider if:  You think you are having a reaction to medicines you have taken.  You have recurrent headaches or feel dizzy.  You have swelling in your ankles.  You have trouble with your vision. Summary  Hypertension often does not cause any symptoms until blood pressure is very high. It is important to get your blood pressure checked regularly.  Diet and lifestyle changes are the most important steps in  preventing hypertension.  By keeping your blood pressure in a healthy range, you can prevent complications like heart attack, heart failure, stroke,  and kidney failure.  Work with your health care provider to make a hypertension prevention plan that works for you. This information is not intended to replace advice given to you by your health care provider. Make sure you discuss any questions you have with your health care provider. Document Released: 09/22/2015 Document Revised: 05/18/2016 Document Reviewed: 05/18/2016 Elsevier Interactive Patient Education  [employer]2018 [employer]

## 2018-01-18 ENCOUNTER — Encounter: Payer: Self-pay | Admitting: Genetics

## 2018-01-18 ENCOUNTER — Inpatient Hospital Stay: Payer: BLUE CROSS/BLUE SHIELD

## 2018-01-18 ENCOUNTER — Inpatient Hospital Stay: Payer: BLUE CROSS/BLUE SHIELD | Attending: Genetic Counselor | Admitting: Genetics

## 2018-01-18 DIAGNOSIS — Z8481 Family history of carrier of genetic disease: Secondary | ICD-10-CM

## 2018-01-18 DIAGNOSIS — Z803 Family history of malignant neoplasm of breast: Secondary | ICD-10-CM | POA: Insufficient documentation

## 2018-01-18 DIAGNOSIS — Z1379 Encounter for other screening for genetic and chromosomal anomalies: Secondary | ICD-10-CM

## 2018-01-18 HISTORY — DX: Family history of carrier of genetic disease: Z84.81

## 2018-01-18 NOTE — Progress Notes (Signed)
REFERRING PROVIDER: Self/ Cousin PRIMARY PROVIDER:  Billie Ruddy, MD  PRIMARY REASON FOR VISIT:  1. Family history of breast cancer   2. Family history of BRCA1 gene positive     HISTORY OF PRESENT ILLNESS:   Marisa Yu, a 32 y.o. female, was seen for a [facility] consultation due to a BRCA1 pathogenic variant being identified in her maternal cousins BRCA1, c.2475del (p.Asp825Glufs*21).  Her cousins also report a maternal half-aunt who reportedly has a BRCA2 pathogenic variant.    HORMONAL RISK FACTORS:  Menarche was at age 77.  First live birth at age 31.  OCP use for approximately 8 years. On/off Ovaries intact: yes.  Hysterectomy: no.  Menopausal status: premenopausal.  HRT use: 0 years. Colonoscopy: no; not examined. Mammogram within the last year: no. Number of breast biopsies: 0.  Past Medical History:  Diagnosis Date  . Anemia   . Boil, arm   . Family history of BRCA1 gene positive 01/18/2018  . Family history of breast cancer   . Headache   . Hypertension   . Hypothyroidism   . Tooth ache   . Vaginal Pap smear, abnormal     Past Surgical History:  Procedure Laterality Date  . CESAREAN SECTION  2004  . CESAREAN SECTION  2009  . CESAREAN SECTION WITH BILATERAL TUBAL LIGATION Bilateral 08/21/2015   Procedure: CESAREAN SECTION WITH BILATERAL TUBAL LIGATION;  Surgeon: Shelly Bombard, MD;  Location: [facility];  Service: Obstetrics;  Laterality: Bilateral;  . CYST EXCISION    . WISDOM TOOTH EXTRACTION      Social History   Socioeconomic History  . Marital status: Single    Spouse name: Not on file  . Number of children: 2  . Years of education: Not on file  . Highest education level: Not on file  Occupational History  . Occupation: Catering manager: [employer]  . Financial resource strain: Not on file  . Food insecurity:    Worry: Not on file    Inability: Not on file  . Transportation needs:    Medical: Not on file     Non-medical: Not on file  Tobacco Use  . Smoking status: Former Smoker    Types: Cigarettes    Last attempt to quit: 09/22/2011    Years since quitting: 6.3  . Smokeless tobacco: Never Used  Substance and Sexual Activity  . Alcohol use: No    Alcohol/week: 0.0 oz  . Drug use: No  . Sexual activity: Not Currently    Partners: Male    Birth control/protection: None  Lifestyle  . Physical activity:    Days per week: Not on file    Minutes per session: Not on file  . Stress: Not on file  Relationships  . Social connections:    Talks on phone: Not on file    Gets together: Not on file    Attends religious service: Not on file    Active member of club or organization: Not on file    Attends meetings of clubs or organizations: Not on file    Relationship status: Not on file  Other Topics Concern  . Not on file  Social History Narrative  . Not on file     FAMILY HISTORY:  We obtained a detailed, 4-generation family history.  Significant diagnoses are listed below: Family History  Problem Relation Age of Onset  . Breast cancer Maternal Aunt  dx 40's  . Breast cancer Maternal Aunt        dx 40's  . Breast cancer Maternal Grandmother   . Heart disease Maternal Grandfather   . Breast cancer Cousin 39       BRCA1 +  . Other Cousin        BRCA1 +   Marisa Yu has 2 sons and 1 daughter with no history of cancer.  Marisa Yu has 2 paternal half-sisters and 3 paternal half brothers. All of these half-siblings are younger than Marisa Yu. No history of cancer in these relatives.    Marisa Yu father: is 70 with no history of cancer.   Paternal Aunts/Uncles: 1 paternal uncle in his 104's with no history of cancer. 1 paternal aunt in her 19's with no history of cancer.  Paternal cousins: no history of cancer.  Paternal grandfather: died young, no cancer Paternal grandmother:alive in 58's with no history of cancer.   Marisa Yu mother: died at 110 due to a drug overdose.   Maternal Aunts/Uncles:  -2 maternal aunts both died of breast cancer dx in their 27's.   -1 maternal half- aunt (through [facility]) reportedly has a BRCA2 mutation- this is reported through Marisa Yu cousins, Marisa Yu did not know about this finding in the family.  -2 maternal uncles with no history of cancer.  Maternal cousins: 1 maternal uncle had 2 daughters who are both BRCA1 positive c.2475del (p.Asp825Glufs*21). One of these cousins was recently diagnosed with breast cancer at 23.   Maternal grandfather: heart disease.  Maternal grandmother:deceased, breast cancer  Patient's maternal ancestors are of African American descent, and paternal ancestors are of African American descent. There is no reported [name] ancestry. There is no known consanguinity.  GENETIC COUNSELING ASSESSMENT: Marisa Yu is a 32 y.o. female with a family history of Hereditary Breast and Ovarian Cancer Syndrome (BRCA1 pathogenic variant).  There is also a BRCA2 pathogenic variant in a half-aunt reported by one of the patient's cousins.  We, therefore, discussed and recommended the following at today's visit.   BRCA1: Women who have aBRCAmutationat asignificantlyincreased risk to develop breast and ovarian cancer.Individuals with BRCA37mtations are also at a somewhat elevated risk to develop pancreatic cancer an melanoma.  We discussedwithMs.Millerthe optionswomen have when they are found to have a BRCA1pathogenic variant. A woman can have aprophylactic bilateral mastectomyas well aspursueincreased/high risk breast screening that includesyearly mammograms, yearly breast MRI, twice-yearly clinical breast exams through a high-risk clinic, and monthly self-breast exams.  To reduce therisk for ovarian cancer,it is recommended women with BRCA1pathogenic variantshave a prophylactic bilateral salpingo-oophorectomy whenchildbearingis completed, if planned.For Individuals with  BRCA166mations this is recommended to occur between the ages of 3555-40We discussed that screening with CA-125 blood tests and transvaginal ultrasounds can be done twice per year. However, these tests have not been shown to detect ovarian cancer at an early stage.  DISCUSSION: We reviewed the characteristics, features and inheritance patterns of hereditary cancer syndromes. We also discussed genetic testing, including the appropriate family members to test, the process of testing, insurance coverage and turn-around-time for results. We discussed the implications of a negative, positive and/or variant of uncertain significant result. We recommended Ms. MiSabra Heckursue genetic testing for the Multi-Cancer gene panel.   The Multi-Cancer Panel offered by [facility] includes sequencing and/or deletion duplication testing of the following 83 genes: ALK, APC, ATM, AXIN2,BAP1,  BARD1, BLM, BMPR1A, BRCA1, BRCA2, BRIP1, CASR, CDC73, CDH1, CDK4, CDKN1B, CDKN1C, CDKN2A (p14ARF), CDKN2A (p16INK4a), CEBPA,  CHEK2, CTNNA1, DICER1, DIS3L2, EGFR (c.2369C>T, p.Thr790Met variant only), EPCAM (Deletion/duplication testing only), FH, FLCN, GATA2, GPC3, GREM1 (Promoter region deletion/duplication testing only), HOXB13 (c.251G>A, p.Gly84Glu), HRAS, KIT, MAX, MEN1, MET, MITF (c.952G>A, p.Glu318Lys variant only), MLH1, MSH2, MSH3, MSH6, MUTYH, NBN, NF1, NF2, NTHL1, PALB2, PDGFRA, PHOX2B, PMS2, POLD1, POLE, POT1, PRKAR1A, PTCH1, PTEN, RAD50, RAD51C, RAD51D, RB1, RECQL4, RET, RUNX1, SDHAF2, SDHA (sequence changes only), SDHB, SDHC, SDHD, SMAD4, SMARCA4, SMARCB1, SMARCE1, STK11, SUFU, TERC, TERT, TMEM127, TP53, TSC1, TSC2, VHL, WRN and WT1.   We discussed that if she is found to have a mutation in one of these genes, it may impact future medical management recommendations such as increased cancer screenings and consideration of risk reducing surgeries.  A positive result could also have implications for the patient's family members.  A  Negative result would mean she did not inherit the familial BRCA1 pathogenic variant.  It would also mean we did not identify a pathogenic variant in any of the other genes analyzed on this panel.  Testing will provide a diagnostic result regarding the familial BRCA1 mutation.  However, negative genetic testing cannot definitively rule out any hereditary predisposition to cancer.  There could be mutations that are undetectable by current technology, or in genes not yet tested or identified to increase cancer risk.    We discussed the potential to find a Variant of Uncertain Significance or VUS.  These are variants that have not yet been identified as pathogenic or benign, and it is unknown if this variant is associated with increased cancer risk or if this is a normal finding.  Most VUS's are reclassified to benign or likely benign.   It should not be used to make medical management decisions. With time, we suspect the lab will determine the significance of any VUS's identified if any.   Based on Marisa Yu's family history of a BRCA1 mutation, she meets medical criteria for genetic testing. Despite that she meets criteria, she may still have an out of pocket cost. We discussed that if her out of pocket cost for testing is over $100, the laboratory will call and confirm whether she wants to proceed with testing.  If the out of pocket cost of testing is less than $100 she will be billed by the genetic testing laboratory.   We discussed that some people do not want to undergo genetic testing due to fear of genetic discrimination.  A federal law called the Genetic Information Non-Discrimination Act (GINA) of 2008 helps protect individuals against genetic discrimination based on their genetic test results.  It impacts both health insurance and employment.  For health insurance, it protects against increased premiums, being kicked off insurance or being forced to take a test in order to be insured.  For employment  it protects against hiring, firing and promoting decisions based on genetic test results.  Health status due to a cancer diagnosis is not protected under GINA.  This law does not protect life insurance, disability insurance, or other types of insurance.   PLAN: Despite our recommendation, Marisa Yu did not wish to pursue genetic testing at today's visit. We understand this decision, and remain available to coordinate genetic testing at any time in the future. We; therefore, recommend Marisa Yu continue to follow the cancer screening guidelines given by her primary healthcare provider.  Based on Marisa Yu's family history, we recommended all of her maternal relatives also have genetic counseling and testing. Marisa Yu will let us know if we can be of any  assistance in coordinating genetic counseling and/or testing for this family member.   Lastly, we encouraged Marisa Yu to remain in contact with cancer genetics annually so that we can continuously update the family history and inform her of any changes in cancer genetics and testing that may be of benefit for this family.   Ms.  Yu questions were answered to her satisfaction today. Our contact information was provided should additional questions or concerns arise. Thank you for the referral and allowing [place] to share in the care of your patient.   Tana Felts, MS, [facility] Certified Genetic Counselor [email or web address] phone: [phone]  The patient was seen for a total of 40 minutes in face-to-face genetic counseling.  The patient was accompanied today by her son's father.

## 2018-01-24 ENCOUNTER — Inpatient Hospital Stay: Payer: BLUE CROSS/BLUE SHIELD

## 2018-01-31 ENCOUNTER — Inpatient Hospital Stay: Payer: BLUE CROSS/BLUE SHIELD | Attending: Genetic Counselor

## 2018-02-07 ENCOUNTER — Encounter: Payer: Self-pay | Admitting: Genetics

## 2018-02-21 ENCOUNTER — Inpatient Hospital Stay: Payer: BLUE CROSS/BLUE SHIELD | Attending: Genetic Counselor

## 2018-03-02 ENCOUNTER — Telehealth: Payer: Self-pay | Admitting: Genetics

## 2018-03-02 NOTE — Telephone Encounter (Signed)
Marisa Yu had schedule a lab draw for 6/3 for genetic testing. She no showed.  I called to check in and see if the had decided not do pursue testing or if she would like to reschedule.  Mailbox full- could not leave message

## 2018-03-14 ENCOUNTER — Telehealth: Payer: Self-pay | Admitting: Genetics

## 2018-03-14 NOTE — Telephone Encounter (Signed)
Checked in with patient regarding genetic testing- she did not show for her previous blood draw appointments.  She said that she had wanted to get past her birthday without worrying about this, but would now like to proceed.  We scheduled her blood draw for Monday July 1 at 10:00AM.

## 2018-03-21 ENCOUNTER — Inpatient Hospital Stay: Payer: BLUE CROSS/BLUE SHIELD | Attending: Genetic Counselor

## 2018-04-01 ENCOUNTER — Telehealth: Payer: Self-pay | Admitting: Genetics

## 2018-04-04 NOTE — Telephone Encounter (Signed)
Revealed negative genetic testing.  Revealed that a VUS in WT1 was identified.   This normal result is reassuring and indicates that ms. Buchan does not carry the familial BRCA1 mutation or mutations in any of the other cancer risk genes analyzed.  While definitive regarding the presence of her family's BRCA1 mutation, genetic testing is not perfect, and cannot definitively rule out any hereditary cancer risk.  It will be important for her to keep in contact with genetics to learn if any additional testing may be needed in the future.     She should continue to follow medical management recommendations given to her by her physicians.  Other relatives should have testing for the familial BRCA1 mutation.

## 2018-04-15 ENCOUNTER — Ambulatory Visit: Payer: Self-pay | Admitting: Genetics

## 2018-04-15 ENCOUNTER — Encounter: Payer: Self-pay | Admitting: Genetics

## 2018-04-15 DIAGNOSIS — Z1379 Encounter for other screening for genetic and chromosomal anomalies: Secondary | ICD-10-CM

## 2018-04-15 DIAGNOSIS — Z803 Family history of malignant neoplasm of breast: Secondary | ICD-10-CM

## 2018-04-15 DIAGNOSIS — Z8481 Family history of carrier of genetic disease: Secondary | ICD-10-CM

## 2018-04-15 NOTE — Progress Notes (Signed)
HPI:  Marisa Yu was previously seen in the [facility] clinic on 01/18/2018 due to a family history of a BRCA1 and possibly BRCA2 mutation. Please refer to our prior cancer genetics clinic note for more information regarding Marisa Yu's medical, social and family histories, and our assessment and recommendations, at the time. Marisa Yu recent genetic test results were disclosed to her, as well as recommendations warranted by these results. These results and recommendations are discussed in more detail below.  CANCER HISTORY:   No history exists.     FAMILY HISTORY:  We obtained a detailed, 4-generation family history.  Significant diagnoses are listed below: Family History  Problem Relation Age of Onset  . Breast cancer Maternal Aunt        dx 40's  . Breast cancer Maternal Aunt        dx 40's  . Breast cancer Maternal Grandmother   . Heart disease Maternal Grandfather   . Breast cancer Cousin 42       BRCA1 +  . Other Cousin        BRCA1 +    Marisa Yu has 2 sons and 1 daughter with no history of cancer.  Marisa Yu has 2 paternal half-sisters and 3 paternal half brothers. All of these half-siblings are younger than Marisa Yu. No history of cancer in these relatives.    Marisa Yu father: is 31 with no history of cancer.   Paternal Aunts/Uncles: 1 paternal uncle in his 9's with no history of cancer. 1 paternal aunt in her 7's with no history of cancer.  Paternal cousins: no history of cancer.  Paternal grandfather: died young, no cancer Paternal grandmother:alive in 49's with no history of cancer.   Marisa Yu mother: died at 36 due to a drug overdose.  Maternal Aunts/Uncles:  -2 maternal aunts both died of breast cancer dx in their 64's.   -1 maternal half- aunt (through [facility]) reportedly has a BRCA2 mutation- this is reported through Marisa Yu cousins, Marisa Yu did not know about this finding in the family.  -2 maternal uncles with no history of  cancer.  Maternal cousins: 1 maternal uncle had 2 daughters who are both BRCA1 positive c.2475del (p.Asp825Glufs*21). One of these cousins was recently diagnosed with breast cancer at 37.   Maternal grandfather: heart disease.  Maternal grandmother:deceased, breast cancer  Patient's maternal ancestors are of African American descent, and paternal ancestors are of African American descent. There is no reported [name] ancestry. There is no known consanguinity.  GENETIC TEST RESULTS: Genetic testing performed through [facility]'s Multi-cancer Panel reported out on 04/01/2018 showed no pathogenic mutations. The Multi-Cancer Panel offered by [facility] includes sequencing and/or deletion duplication testing of the following 83 genes: ALK, APC, ATM, AXIN2,BAP1,  BARD1, BLM, BMPR1A, BRCA1, BRCA2, BRIP1, CASR, CDC73, CDH1, CDK4, CDKN1B, CDKN1C, CDKN2A (p14ARF), CDKN2A (p16INK4a), CEBPA, CHEK2, CTNNA1, DICER1, DIS3L2, EGFR (c.2369C>T, p.Thr790Met variant only), EPCAM (Deletion/duplication testing only), FH, FLCN, GATA2, GPC3, GREM1 (Promoter region deletion/duplication testing only), HOXB13 (c.251G>A, p.Gly84Glu), HRAS, KIT, MAX, MEN1, MET, MITF (c.952G>A, p.Glu318Lys variant only), MLH1, MSH2, MSH3, MSH6, MUTYH, NBN, NF1, NF2, NTHL1, PALB2, PDGFRA, PHOX2B, PMS2, POLD1, POLE, POT1, PRKAR1A, PTCH1, PTEN, RAD50, RAD51C, RAD51D, RB1, RECQL4, RET, RUNX1, SDHAF2, SDHA (sequence changes only), SDHB, SDHC, SDHD, SMAD4, SMARCA4, SMARCB1, SMARCE1, STK11, SUFU, TERC, TERT, TMEM127, TP53, TSC1, TSC2, VHL, WRN and WT1. .  A variant of uncertain significance (VUS) in a gene called WT1 was also noted. c.55C>T (p.Arg19Cys),  The test  report will be scanned into EPIC and will be located under the Molecular Pathology section of the Results Review tab. A portion of the result report is included below for reference.     We discussed with Marisa Yu that because current genetic testing is not perfect, it is possible there may  be a gene mutation in one of these genes that current testing cannot detect, but that chance is small.  We also discussed, that there could be another gene that has not yet been discovered, or that we have not yet tested, that is responsible for the cancer diagnoses in the family. It is also possible there is a hereditary cause for the cancer in the family that Marisa Yu did not inherit and therefore was not identified in her testing.  Therefore, it is important to remain in touch with cancer genetics in the future so that we can continue to offer Marisa Yu the most up to date genetic testing.   Regarding the VUS in WT1: At this time, it is unknown if this variant is associated with increased cancer risk or if this is a normal finding, but most variants such as this get reclassified to being inconsequential. It should not be used to make medical management decisions. With time, we suspect the lab will determine the significance of this variant, if any. If we do learn more about it, we will try to contact Marisa Yu to discuss it further. However, it is important to stay in touch with [place] periodically and keep the address and phone number up to date.  ADDITIONAL GENETIC TESTING: We discussed with Marisa Yu that her genetic testing was fairly extensive.  If there are are genes identified to increase cancer risk that can be analyzed in the future, we would be happy to discuss and coordinate this testing at that time.     CANCER SCREENING RECOMMENDATIONS: This negative result means Marisa Yu did not inherit the familial BRCA1 pathogenic variant.  We consider this a true negative result.  Her risk is likely close to that of the average population.  However, there may still be slight increased risk due to background familial risk, etc.  We also were not able to test everybody in the family with breast cancer so there are some family history of breast cancer we are not certain is due to the BRCA1 mutation.    It is recommended she continue to follow the cancer management and screening guidelines provided by her oncology and primary healthcare provider. An individual's cancer risk is not determined by genetic test results alone.  Overall cancer risk assessment includes additional factors such as personal medical history, family history, etc.  These should be used to make a personalized plan for cancer prevention and surveillance.     RECOMMENDATIONS FOR FAMILY MEMBERS:  We recommend all maternal relatives have genetic testing for the familial BRCA1 pathogenic variant.  Ms. Smithers will let us know if we can be of any assistance in coordinating genetic counseling and/or testing for these family members.   FOLLOW-UP: Lastly, we discussed with Ms. Newman that cancer genetics is a rapidly advancing field and it is possible that new genetic tests will be appropriate for her and/or her family members in the future. We encouraged her to remain in contact with cancer genetics on an annual basis so we can update her personal and family histories and let her know of advances in cancer genetics that may benefit this family.   Our contact number  was provided. Ms. Epler questions were answered to her satisfaction, and she knows she is welcome to call us at anytime with additional questions or concerns.   Ferol Luz, MS, [facility] Certified Genetic Counselor [email or web address]

## 2018-04-28 ENCOUNTER — Encounter: Payer: Self-pay | Admitting: Family Medicine

## 2018-04-28 ENCOUNTER — Ambulatory Visit (INDEPENDENT_AMBULATORY_CARE_PROVIDER_SITE_OTHER): Payer: BLUE CROSS/BLUE SHIELD | Admitting: Family Medicine

## 2018-04-28 VITALS — BP 140/90 | HR 86 | Temp 98.0°F | Wt 260.0 lb

## 2018-04-28 DIAGNOSIS — R030 Elevated blood-pressure reading, without diagnosis of hypertension: Secondary | ICD-10-CM

## 2018-04-28 DIAGNOSIS — R200 Anesthesia of skin: Secondary | ICD-10-CM | POA: Diagnosis not present

## 2018-04-28 DIAGNOSIS — R202 Paresthesia of skin: Secondary | ICD-10-CM | POA: Diagnosis not present

## 2018-04-28 NOTE — Patient Instructions (Signed)
DASH Eating Plan DASH stands for "Dietary Approaches to Stop Hypertension." The DASH eating plan is a healthy eating plan that has been shown to reduce high blood pressure (hypertension). It may also reduce your risk for type 2 diabetes, heart disease, and stroke. The DASH eating plan may also help with weight loss. What are tips for following this plan? General guidelines  Avoid eating more than 2,300 mg (milligrams) of salt (sodium) a day. If you have hypertension, you may need to reduce your sodium intake to 1,500 mg a day.  Limit alcohol intake to no more than 1 drink a day for nonpregnant women and 2 drinks a day for men. One drink equals 12 oz of beer, 5 oz of wine, or 1 oz of hard liquor.  Work with your health care provider to maintain a healthy body weight or to lose weight. Ask what an ideal weight is for you.  Get at least 30 minutes of exercise that causes your heart to beat faster (aerobic exercise) most days of the week. Activities may include walking, swimming, or biking.  Work with your health care provider or diet and nutrition specialist (dietitian) to adjust your eating plan to your individual calorie needs. Reading food labels  Check food labels for the amount of sodium per serving. Choose foods with less than 5 percent of the Daily Value of sodium. Generally, foods with less than 300 mg of sodium per serving fit into this eating plan.  To find whole grains, look for the word "whole" as the first word in the ingredient list. Shopping  Buy products labeled as "low-sodium" or "no salt added."  Buy fresh foods. Avoid canned foods and premade or frozen meals. Cooking  Avoid adding salt when cooking. Use salt-free seasonings or herbs instead of table salt or sea salt. Check with your health care provider or pharmacist before using salt substitutes.  Do not fry foods. Cook foods using healthy methods such as baking, boiling, grilling, and broiling instead.  Cook with  heart-healthy oils, such as olive, canola, soybean, or sunflower oil. Meal planning   Eat a balanced diet that includes: ? 5 or more servings of fruits and vegetables each day. At each meal, try to fill half of your plate with fruits and vegetables. ? Up to 6-8 servings of whole grains each day. ? Less than 6 oz of lean meat, poultry, or fish each day. A 3-oz serving of meat is about the same size as a deck of cards. One egg equals 1 oz. ? 2 servings of low-fat dairy each day. ? A serving of nuts, seeds, or beans 5 times each week. ? Heart-healthy fats. Healthy fats called Omega-3 fatty acids are found in foods such as flaxseeds and coldwater fish, like sardines, salmon, and mackerel.  Limit how much you eat of the following: ? Canned or prepackaged foods. ? Food that is high in trans fat, such as fried foods. ? Food that is high in saturated fat, such as fatty meat. ? Sweets, desserts, sugary drinks, and other foods with added sugar. ? Full-fat dairy products.  Do not salt foods before eating.  Try to eat at least 2 vegetarian meals each week.  Eat more home-cooked food and less restaurant, buffet, and fast food.  When eating at a restaurant, ask that your food be prepared with less salt or no salt, if possible. What foods are recommended? The items listed may not be a complete list. Talk with your dietitian about what   dietary choices are best for you. Grains Whole-grain or whole-wheat bread. Whole-grain or whole-wheat pasta. Brown rice. Oatmeal. Quinoa. Bulgur. Whole-grain and low-sodium cereals. Pita bread. Low-fat, low-sodium crackers. Whole-wheat flour tortillas. Vegetables Fresh or frozen vegetables (raw, steamed, roasted, or grilled). Low-sodium or reduced-sodium tomato and vegetable juice. Low-sodium or reduced-sodium tomato sauce and tomato paste. Low-sodium or reduced-sodium canned vegetables. Fruits All fresh, dried, or frozen fruit. Canned fruit in natural juice (without  added sugar). Meat and other protein foods Skinless chicken or turkey. Ground chicken or turkey. Pork with fat trimmed off. Fish and seafood. Egg whites. Dried beans, peas, or lentils. Unsalted nuts, nut butters, and seeds. Unsalted canned beans. Lean cuts of beef with fat trimmed off. Low-sodium, lean deli meat. Dairy Low-fat (1%) or fat-free (skim) milk. Fat-free, low-fat, or reduced-fat cheeses. Nonfat, low-sodium ricotta or cottage cheese. Low-fat or nonfat yogurt. Low-fat, low-sodium cheese. Fats and oils Soft margarine without trans fats. Vegetable oil. Low-fat, reduced-fat, or light mayonnaise and salad dressings (reduced-sodium). Canola, safflower, olive, soybean, and sunflower oils. Avocado. Seasoning and other foods Herbs. Spices. Seasoning mixes without salt. Unsalted popcorn and pretzels. Fat-free sweets. What foods are not recommended? The items listed may not be a complete list. Talk with your dietitian about what dietary choices are best for you. Grains Baked goods made with fat, such as croissants, muffins, or some breads. Dry pasta or rice meal packs. Vegetables Creamed or fried vegetables. Vegetables in a cheese sauce. Regular canned vegetables (not low-sodium or reduced-sodium). Regular canned tomato sauce and paste (not low-sodium or reduced-sodium). Regular tomato and vegetable juice (not low-sodium or reduced-sodium). Pickles. Olives. Fruits Canned fruit in a light or heavy syrup. Fried fruit. Fruit in cream or butter sauce. Meat and other protein foods Fatty cuts of meat. Ribs. Fried meat. Bacon. Sausage. Bologna and other processed lunch meats. Salami. Fatback. Hotdogs. Bratwurst. Salted nuts and seeds. Canned beans with added salt. Canned or smoked fish. Whole eggs or egg yolks. Chicken or turkey with skin. Dairy Whole or 2% milk, cream, and half-and-half. Whole or full-fat cream cheese. Whole-fat or sweetened yogurt. Full-fat cheese. Nondairy creamers. Whipped toppings.  Processed cheese and cheese spreads. Fats and oils Butter. Stick margarine. Lard. Shortening. Ghee. Bacon fat. Tropical oils, such as coconut, palm kernel, or palm oil. Seasoning and other foods Salted popcorn and pretzels. Onion salt, garlic salt, seasoned salt, table salt, and sea salt. Worcestershire sauce. Tartar sauce. Barbecue sauce. Teriyaki sauce. Soy sauce, including reduced-sodium. Steak sauce. Canned and packaged gravies. Fish sauce. Oyster sauce. Cocktail sauce. Horseradish that you find on the shelf. Ketchup. Mustard. Meat flavorings and tenderizers. Bouillon cubes. Hot sauce and Tabasco sauce. Premade or packaged marinades. Premade or packaged taco seasonings. Relishes. Regular salad dressings. Where to find more information:  National Heart, Lung, and Blood Institute: www.nhlbi.nih.gov  American Heart Association: www.heart.org Summary  The DASH eating plan is a healthy eating plan that has been shown to reduce high blood pressure (hypertension). It may also reduce your risk for type 2 diabetes, heart disease, and stroke.  With the DASH eating plan, you should limit salt (sodium) intake to 2,300 mg a day. If you have hypertension, you may need to reduce your sodium intake to 1,500 mg a day.  When on the DASH eating plan, aim to eat more fresh fruits and vegetables, whole grains, lean proteins, low-fat dairy, and heart-healthy fats.  Work with your health care provider or diet and nutrition specialist (dietitian) to adjust your eating plan to your individual   calorie needs. This information is not intended to replace advice given to you by your health care provider. Make sure you discuss any questions you have with your health care provider. Document Released: 08/27/2011 Document Revised: 08/31/2016 Document Reviewed: 08/31/2016 Elsevier Interactive Patient Education  2018 [employer].  Preventing Hypertension Hypertension, commonly called high blood pressure, is when the force  of blood pumping through the arteries is too strong. Arteries are blood vessels that carry blood from the heart throughout the body. Over time, hypertension can damage the arteries and decrease blood flow to important parts of the body, including the brain, heart, and kidneys. Often, hypertension does not cause symptoms until blood pressure is very high. For this reason, it is important to have your blood pressure checked on a regular basis. Hypertension can often be prevented with diet and lifestyle changes. If you already have hypertension, you can control it with diet and lifestyle changes, as well as medicine. What nutrition changes can be made? Maintain a healthy diet. This includes:  Eating less salt (sodium). Ask your health care provider how much sodium is safe for you to have. The general recommendation is to consume less than 1 tsp (2,300 mg) of sodium a day. ? Do not add salt to your food. ? Choose low-sodium options when grocery shopping and eating out.  Limiting fats in your diet. You can do this by eating low-fat or fat-free dairy products and by eating less red meat.  Eating more fruits, vegetables, and whole grains. Make a goal to eat: ? 1-2 cups of fresh fruits and vegetables each day. ? 3-4 servings of whole grains each day.  Avoiding foods and beverages that have added sugars.  Eating fish that contain healthy fats (omega-3 fatty acids), such as mackerel or salmon.  If you need help putting together a healthy eating plan, try the DASH diet. This diet is high in fruits, vegetables, and whole grains. It is low in sodium, red meat, and added sugars. DASH stands for Dietary Approaches to Stop Hypertension. What lifestyle changes can be made?  Lose weight if you are overweight. Losing just 3?5% of your body weight can help prevent or control hypertension. ? For example, if your present weight is 200 lb (91 kg), a loss of 3-5% of your weight means losing 6-10 lb (2.7-4.5  kg). ? Ask your health care provider to help you with a diet and exercise plan to safely lose weight.  Get enough exercise. Do at least 150 minutes of moderate-intensity exercise each week. ? You could do this in short exercise sessions several times a day, or you could do longer exercise sessions a few times a week. For example, you could take a brisk 10-minute walk or bike ride, 3 times a day, for 5 days a week.  Find ways to reduce stress, such as exercising, meditating, listening to music, or taking a yoga class. If you need help reducing stress, ask your health care provider.  Do not smoke. This includes e-cigarettes. Chemicals in tobacco and nicotine products raise your blood pressure each time you smoke. If you need help quitting, ask your health care provider.  Avoid alcohol. If you drink alcohol, limit alcohol intake to no more than 1 drink a day for nonpregnant women and 2 drinks a day for men. One drink equals 12 oz of beer, 5 oz of wine, or 1 oz of hard liquor. Why are these changes important? Diet and lifestyle changes can help you prevent hypertension, and  they may make you feel better overall and improve your quality of life. If you have hypertension, making these changes will help you control it and help prevent major complications, such as:  Hardening and narrowing of arteries that supply blood to: ? Your heart. This can cause a heart attack. ? Your brain. This can cause a stroke. ? Your kidneys. This can cause kidney failure.  Stress on your heart muscle, which can cause heart failure.  What can I do to lower my risk?  Work with your health care provider to make a hypertension prevention plan that works for you. Follow your plan and keep all follow-up visits as told by your health care provider.  Learn how to check your blood pressure at home. Make sure that you know your personal target blood pressure, as told by your health care provider. How is this treated? In  addition to diet and lifestyle changes, your health care provider may recommend medicines to help lower your blood pressure. You may need to try a few different medicines to find what works best for you. You also may need to take more than one medicine. Take over-the-counter and prescription medicines only as told by your health care provider. Where to find support: Your health care provider can help you prevent hypertension and help you keep your blood pressure at a healthy level. Your local hospital or your community may also provide support services and prevention programs. The American Heart Association offers an online support network at: https://www.lee.net/http://supportnetwork.heart.org/high-blood-pressure Where to find more information: Learn more about hypertension from:  National Heart, Lung, and Blood Institute: https://www.peterson.org/www.nhlbi.nih.gov/health/health-topics/topics/hbp  Centers for Disease Control and Prevention: AboutHD.co.nzwww.cdc.gov/bloodpressure  American Academy of Family Physicians: http://familydoctor.org/familydoctor/en/diseases-conditions/high-blood-pressure.printerview.all.html  Learn more about the DASH diet from:  National Heart, Lung, and Blood Institute: WedMap.itwww.nhlbi.nih.gov/health/health-topics/topics/dash  Contact a health care provider if:  You think you are having a reaction to medicines you have taken.  You have recurrent headaches or feel dizzy.  You have swelling in your ankles.  You have trouble with your vision. Summary  Hypertension often does not cause any symptoms until blood pressure is very high. It is important to get your blood pressure checked regularly.  Diet and lifestyle changes are the most important steps in preventing hypertension.  By keeping your blood pressure in a healthy range, you can prevent complications like heart attack, heart failure, stroke, and kidney failure.  Work with your health care provider to make a hypertension prevention plan that works for you. This  information is not intended to replace advice given to you by your health care provider. Make sure you discuss any questions you have with your health care provider. Document Released: 09/22/2015 Document Revised: 05/18/2016 Document Reviewed: 05/18/2016 Elsevier Interactive Patient Education  [employer]2018 Elsevier Inc.

## 2018-04-28 NOTE — Progress Notes (Signed)
Subjective:    Patient ID: Marisa Yu, female    DOB: 10/26/1985, 32 y.o.   MRN: [id number]  No chief complaint on file.   HPI Patient was seen today for ongoing concern.  Pt endorses tingling in her lower extremities and numbness in her right thumb times "a while".  Pt endorses eating fast food, oodles of noodles, and other ready to eat meals.  Pt has not been eating vegetables.  Pt endorses increased stress at work.  Trying to switch to an earlier shift so she can see her kids. Pt states she currently only gets 1 day off per wk.  She is off on Sunday, but goes in to work that evening.  Pt requesting a doctor's note as that will give her 3 day off and only "1 point".  Past Medical History:  Diagnosis Date  . Anemia   . Boil, arm   . Family history of BRCA1 gene positive 01/18/2018  . Family history of breast cancer   . Headache   . Hypertension   . Hypothyroidism   . Tooth ache   . Vaginal Pap smear, abnormal     Allergies  Allergen Reactions  . Codeine Itching    ROS General: Denies fever, chills, night sweats, changes in weight, changes in appetite HEENT: Denies headaches, ear pain, changes in vision, rhinorrhea, sore throat CV: Denies CP, palpitations, SOB, orthopnea Pulm: Denies SOB, cough, wheezing GI: Denies abdominal pain, nausea, vomiting, diarrhea, constipation GU: Denies dysuria, hematuria, frequency, vaginal discharge Msk: Denies muscle cramps, joint pains Neuro: Denies weakness  +numbness, tingling Skin: Denies rashes, bruising Psych: Denies depression, anxiety, hallucinations     Objective:    Blood pressure 140/90, pulse 86, temperature 98 F (36.7 C), temperature source Oral, weight 260 lb (117.9 kg), SpO2 98 %, currently breastfeeding.   Gen. Pleasant, well-nourished, in no distress, normal affect  HEENT: [place]/AT, face symmetric, no scleral icterus, PERRLA Lungs: no accessory muscle use Cardiovascular: RRR, no peripheral edema Neuro:  A&Ox3,  CN II-XII intact, normal gait   Wt Readings from Last 3 Encounters:  04/28/18 260 lb (117.9 kg)  12/20/17 261 lb (118.4 kg)  12/10/17 265 lb 9.6 oz (120.5 kg)    Lab Results  Component Value Date   WBC 7.2 11/25/2017   HGB 11.0 (L) 11/25/2017   HCT 34.9 11/25/2017   PLT 463 (H) 11/25/2017   GLUCOSE 89 11/25/2017   ALT 14 11/25/2017   AST 19 11/25/2017   NA 139 11/25/2017   K 3.9 11/25/2017   CL 102 11/25/2017   CREATININE 0.53 (L) 11/25/2017   BUN 10 11/25/2017   CO2 23 11/25/2017   TSH 1.430 11/25/2017   HGBA1C 5.8 (H) 11/25/2017    Assessment/Plan:  Elevated BP without diagnosis of hypertension -Discussed elevated BP 140/90 -Patient encouraged to decrease sodium intake, increase physical activity, increase p.o. intake of water, increase p.o. intake of vegetables -Patient encouraged to obtain BP cuff to monitor blood pressure at home. -Follow-up in the next 2 to 4 weeks for BP check.  Numbness and tingling -Discussed taking a daily multivitamin -Patient encouraged to improve diet and decrease sodium intake. -Discussed obtaining labs at next [facility] if numbness and tingling continue  No note given for work.  Follow-up in the next few weeks  Grier Mitts, MD

## 2018-05-25 ENCOUNTER — Telehealth: Payer: Self-pay

## 2018-05-25 MED ORDER — FLUCONAZOLE 150 MG PO TABS: 150.0000 mg | ORAL_TABLET | Freq: Once | ORAL | 0 refills | Status: AC

## 2018-05-25 NOTE — Telephone Encounter (Signed)
Patient called and stated that she took antibiotics and now has vaginal itching/irritation. Pt requests rx for yeast infection. Advised would send per provider approval.

## 2018-09-01 ENCOUNTER — Encounter: Payer: Self-pay | Admitting: Obstetrics

## 2018-09-01 ENCOUNTER — Ambulatory Visit: Payer: BLUE CROSS/BLUE SHIELD | Admitting: Obstetrics

## 2018-09-01 NOTE — Progress Notes (Signed)
Patient scheduled for STD SCREEN today but is on a heavy period. Appointment for STD SCREEN  rescheduled.

## 2018-09-12 ENCOUNTER — Ambulatory Visit: Payer: BLUE CROSS/BLUE SHIELD | Admitting: Obstetrics

## 2018-10-03 ENCOUNTER — Other Ambulatory Visit: Payer: Self-pay | Admitting: Family Medicine

## 2018-10-03 DIAGNOSIS — M545 Low back pain, unspecified: Secondary | ICD-10-CM

## 2018-10-03 DIAGNOSIS — G8929 Other chronic pain: Secondary | ICD-10-CM

## 2018-10-04 NOTE — Telephone Encounter (Signed)
Pt LOV was 04/28/2018 and last refill was 12/20/2017 for 30 tablets, please advise if ok to refill

## 2018-10-07 ENCOUNTER — Encounter: Payer: Self-pay | Admitting: Podiatry

## 2018-10-07 ENCOUNTER — Ambulatory Visit (INDEPENDENT_AMBULATORY_CARE_PROVIDER_SITE_OTHER): Payer: BLUE CROSS/BLUE SHIELD | Admitting: Podiatry

## 2018-10-07 ENCOUNTER — Ambulatory Visit (INDEPENDENT_AMBULATORY_CARE_PROVIDER_SITE_OTHER): Payer: BLUE CROSS/BLUE SHIELD

## 2018-10-07 VITALS — BP 147/91 | HR 83 | Resp 16

## 2018-10-07 DIAGNOSIS — M7752 Other enthesopathy of left foot: Secondary | ICD-10-CM

## 2018-10-07 DIAGNOSIS — M2141 Flat foot [pes planus] (acquired), right foot: Secondary | ICD-10-CM | POA: Diagnosis not present

## 2018-10-07 DIAGNOSIS — M722 Plantar fascial fibromatosis: Secondary | ICD-10-CM

## 2018-10-07 DIAGNOSIS — M216X2 Other acquired deformities of left foot: Secondary | ICD-10-CM

## 2018-10-07 DIAGNOSIS — M79671 Pain in right foot: Secondary | ICD-10-CM

## 2018-10-07 DIAGNOSIS — M2142 Flat foot [pes planus] (acquired), left foot: Secondary | ICD-10-CM

## 2018-10-07 DIAGNOSIS — M216X1 Other acquired deformities of right foot: Secondary | ICD-10-CM | POA: Diagnosis not present

## 2018-10-07 DIAGNOSIS — M79672 Pain in left foot: Secondary | ICD-10-CM

## 2018-10-07 NOTE — Patient Instructions (Signed)
For instructions on how to put on your Night Splint, please visit [email or web address]   Plantar Fasciitis (Heel Spur Syndrome) with Rehab The plantar fascia is a fibrous, ligament-like, soft-tissue structure that spans the bottom of the foot. Plantar fasciitis is a condition that causes pain in the foot due to inflammation of the tissue. SYMPTOMS   Pain and tenderness on the underneath side of the foot.  Pain that worsens with standing or walking. CAUSES  Plantar fasciitis is caused by irritation and injury to the plantar fascia on the underneath side of the foot. Common mechanisms of injury include:  Direct trauma to bottom of the foot.  Damage to a small nerve that runs under the foot where the main fascia attaches to the heel bone.  Stress placed on the plantar fascia due to bone spurs. RISK INCREASES WITH:   Activities that place stress on the plantar fascia (running, jumping, pivoting, or cutting).  Poor strength and flexibility.  Improperly fitted shoes.  Tight calf muscles.  Flat feet.  Failure to warm-up properly before activity.  Obesity. PREVENTION  Warm up and stretch properly before activity.  Allow for adequate recovery between workouts.  Maintain physical fitness:  Strength, flexibility, and endurance.  Cardiovascular fitness.  Maintain a health body weight.  Avoid stress on the plantar fascia.  Wear properly fitted shoes, including arch supports for individuals who have flat feet.  PROGNOSIS  If treated properly, then the symptoms of plantar fasciitis usually resolve without surgery. However, occasionally surgery is necessary.  RELATED COMPLICATIONS   Recurrent symptoms that may result in a chronic condition.  Problems of the lower back that are caused by compensating for the injury, such as limping.  Pain or weakness of the foot during push-off following surgery.  Chronic inflammation, scarring, and partial or complete fascia tear,  occurring more often from repeated injections.  TREATMENT  Treatment initially involves the use of ice and medication to help reduce pain and inflammation. The use of strengthening and stretching exercises may help reduce pain with activity, especially stretches of the Achilles tendon. These exercises may be performed at home or with a therapist. Your caregiver may recommend that you use heel cups of arch supports to help reduce stress on the plantar fascia. Occasionally, corticosteroid injections are given to reduce inflammation. If symptoms persist for greater than 6 months despite non-surgical (conservative), then surgery may be recommended.   MEDICATION   If pain medication is necessary, then nonsteroidal anti-inflammatory medications, such as aspirin and ibuprofen, or other minor pain relievers, such as acetaminophen, are often recommended.  Do not take pain medication within 7 days before surgery.  Prescription pain relievers may be given if deemed necessary by your caregiver. Use only as directed and only as much as you need.  Corticosteroid injections may be given by your caregiver. These injections should be reserved for the most serious cases, because they may only be given a certain number of times.  HEAT AND COLD  Cold treatment (icing) relieves pain and reduces inflammation. Cold treatment should be applied for 10 to 15 minutes every 2 to 3 hours for inflammation and pain and immediately after any activity that aggravates your symptoms. Use ice packs or massage the area with a piece of ice (ice massage).  Heat treatment may be used prior to performing the stretching and strengthening activities prescribed by your caregiver, physical therapist, or athletic trainer. Use a heat pack or soak the injury in warm water.  SEEK IMMEDIATE MEDICAL CARE   IF:  Treatment seems to offer no benefit, or the condition worsens.  Any medications produce adverse side effects.  EXERCISES- RANGE OF  MOTION (ROM) AND STRETCHING EXERCISES - Plantar Fasciitis (Heel Spur Syndrome) These exercises may help you when beginning to rehabilitate your injury. Your symptoms may resolve with or without further involvement from your physician, physical therapist or athletic trainer. While completing these exercises, remember:   Restoring tissue flexibility helps normal motion to return to the joints. This allows healthier, less painful movement and activity.  An effective stretch should be held for at least 30 seconds.  A stretch should never be painful. You should only feel a gentle lengthening or release in the stretched tissue.  RANGE OF MOTION - Toe Extension, Flexion  Sit with your right / left leg crossed over your opposite knee.  Grasp your toes and gently pull them back toward the top of your foot. You should feel a stretch on the bottom of your toes and/or foot.  Hold this stretch for 10 seconds.  Now, gently pull your toes toward the bottom of your foot. You should feel a stretch on the top of your toes and or foot.  Hold this stretch for 10 seconds. Repeat  times. Complete this stretch 3 times per day.   RANGE OF MOTION - Ankle Dorsiflexion, Active Assisted  Remove shoes and sit on a chair that is preferably not on a carpeted surface.  Place right / left foot under knee. Extend your opposite leg for support.  Keeping your heel down, slide your right / left foot back toward the chair until you feel a stretch at your ankle or calf. If you do not feel a stretch, slide your bottom forward to the edge of the chair, while still keeping your heel down.  Hold this stretch for 10 seconds. Repeat 3 times. Complete this stretch 2 times per day.   STRETCH  Gastroc, Standing  Place hands on wall.  Extend right / left leg, keeping the front knee somewhat bent.  Slightly point your toes inward on your back foot.  Keeping your right / left heel on the floor and your knee straight, shift  your weight toward the wall, not allowing your back to arch.  You should feel a gentle stretch in the right / left calf. Hold this position for 10 seconds. Repeat 3 times. Complete this stretch 2 times per day.  STRETCH  Soleus, Standing  Place hands on wall.  Extend right / left leg, keeping the other knee somewhat bent.  Slightly point your toes inward on your back foot.  Keep your right / left heel on the floor, bend your back knee, and slightly shift your weight over the back leg so that you feel a gentle stretch deep in your back calf.  Hold this position for 10 seconds. Repeat 3 times. Complete this stretch 2 times per day.  STRETCH  Gastrocsoleus, Standing  Note: This exercise can place a lot of stress on your foot and ankle. Please complete this exercise only if specifically instructed by your caregiver.   Place the ball of your right / left foot on a step, keeping your other foot firmly on the same step.  Hold on to the wall or a rail for balance.  Slowly lift your other foot, allowing your body weight to press your heel down over the edge of the step.  You should feel a stretch in your right / left calf.  Hold this position   for 10 seconds.  Repeat this exercise with a slight bend in your right / left knee. Repeat 3 times. Complete this stretch 2 times per day.   STRENGTHENING EXERCISES - Plantar Fasciitis (Heel Spur Syndrome)  These exercises may help you when beginning to rehabilitate your injury. They may resolve your symptoms with or without further involvement from your physician, physical therapist or athletic trainer. While completing these exercises, remember:   Muscles can gain both the endurance and the strength needed for everyday activities through controlled exercises.  Complete these exercises as instructed by your physician, physical therapist or athletic trainer. Progress the resistance and repetitions only as guided.  STRENGTH - Towel Curls  Sit in  a chair positioned on a non-carpeted surface.  Place your foot on a towel, keeping your heel on the floor.  Pull the towel toward your heel by only curling your toes. Keep your heel on the floor. Repeat 3 times. Complete this exercise 2 times per day.  STRENGTH - Ankle Inversion  Secure one end of a rubber exercise band/tubing to a fixed object (table, pole). Loop the other end around your foot just before your toes.  Place your fists between your knees. This will focus your strengthening at your ankle.  Slowly, pull your big toe up and in, making sure the band/tubing is positioned to resist the entire motion.  Hold this position for 10 seconds.  Have your muscles resist the band/tubing as it slowly pulls your foot back to the starting position. Repeat 3 times. Complete this exercises 2 times per day.  Document Released: 09/07/2005 Document Revised: 11/30/2011 Document Reviewed: 12/20/2008 [employer] Patient Information 2014 [employer].  

## 2018-10-09 NOTE — Progress Notes (Signed)
  Subjective:  Patient ID: Marisa Yu, female    DOB: 06-27-1986,  MRN: [id number]  Chief Complaint  Patient presents with  . Foot Pain    Plantar heel and anterior ankle bilateral (L>R) - aching x few years, AM pain, swelling, walking a lot - no treatment  . New Patient (Initial Visit)    33 y.o. female presents with the above complaint.   Review of Systems: Negative except as noted in the HPI. Denies N/V/F/Ch.  Past Medical History:  Diagnosis Date  . Anemia   . Boil, arm   . Family history of BRCA1 gene positive 01/18/2018  . Family history of breast cancer   . Headache   . Hypertension   . Hypothyroidism   . Tooth ache   . Vaginal Pap smear, abnormal    No current outpatient medications on file.  Social History   Tobacco Use  Smoking Status Former Smoker  . Types: Cigarettes  . Last attempt to quit: 09/22/2011  . Years since quitting: 7.0  Smokeless Tobacco Never Used    Allergies  Allergen Reactions  . Codeine Itching   Objective:   Vitals:   10/07/18 0851  BP: (!) 147/91  Pulse: 83  Resp: 16   There is no height or weight on file to calculate BMI. Constitutional Well developed. Well nourished.  Vascular Dorsalis pedis pulses palpable bilaterally. Posterior tibial pulses palpable bilaterally. Capillary refill normal to all digits.  No cyanosis or clubbing noted. Pedal hair growth normal.  Neurologic Normal speech. Oriented to person, place, and time. Epicritic sensation to light touch grossly present bilaterally.  Dermatologic Nails well groomed and normal in appearance. No open wounds. No skin lesions.  Orthopedic: Normal joint ROM without pain or crepitus bilaterally. No visible deformities. Tender to palpation at the calcaneal tuber bilaterally. No pain with calcaneal squeeze bilaterally. Ankle ROM diminished range with pain bilaterally.  Pain palpation about the left ankle gutter left Silfverskiold Test: positive bilaterally.    Radiographs: Taken and reviewed. No acute fractures or dislocations. No evidence of stress fracture.  Plantar heel spur absent. Posterior heel spur absent. Anterior spurring ankle left  Assessment:   1. Plantar fasciitis   2. Capsulitis of ankle, left   3. Acquired equinus deformity of both feet   4. Pes planus of both feet   5. Pain of both heels    Plan:  Patient was evaluated and treated and all questions answered.  Plantar Fasciitis, bilaterally - XR reviewed as above.  - Educated on icing and stretching. Instructions given.  - Injection delivered to the plantar fascia as below. - DME: Night splint dispensed.  Procedure: Injection Tendon/Ligament Location: Bilateral plantar fascia at the glabrous junction; medial approach. Skin Prep: alcohol Injectate: 1 cc 0.5% marcaine plain, 1 cc dexamethasone phosphate, 0.5 cc kenalog 10. Disposition: Patient tolerated procedure well. Injection site dressed with a band-aid.  L Ankle Capsulitis -Injection as below  Procedure: Joint Injection Location: Left ankle joint Skin Prep: Alcohol. Injectate: 1 cc 0.5 % marcaine plain, 1 cc celestone Disposition: Patient tolerated procedure well. Injection site dressed with a band-aid.  Return in about 3 weeks (around 10/28/2018) for Plantar fasciitis, both feet, Schedule with Liliane Channel ASAP for orthotics.

## 2018-10-13 ENCOUNTER — Ambulatory Visit (INDEPENDENT_AMBULATORY_CARE_PROVIDER_SITE_OTHER): Payer: BLUE CROSS/BLUE SHIELD | Admitting: Orthotics

## 2018-10-13 DIAGNOSIS — M2142 Flat foot [pes planus] (acquired), left foot: Secondary | ICD-10-CM

## 2018-10-13 DIAGNOSIS — M216X1 Other acquired deformities of right foot: Secondary | ICD-10-CM

## 2018-10-13 DIAGNOSIS — M216X2 Other acquired deformities of left foot: Secondary | ICD-10-CM

## 2018-10-13 DIAGNOSIS — M2141 Flat foot [pes planus] (acquired), right foot: Secondary | ICD-10-CM

## 2018-10-13 DIAGNOSIS — M7752 Other enthesopathy of left foot: Secondary | ICD-10-CM

## 2018-10-13 DIAGNOSIS — M722 Plantar fascial fibromatosis: Secondary | ICD-10-CM

## 2018-10-13 NOTE — Progress Notes (Signed)

## 2018-10-14 ENCOUNTER — Telehealth: Payer: Self-pay

## 2018-10-14 NOTE — Telephone Encounter (Signed)
Returned call, no answer, left vm 

## 2018-10-28 ENCOUNTER — Other Ambulatory Visit: Payer: BLUE CROSS/BLUE SHIELD

## 2018-10-31 ENCOUNTER — Other Ambulatory Visit: Payer: Self-pay | Admitting: Obstetrics

## 2018-10-31 DIAGNOSIS — N3000 Acute cystitis without hematuria: Secondary | ICD-10-CM

## 2018-10-31 MED ORDER — NITROFURANTOIN MONOHYD MACRO 100 MG PO CAPS
100.0000 mg | ORAL_CAPSULE | Freq: Two times a day (BID) | ORAL | 2 refills | Status: DC
Start: 2018-10-31 — End: 2020-10-28

## 2018-11-04 ENCOUNTER — Ambulatory Visit (INDEPENDENT_AMBULATORY_CARE_PROVIDER_SITE_OTHER): Payer: BLUE CROSS/BLUE SHIELD | Admitting: Podiatry

## 2018-11-04 ENCOUNTER — Ambulatory Visit: Payer: BLUE CROSS/BLUE SHIELD | Admitting: Orthotics

## 2018-11-04 DIAGNOSIS — M216X2 Other acquired deformities of left foot: Secondary | ICD-10-CM

## 2018-11-04 DIAGNOSIS — M7752 Other enthesopathy of left foot: Secondary | ICD-10-CM

## 2018-11-04 DIAGNOSIS — M722 Plantar fascial fibromatosis: Secondary | ICD-10-CM

## 2018-11-04 DIAGNOSIS — M216X1 Other acquired deformities of right foot: Secondary | ICD-10-CM

## 2018-11-07 NOTE — Progress Notes (Signed)
Patient came in today to pick up custom made foot orthotics.  The goals were accomplished and the patient reported no dissatisfaction with said orthotics.  Patient was advised of breakin period and how to report any issues. 

## 2018-11-10 ENCOUNTER — Ambulatory Visit (INDEPENDENT_AMBULATORY_CARE_PROVIDER_SITE_OTHER): Payer: BLUE CROSS/BLUE SHIELD

## 2018-11-10 DIAGNOSIS — R300 Dysuria: Secondary | ICD-10-CM

## 2018-11-10 LAB — POCT URINALYSIS DIPSTICK
Bilirubin, UA: NEGATIVE
Blood, UA: NEGATIVE
Glucose, UA: NEGATIVE
Ketones, UA: NEGATIVE
Leukocytes, UA: NEGATIVE
Nitrite, UA: NEGATIVE
PH UA: 7 (ref 5.0–8.0)
PROTEIN UA: NEGATIVE
Spec Grav, UA: 1.01 (ref 1.010–1.025)
Urobilinogen, UA: 0.2 E.U./dL

## 2018-11-10 MED ORDER — PHENAZOPYRIDINE HCL 200 MG PO TABS: 200.0000 mg | ORAL_TABLET | Freq: Three times a day (TID) | ORAL | 0 refills | Status: DC

## 2018-11-10 NOTE — Progress Notes (Signed)
Nurse visit for dysuria. Pt already taking Macrobid rx but still having some burning while voiding and urinary urgency. UCx sent to lab and Pyridium sent to pharm per protocol. Pt aware if sx's do not subside after txt, she needs to see a provider or report to Urgent Care.

## 2018-11-12 LAB — URINE CULTURE: ORGANISM ID, BACTERIA: NO GROWTH

## 2018-11-17 ENCOUNTER — Other Ambulatory Visit (HOSPITAL_COMMUNITY)
Admission: RE | Admit: 2018-11-17 | Discharge: 2018-11-17 | Disposition: A | Discharge status: Home / Self Care | Payer: BLUE CROSS/BLUE SHIELD | Source: Ambulatory Visit | Attending: Obstetrics | Admitting: Obstetrics

## 2018-11-17 ENCOUNTER — Other Ambulatory Visit: Payer: Self-pay

## 2018-11-17 ENCOUNTER — Ambulatory Visit (INDEPENDENT_AMBULATORY_CARE_PROVIDER_SITE_OTHER): Payer: Self-pay

## 2018-11-17 VITALS — BP 138/91 | HR 84 | Ht 65.0 in | Wt 267.2 lb

## 2018-11-17 DIAGNOSIS — N898 Other specified noninflammatory disorders of vagina: Secondary | ICD-10-CM | POA: Insufficient documentation

## 2018-11-17 NOTE — Progress Notes (Signed)
SUBJECTIVE:  33 y.o. female complains of vaginal irritation and burning x 2 week(s). Denies abnormal vaginal bleeding or significant pelvic pain or fever. No UTI symptoms. Denies history of known exposure to STD.  Patient's last menstrual period was 10/23/2018 (exact date).  OBJECTIVE:  She appears well, afebrile. Urine dipstick: not done.  ASSESSMENT:  Vaginal Discharge  Vaginal burning   PLAN:  GC, chlamydia, trichomonas, BVAG, CVAG probe sent to lab. Treatment: To be determined once lab results are received ROV prn if symptoms persist or worsen.

## 2018-11-18 LAB — CERVICOVAGINAL ANCILLARY ONLY
Bacterial vaginitis: NEGATIVE
Candida vaginitis: NEGATIVE
Chlamydia: NEGATIVE
Neisseria Gonorrhea: NEGATIVE
TRICH (WINDOWPATH): POSITIVE — AB

## 2018-11-19 ENCOUNTER — Other Ambulatory Visit: Payer: Self-pay | Admitting: Obstetrics

## 2018-11-19 DIAGNOSIS — A599 Trichomoniasis, unspecified: Secondary | ICD-10-CM

## 2018-11-19 MED ORDER — METRONIDAZOLE 500 MG PO TABS: 2000.0000 mg | ORAL_TABLET | Freq: Once | ORAL | 0 refills | Status: AC

## 2018-11-19 NOTE — Progress Notes (Signed)
  Subjective:  Patient ID: Marisa Yu, female    DOB: 12/26/1985,  MRN: [id number]  Chief Complaint  Patient presents with  . Ankle Pain    Left lateral ankle pain is returning, but only about 50% of what it was originally.   . Plantar Fasciitis    Pt states injections and night splint have been effective, no longer has any pain in bilateral heels.    33 y.o. female presents with the above complaint.   Review of Systems: Negative except as noted in the HPI. Denies N/V/F/Ch.  Past Medical History:  Diagnosis Date  . Anemia   . Boil, arm   . Family history of BRCA1 gene positive 01/18/2018  . Family history of breast cancer   . Headache   . Hypertension   . Hypothyroidism   . Tooth ache   . Vaginal Pap smear, abnormal     Current Outpatient Medications:  .  amoxicillin (AMOXIL) 500 MG capsule, TK ONE C PO  Q 8 H UNTIL GONE, Disp: , Rfl:  .  nitrofurantoin, macrocrystal-monohydrate, (MACROBID) 100 MG capsule, Take 1 capsule (100 mg total) by mouth 2 (two) times daily. 1 po BID x 7days, Disp: 14 capsule, Rfl: 2 .  metroNIDAZOLE (FLAGYL) 500 MG tablet, Take 4 tablets (2,000 mg total) by mouth once for 1 dose., Disp: 4 tablet, Rfl: 0 .  phenazopyridine (PYRIDIUM) 200 MG tablet, Take 1 tablet (200 mg total) by mouth 3 (three) times daily with meals., Disp: 6 tablet, Rfl: 0  Social History   Tobacco Use  Smoking Status Former Smoker  . Types: Cigarettes  . Last attempt to quit: 09/22/2011  . Years since quitting: 7.1  Smokeless Tobacco Never Used    Allergies  Allergen Reactions  . Codeine Itching   Objective:   There were no vitals filed for this visit. There is no height or weight on file to calculate BMI. Constitutional Well developed. Well nourished.  Vascular Dorsalis pedis pulses palpable bilaterally. Posterior tibial pulses palpable bilaterally. Capillary refill normal to all digits.  No cyanosis or clubbing noted. Pedal hair growth normal.  Neurologic  Normal speech. Oriented to person, place, and time. Epicritic sensation to light touch grossly present bilaterally.  Dermatologic Nails well groomed and normal in appearance. No open wounds. No skin lesions.  Orthopedic: Normal joint ROM without pain or crepitus bilaterally. No visible deformities. Tender to palpation at the calcaneal tuber bilaterally. No pain with calcaneal squeeze bilaterally. Ankle ROM diminished range with pain bilaterally.  Pain palpation about the left ankle gutter left Silfverskiold Test: positive bilaterally.   Radiographs: None  Assessment:   1. Plantar fasciitis   2. Capsulitis of ankle, left   3. Acquired equinus deformity of both feet    Plan:  Patient was evaluated and treated and all questions answered.  Pes planus, plantar fasciitis, ankle capsulitis -Trial orthotics to see if they can alleviate her pain.  No injection today.  No follow-ups on file.

## 2018-11-28 ENCOUNTER — Encounter: Payer: Self-pay | Admitting: Obstetrics

## 2018-11-28 ENCOUNTER — Ambulatory Visit (INDEPENDENT_AMBULATORY_CARE_PROVIDER_SITE_OTHER): Payer: BLUE CROSS/BLUE SHIELD | Admitting: Obstetrics

## 2018-11-28 ENCOUNTER — Other Ambulatory Visit (HOSPITAL_COMMUNITY)
Admission: RE | Admit: 2018-11-28 | Discharge: 2018-11-28 | Disposition: A | Discharge status: Home / Self Care | Payer: BLUE CROSS/BLUE SHIELD | Source: Ambulatory Visit | Attending: Obstetrics | Admitting: Obstetrics

## 2018-11-28 VITALS — BP 142/94 | HR 83 | Temp 98.1°F | Wt 260.2 lb

## 2018-11-28 DIAGNOSIS — J029 Acute pharyngitis, unspecified: Secondary | ICD-10-CM

## 2018-11-28 DIAGNOSIS — Z01419 Encounter for gynecological examination (general) (routine) without abnormal findings: Secondary | ICD-10-CM

## 2018-11-28 DIAGNOSIS — Z6841 Body Mass Index (BMI) 40.0 and over, adult: Secondary | ICD-10-CM

## 2018-11-28 DIAGNOSIS — N898 Other specified noninflammatory disorders of vagina: Secondary | ICD-10-CM

## 2018-11-28 DIAGNOSIS — Z30011 Encounter for initial prescription of contraceptive pills: Secondary | ICD-10-CM

## 2018-11-28 DIAGNOSIS — Z3009 Encounter for other general counseling and advice on contraception: Secondary | ICD-10-CM

## 2018-11-28 DIAGNOSIS — Z113 Encounter for screening for infections with a predominantly sexual mode of transmission: Secondary | ICD-10-CM

## 2018-11-28 DIAGNOSIS — N944 Primary dysmenorrhea: Secondary | ICD-10-CM

## 2018-11-28 DIAGNOSIS — E66813 Obesity, class 3: Secondary | ICD-10-CM

## 2018-11-28 DIAGNOSIS — Z202 Contact with and (suspected) exposure to infections with a predominantly sexual mode of transmission: Secondary | ICD-10-CM

## 2018-11-28 LAB — POCT RAPID STREP A (OFFICE): RAPID STREP A SCREEN: NEGATIVE

## 2018-11-28 MED ORDER — IBUPROFEN 800 MG PO TABS: 800.0000 mg | ORAL_TABLET | Freq: Three times a day (TID) | ORAL | 5 refills | Status: DC | PRN

## 2018-11-28 MED ORDER — NORETHIN ACE-ETH ESTRAD-FE 1-20 MG-MCG(24) PO TABS: 1.0000 | ORAL_TABLET | Freq: Every day | ORAL | 11 refills | Status: DC

## 2018-11-28 MED ORDER — AMOXICILLIN-POT CLAVULANATE 875-125 MG PO TABS: 1.0000 | ORAL_TABLET | Freq: Two times a day (BID) | ORAL | 0 refills | Status: DC

## 2018-11-28 MED ORDER — PHENTERMINE HCL 37.5 MG PO CAPS: 37.5000 mg | ORAL_CAPSULE | ORAL | 2 refills | Status: DC

## 2018-11-28 NOTE — Progress Notes (Signed)
Subjective:        Marisa Yu is a 33 y.o. female here for a routine exam.  Current complaints: None.    Personal health questionnaire:  Is patient [name], have a family history of breast and/or ovarian cancer: no Is there a family history of uterine cancer diagnosed at age < 56, gastrointestinal cancer, urinary tract cancer, family member who is a Field seismologist syndrome-associated carrier: no Is the patient overweight and hypertensive, family history of diabetes, personal history of gestational diabetes, preeclampsia or PCOS: no Is patient over 51, have PCOS,  family history of premature CHD under age 32, diabetes, smoke, have hypertension or peripheral artery disease:  no At any time, has a partner hit, kicked or otherwise hurt or frightened you?: no Over the past 2 weeks, have you felt down, depressed or hopeless?: no Over the past 2 weeks, have you felt little interest or pleasure in doing things?:no   Gynecologic History Patient's last menstrual period was 11/24/2018. Contraception: none Last Pap: 2019. Results were: normal Last mammogram: n/a. Results were: n/a  Obstetric History OB History  Gravida Para Term Preterm AB Living  _0 0 0 3  SAB TAB Ectopic Multiple Live Births  0 0 0 0 3    # Outcome Date GA Lbr Len/2nd Weight Sex Delivery Anes PTL Lv  3 Term 08/21/15 [redacted]w[redacted]d 6 lb 14.9 oz (3.145 kg) M CS-LVertical Spinal  LIV  2 Term 02/08/08 46w1d8 lb (3.629 kg) F CS-LTranv EPI N LIV  1 Term 11/14/02 4034w0d lb 13 oz (3.544 kg) M CS-LTranv EPI N LIV     Complications: Fetal Intolerance    Past Medical History:  Diagnosis Date  . Anemia   . Boil, arm   . Family history of BRCA1 gene positive 01/18/2018  . Family history of breast cancer   . Headache   . Hypertension   . Hypothyroidism   . Tooth ache   . Vaginal Pap smear, abnormal     Past Surgical History:  Procedure Laterality Date  . CESAREAN SECTION  2004  . CESAREAN SECTION  2009  .  CESAREAN SECTION WITH BILATERAL TUBAL LIGATION Bilateral 08/21/2015   Procedure: CESAREAN SECTION WITH BILATERAL TUBAL LIGATION;  Surgeon: ChaShelly BombardD;  [facility];  Service: Obstetrics;  Laterality: Bilateral;  . CYST EXCISION    . WISDOM TOOTH EXTRACTION       Current Outpatient Medications:  .  amoxicillin (AMOXIL) 500 MG capsule, TK ONE C PO  Q 8 H UNTIL GONE, Disp: , Rfl:  .  amoxicillin-clavulanate (AUGMENTIN) 875-125 MG tablet, Take 1 tablet by mouth 2 (two) times daily. 1 tablet po BID x10 days, Disp: 14 tablet, Rfl: 0 .  ibuprofen (ADVIL,MOTRIN) 800 MG tablet, Take 1 tablet (800 mg total) by mouth every 8 (eight) hours as needed., Disp: 30 tablet, Rfl: 5 .  nitrofurantoin, macrocrystal-monohydrate, (MACROBID) 100 MG capsule, Take 1 capsule (100 mg total) by mouth 2 (two) times daily. 1 po BID x 7days (Patient not taking: Reported on 11/28/2018), Disp: 14 capsule, Rfl: 2 .  Norethindrone Acetate-Ethinyl Estrad-FE (LOESTRIN 24 FE) 1-20 MG-MCG(24) tablet, Take 1 tablet by mouth daily., Disp: 1 Package, Rfl: 11 .  phenazopyridine (PYRIDIUM) 200 MG tablet, Take 1 tablet (200 mg total) by mouth 3 (three) times daily with meals. (Patient not taking: Reported on 11/28/2018), Disp: 6 tablet, Rfl: 0 .  phentermine 37.5 MG capsule, Take 1 capsule (37.5  mg total) by mouth every morning., Disp: 30 capsule, Rfl: 2 Allergies  Allergen Reactions  . Codeine Itching    Social History   Tobacco Use  . Smoking status: Former Smoker    Types: Cigarettes    Last attempt to quit: 09/22/2011    Years since quitting: 7.1  . Smokeless tobacco: Never Used  Substance Use Topics  . Alcohol use: No    Alcohol/week: 0.0 standard drinks    Family History  Problem Relation Age of Onset  . Breast cancer Maternal Aunt        dx 40's  . Breast cancer Maternal Aunt        dx 40's  . Breast cancer Maternal Grandmother   . Heart disease Maternal Grandfather   . Breast cancer Cousin 51       BRCA1 +   . Other Cousin        BRCA1 +      Review of Systems  Constitutional: negative for fatigue and weight loss Respiratory: negative for cough and wheezing Cardiovascular: negative for chest pain, fatigue and palpitations Gastrointestinal: negative for abdominal pain and change in bowel habits Musculoskeletal:negative for myalgias Neurological: negative for gait problems and tremors Behavioral/Psych: negative for abusive relationship, depression Endocrine: negative for temperature intolerance    Genitourinary:negative for abnormal menstrual periods, genital lesions, hot flashes, sexual problems and vaginal discharge Integument/breast: negative for breast lump, breast tenderness, nipple discharge and skin lesion(s)    Objective:       BP (!) 142/94   Pulse 83   Temp 98.1 F (36.7 C)   Wt 260 lb 3.2 oz (118 kg)   LMP 11/24/2018   BMI 43.30 kg/m  General:   alert  Skin:   no rash or abnormalities  Lungs:   clear to auscultation bilaterally  Heart:   regular rate and rhythm, S1, S2 normal, no murmur, click, rub or gallop  Breasts:   normal without suspicious masses, skin or nipple changes or axillary nodes  Abdomen:  normal findings: no organomegaly, soft, non-tender and no hernia  Pelvis:  External genitalia: normal general appearance Urinary system: urethral meatus normal and bladder without fullness, nontender Vaginal: normal without tenderness, induration or masses Cervix: normal appearance Adnexa: normal bimanual exam Uterus: anteverted and non-tender, normal size   Lab Review Urine pregnancy test Labs reviewed yes Radiologic studies reviewed no  50% of 20 min visit spent on counseling and coordination of care.   Assessment:    Healthy female exam.    Plan:    Education reviewed: calcium supplements, depression evaluation, low fat, low cholesterol diet, safe sex/STD prevention, self breast exams and weight bearing exercise. Contraception: OCP  (estrogen/progesterone). Follow up in: 1 year.   Meds ordered this encounter  Medications  . amoxicillin-clavulanate (AUGMENTIN) 875-125 MG tablet    Sig: Take 1 tablet by mouth 2 (two) times daily. 1 tablet po BID x10 days    Dispense:  14 tablet    Refill:  0  . Norethindrone Acetate-Ethinyl Estrad-FE (LOESTRIN 24 FE) 1-20 MG-MCG(24) tablet    Sig: Take 1 tablet by mouth daily.    Dispense:  1 Package    Refill:  11  . phentermine 37.5 MG capsule    Sig: Take 1 capsule (37.5 mg total) by mouth every morning.    Dispense:  30 capsule    Refill:  2  . ibuprofen (ADVIL,MOTRIN) 800 MG tablet    Sig: Take 1 tablet (800 mg total) by mouth  every 8 (eight) hours as needed.    Dispense:  30 tablet    Refill:  5   Orders Placed This Encounter  Procedures  . Hepatitis B surface antigen  . Hepatitis C antibody  . HIV Antibody (routine testing w rflx)  . RPR  . POCT rapid strep A    Shelly Bombard MD 11-28-2018

## 2018-11-28 NOTE — Addendum Note (Signed)
Addended by: Dalphine Handing on: 11/28/2018 10:15 AM   Modules accepted: Orders

## 2018-11-28 NOTE — Progress Notes (Signed)
Pt presents for annual and all STD testing.  Pt c/o sore throat x last week. Rapid Strep Test negative today.

## 2018-11-29 LAB — CERVICOVAGINAL ANCILLARY ONLY
BACTERIAL VAGINITIS: NEGATIVE
CANDIDA VAGINITIS: NEGATIVE
Chlamydia: NEGATIVE
NEISSERIA GONORRHEA: NEGATIVE
TRICH (WINDOWPATH): NEGATIVE

## 2018-11-29 LAB — HEPATITIS B SURFACE ANTIGEN: Hepatitis B Surface Ag: NEGATIVE

## 2018-11-29 LAB — RPR: RPR Ser Ql: NONREACTIVE

## 2018-11-29 LAB — CYTOLOGY - PAP
Diagnosis: NEGATIVE
HPV: NOT DETECTED

## 2018-11-29 LAB — HEPATITIS C ANTIBODY: Hep C Virus Ab: <0.1 s/co ratio (ref 0.0–0.9)

## 2018-11-29 LAB — HIV ANTIBODY (ROUTINE TESTING W REFLEX): HIV Screen 4th Generation wRfx: NONREACTIVE

## 2018-12-02 ENCOUNTER — Ambulatory Visit: Payer: BLUE CROSS/BLUE SHIELD | Admitting: Podiatry

## 2018-12-06 ENCOUNTER — Emergency Department (HOSPITAL_COMMUNITY)
Admission: EM | Admit: 2018-12-06 | Discharge: 2018-12-06 | Disposition: A | Discharge status: Home / Self Care | Payer: BLUE CROSS/BLUE SHIELD | Attending: Emergency Medicine | Admitting: Emergency Medicine

## 2018-12-06 ENCOUNTER — Encounter (HOSPITAL_COMMUNITY): Payer: Self-pay

## 2018-12-06 ENCOUNTER — Other Ambulatory Visit: Payer: Self-pay

## 2018-12-06 DIAGNOSIS — I10 Essential (primary) hypertension: Secondary | ICD-10-CM | POA: Insufficient documentation

## 2018-12-06 DIAGNOSIS — E039 Hypothyroidism, unspecified: Secondary | ICD-10-CM | POA: Diagnosis not present

## 2018-12-06 DIAGNOSIS — Z87891 Personal history of nicotine dependence: Secondary | ICD-10-CM | POA: Diagnosis not present

## 2018-12-06 DIAGNOSIS — Z79899 Other long term (current) drug therapy: Secondary | ICD-10-CM | POA: Diagnosis not present

## 2018-12-06 DIAGNOSIS — J029 Acute pharyngitis, unspecified: Secondary | ICD-10-CM | POA: Insufficient documentation

## 2018-12-06 LAB — GROUP A STREP BY PCR: Group A Strep by PCR: NOT DETECTED

## 2018-12-06 NOTE — Discharge Instructions (Addendum)
Follow up today for sore throat.  Your strep test was negative.  I would suggest OTC Zyrtec as well as warm water gargles.  Follow up with PCP if you continue to have symptoms.

## 2018-12-06 NOTE — ED Provider Notes (Signed)
[facility] EMERGENCY DEPARTMENT Provider Note   CSN: [id number] Arrival date & time: 12/06/18  1001  History   Chief Complaint Chief Complaint  Patient presents with  . Sore Throat    HPI Marisa Yu is a 33 y.o. female with past medical history significant for hypertension, hypothyroidism who presents for evaluation of sore throat.  Patient states she woke up this morning with a sore throat.  States this feels similar to when she has seasonal allergies.  Patient states she made a comment on Facebook and her employer texted her this morning that "I need to be evaluated to make sure I did not coronavirus."  Patient is been able to tolerate p.o. intake without difficulty.  States her throat throat is not painful, however more so scratchy.  Denies fever, chills, nausea, vomiting, nasal congestion, rhinorrhea, cough, chest pain, shortness of breath, abdominal pain, diarrhea dysuria.  Has not taken anything for symptoms PTA.  Denies additional aggravating or alleviating factors.  History obtained from patient.  No interpreter was used.     HPI  Past Medical History:  Diagnosis Date  . Anemia   . Boil, arm   . Family history of BRCA1 gene positive 01/18/2018  . Family history of breast cancer   . Headache   . Hypertension   . Hypothyroidism   . Tooth ache   . Vaginal Pap smear, abnormal     Patient Active Problem List   Diagnosis Date Noted  . Genetic testing 04/15/2018  . Family history of BRCA1 gene positive 01/18/2018  . Family history of breast cancer   . Prediabetes 11/29/2017  . S/P cesarean section 08/21/2015  . [redacted] weeks gestation of pregnancy   . Carbuncle and furuncle of buttock 01/30/2013  . BV (bacterial vaginosis) 01/30/2013    Past Surgical History:  Procedure Laterality Date  . CESAREAN SECTION  2004  . CESAREAN SECTION  2009  . CESAREAN SECTION WITH BILATERAL TUBAL LIGATION Bilateral 08/21/2015   Procedure: CESAREAN SECTION WITH  BILATERAL TUBAL LIGATION;  Surgeon: Shelly Bombard, MD;  Location: [facility];  Service: Obstetrics;  Laterality: Bilateral;  . CYST EXCISION    . WISDOM TOOTH EXTRACTION       OB History    Gravida  3   Para  3   Term  3   Preterm  0   AB  0   Living  3     SAB  0   TAB  0   Ectopic  0   Multiple  0   Live Births  3            Home Medications    Prior to Admission medications   Medication Sig Start Date End Date Taking? Authorizing Provider  amoxicillin (AMOXIL) 500 MG capsule TK ONE C PO  Q 8 H UNTIL GONE 10/08/18   [provider]  amoxicillin-clavulanate (AUGMENTIN) 875-125 MG tablet Take 1 tablet by mouth 2 (two) times daily. 1 tablet po BID x10 days 11/28/18   Shelly Bombard, MD  ibuprofen (ADVIL,MOTRIN) 800 MG tablet Take 1 tablet (800 mg total) by mouth every 8 (eight) hours as needed. 11/28/18   Shelly Bombard, MD  nitrofurantoin, macrocrystal-monohydrate, (MACROBID) 100 MG capsule Take 1 capsule (100 mg total) by mouth 2 (two) times daily. 1 po BID x 7days Patient not taking: Reported on 11/28/2018 10/31/18   Shelly Bombard, MD  Norethindrone Acetate-Ethinyl Estrad-FE (LOESTRIN 24 FE) 1-20 MG-MCG(24) tablet  Take 1 tablet by mouth daily. 11/28/18   Shelly Bombard, MD  phenazopyridine (PYRIDIUM) 200 MG tablet Take 1 tablet (200 mg total) by mouth 3 (three) times daily with meals. Patient not taking: Reported on 11/28/2018 11/10/18   Shelly Bombard, MD  phentermine 37.5 MG capsule Take 1 capsule (37.5 mg total) by mouth every morning. 11/28/18   Shelly Bombard, MD    Family History Family History  Problem Relation Age of Onset  . Breast cancer Maternal Aunt        dx 40's  . Breast cancer Maternal Aunt        dx 40's  . Breast cancer Maternal Grandmother   . Heart disease Maternal Grandfather   . Breast cancer Cousin 84       BRCA1 +  . Other Cousin        BRCA1 +    Social History Social History   Tobacco Use  . Smoking status:  Former Smoker    Types: Cigarettes    Last attempt to quit: 09/22/2011    Years since quitting: 7.2  . Smokeless tobacco: Never Used  Substance Use Topics  . Alcohol use: No    Alcohol/week: 0.0 standard drinks  . Drug use: No     Allergies   Codeine   Review of Systems Review of Systems  Constitutional: Negative.   HENT: Positive for sore throat. Negative for congestion, drooling, ear discharge, ear pain, facial swelling, hearing loss, mouth sores, nosebleeds, postnasal drip, rhinorrhea, sinus pressure, sinus pain, sneezing, tinnitus, trouble swallowing and voice change.   Eyes: Negative.   Respiratory: Negative.   Cardiovascular: Negative.   Gastrointestinal: Negative.   Genitourinary: Negative.   Musculoskeletal: Negative.   Skin: Negative.   Neurological: Negative.   All other systems reviewed and are negative.    Physical Exam Updated Vital Signs BP 133/86 (BP Location: Right Arm)   Pulse 72   Temp 98.3 F (36.8 C) (Oral)   Resp 16   LMP 11/24/2018   SpO2 100%   Physical Exam Vitals signs and nursing note reviewed.  Constitutional:      General: She is not in acute distress.    Appearance: She is not ill-appearing, toxic-appearing or diaphoretic.  HENT:     Head: Normocephalic and atraumatic.     Jaw: There is normal jaw occlusion.     Right Ear: Tympanic membrane, ear canal and external ear normal. There is no impacted cerumen. No hemotympanum. Tympanic membrane is not injected, scarred, perforated, erythematous, retracted or bulging.     Left Ear: Tympanic membrane, ear canal and external ear normal. There is no impacted cerumen. No hemotympanum. Tympanic membrane is not injected, scarred, perforated, erythematous, retracted or bulging.     Ears:     Comments: No Mastoid tenderness.    Nose:     Comments: Clear rhinorrhea and congestion to bilateral nares.  No sinus tenderness.    Mouth/Throat:     Comments: Posterior oropharynx clear.  Mucous membranes  moist.  Tonsils without erythema or exudate.  Uvula midline without deviation.  No evidence of PTA or RPA.  No drooling, dysphasia or trismus.  Phonation normal. Neck:     Trachea: Trachea and phonation normal.     Meningeal: Brudzinski's sign and Kernig's sign absent.     Comments: No Neck stiffness or neck rigidity.  No meningismus.  No cervical lymphadenopathy. Cardiovascular:     Comments: No murmurs rubs or gallops. Pulmonary:  Comments: Clear to auscultation bilaterally without wheeze, rhonchi or rales.  No accessory muscle usage.  Able speak in full sentences. Abdominal:     Comments: Soft, nontender without rebound or guarding.  No CVA tenderness.  Musculoskeletal:     Comments: Moves all 4 extremities without difficulty.  Lower extremities without edema, erythema or warmth.  Skin:    Comments: Brisk capillary refill.  No rashes or lesions.  Neurological:     Mental Status: She is alert.     Comments: Ambulatory in department without difficulty.  Cranial nerves II through XII grossly intact.  No facial droop.  No aphasia.    ED Treatments / Results  Labs (all labs ordered are listed, but only abnormal results are displayed) Labs Reviewed  GROUP A STREP BY PCR    EKG None  Radiology No results found.  Procedures Procedures (including critical care time)  Medications Ordered in ED Medications - No data to display   Initial Impression / Assessment and Plan / ED Course  I have reviewed the triage vital signs and the nursing notes.  Pertinent labs & imaging results that were available during my care of the patient were reviewed by me and considered in my medical decision making (see chart for details).  33 year old female appears otherwise well presents for evaluation of sore throat. Afebrile, nonseptic, non-ill-appearing.  Lungs clear.  No tachypnea, tachycardia or hypoxia, low suspicion for pneumonia.  No neck stiffness or neck rigidity.  No meningismus.   Posterior oropharynx clear.  No erythema or exudate.  No evidence of PTA or RPA.  Uvula midline without deviation.  No drooling, dysphasia or trismus.  Phonation normal.  Low suspicion for pharyngitis.  Tolerating p.o. intake in department without difficulty.  She has no sick contacts or known exposure to coronavirus patients.  We are not currently testing within this demographic as she has had no recent exposures and has no coronavirus symptoms.  No fever, shortness of breath or cough.  Strep negative.  Hemodynamically stable and appropriate for DC at this time.  She has had mildly elevated blood pressure in department.  She is asymptomatic without headache, emesis, vision changes, chest pain, shortness of breath, abdominal pain.  Discussed follow-up with PCP for reevaluation of this.  Discussed strict return precautions.  Patient voiced understanding and is agreeable to follow-up.     Final Clinical Impressions(s) / ED Diagnoses   Final diagnoses:  Sore throat    ED Discharge Orders    None       Henderly, Britni A, PA-C 12/06/18 1143    Daleen Bo, MD 12/06/18 1806

## 2018-12-06 NOTE — ED Triage Notes (Signed)
Pt states she has sore throat that started approximately one week ago. Was sent to be evaluated by employer.

## 2018-12-07 ENCOUNTER — Other Ambulatory Visit: Payer: Self-pay

## 2018-12-07 MED ORDER — FLUCONAZOLE 150 MG PO TABS: 150.0000 mg | ORAL_TABLET | Freq: Once | ORAL | 0 refills | Status: AC

## 2018-12-08 ENCOUNTER — Ambulatory Visit: Payer: BLUE CROSS/BLUE SHIELD | Admitting: Family Medicine

## 2019-03-15 ENCOUNTER — Other Ambulatory Visit: Payer: Self-pay

## 2019-03-15 MED ORDER — FLUCONAZOLE 150 MG PO TABS: 150.0000 mg | ORAL_TABLET | Freq: Once | ORAL | 3 refills | Status: AC

## 2019-03-15 NOTE — Progress Notes (Signed)
Recent ABx use per pt. Diflucan sent to the pharmacy per protocol.

## 2020-03-15 ENCOUNTER — Ambulatory Visit: Payer: BLUE CROSS/BLUE SHIELD | Admitting: Obstetrics

## 2020-03-17 DIAGNOSIS — Z803 Family history of malignant neoplasm of breast: Secondary | ICD-10-CM | POA: Insufficient documentation

## 2020-07-03 ENCOUNTER — Other Ambulatory Visit: Payer: Self-pay

## 2020-07-03 ENCOUNTER — Other Ambulatory Visit (HOSPITAL_COMMUNITY)
Admission: RE | Admit: 2020-07-03 | Discharge: 2020-07-03 | Disposition: A | Discharge status: Home / Self Care | Payer: Medicaid Other | Source: Ambulatory Visit | Attending: Obstetrics | Admitting: Obstetrics

## 2020-07-03 ENCOUNTER — Ambulatory Visit (INDEPENDENT_AMBULATORY_CARE_PROVIDER_SITE_OTHER): Payer: Medicaid Other | Admitting: Obstetrics

## 2020-07-03 ENCOUNTER — Encounter: Payer: Self-pay | Admitting: Obstetrics

## 2020-07-03 VITALS — BP 146/92 | HR 87 | Ht 65.0 in | Wt 257.4 lb

## 2020-07-03 DIAGNOSIS — N898 Other specified noninflammatory disorders of vagina: Secondary | ICD-10-CM

## 2020-07-03 DIAGNOSIS — Z113 Encounter for screening for infections with a predominantly sexual mode of transmission: Secondary | ICD-10-CM | POA: Diagnosis not present

## 2020-07-03 DIAGNOSIS — Z3009 Encounter for other general counseling and advice on contraception: Secondary | ICD-10-CM

## 2020-07-03 DIAGNOSIS — Z01419 Encounter for gynecological examination (general) (routine) without abnormal findings: Secondary | ICD-10-CM | POA: Insufficient documentation

## 2020-07-03 NOTE — Progress Notes (Addendum)
Subjective:        Marisa Yu is a 34 y.o. female here for a routine exam.  Current complaints: Vaginal discharge.    Personal health questionnaire:  Is patient Marisa Yu, have a family history of breast and/or ovarian cancer: yes Is there a family history of uterine cancer diagnosed at age < 44, gastrointestinal cancer, urinary tract cancer, family member who is a Field seismologist syndrome-associated carrier: no Is the patient overweight and hypertensive, family history of diabetes, personal history of gestational diabetes, preeclampsia or PCOS: no Is patient over 28, have PCOS,  family history of premature CHD under age 40, diabetes, smoke, have hypertension or peripheral artery disease:  no At any time, has a partner hit, kicked or otherwise hurt or frightened you?: no Over the past 2 weeks, have you felt down, depressed or hopeless?: no Over the past 2 weeks, have you felt little interest or pleasure in doing things?:no   Gynecologic History No LMP recorded. Contraception: tubal ligation Last Pap: 11-28-2018. Results were: normal Last mammogram: n/a. Results were: n/a  Obstetric History OB History  Gravida Para Term Preterm AB Living  '3 3 3 ' 0 0 3  SAB TAB Ectopic Multiple Live Births  0 0 0 0 3    # Outcome Date GA Lbr Len/2nd Weight Sex Delivery Anes PTL Lv  3 Term 08/21/15 [redacted]w[redacted]d 6 lb 14.9 oz (3.145 kg) M CS-LVertical Spinal  LIV  2 Term 02/08/08 430w1d8 lb (3.629 kg) F CS-LTranv EPI N LIV  1 Term 11/14/02 4047w0d lb 13 oz (3.544 kg) M CS-LTranv EPI N LIV     Complications: Fetal Intolerance    Past Medical History:  Diagnosis Date  . Anemia   . Boil, arm   . Family history of BRCA1 gene positive 01/18/2018  . Family history of breast cancer   . Headache   . Hypertension   . Hypothyroidism   . Tooth ache   . Vaginal Pap smear, abnormal     Past Surgical History:  Procedure Laterality Date  . CESAREAN SECTION  2004  . CESAREAN SECTION  2009  .  CESAREAN SECTION WITH BILATERAL TUBAL LIGATION Bilateral 08/21/2015   Procedure: CESAREAN SECTION WITH BILATERAL TUBAL LIGATION;  Surgeon: ChaShelly BombardD;  [facility];  Service: Obstetrics;  Laterality: Bilateral;  . CYST EXCISION    . WISDOM TOOTH EXTRACTION       Current Outpatient Medications:  .  amoxicillin (AMOXIL) 500 MG capsule, TK ONE C PO  Q 8 H UNTIL GONE, Disp: , Rfl:  .  amoxicillin-clavulanate (AUGMENTIN) 875-125 MG tablet, Take 1 tablet by mouth 2 (two) times daily. 1 tablet po BID x10 days, Disp: 14 tablet, Rfl: 0 .  ibuprofen (ADVIL,MOTRIN) 800 MG tablet, Take 1 tablet (800 mg total) by mouth every 8 (eight) hours as needed., Disp: 30 tablet, Rfl: 5 .  nitrofurantoin, macrocrystal-monohydrate, (MACROBID) 100 MG capsule, Take 1 capsule (100 mg total) by mouth 2 (two) times daily. 1 po BID x 7days (Patient not taking: Reported on 11/28/2018), Disp: 14 capsule, Rfl: 2 .  Norethindrone Acetate-Ethinyl Estrad-FE (LOESTRIN 24 FE) 1-20 MG-MCG(24) tablet, Take 1 tablet by mouth daily., Disp: 1 Package, Rfl: 11 .  phenazopyridine (PYRIDIUM) 200 MG tablet, Take 1 tablet (200 mg total) by mouth 3 (three) times daily with meals. (Patient not taking: Reported on 11/28/2018), Disp: 6 tablet, Rfl: 0 .  phentermine 37.5 MG capsule, Take 1 capsule (37.5 mg  total) by mouth every morning., Disp: 30 capsule, Rfl: 2 Allergies  Allergen Reactions  . Codeine Itching    Social History   Tobacco Use  . Smoking status: Former Smoker    Types: Cigarettes    Quit date: 09/22/2011    Years since quitting: 8.7  . Smokeless tobacco: Never Used  Substance Use Topics  . Alcohol use: No    Alcohol/week: 0.0 standard drinks    Family History  Problem Relation Age of Onset  . Breast cancer Maternal Aunt        dx 40's  . Breast cancer Maternal Aunt        dx 40's  . Breast cancer Maternal Grandmother   . Heart disease Maternal Grandfather   . Breast cancer Cousin 24       BRCA1 +  . Other  Cousin        BRCA1 +      Review of Systems  Constitutional: negative for fatigue and weight loss Respiratory: negative for cough and wheezing Cardiovascular: negative for chest pain, fatigue and palpitations Gastrointestinal: negative for abdominal pain and change in bowel habits Musculoskeletal:negative for myalgias Neurological: negative for gait problems and tremors Behavioral/Psych: negative for abusive relationship, depression Endocrine: negative for temperature intolerance    Genitourinary:negative for abnormal menstrual periods, genital lesions, hot flashes, sexual problems.  Positive for vaginal discharge Integument/breast: negative for breast lump, breast tenderness, nipple discharge and skin lesion(s)    Objective:       BP (!) 146/92   Pulse 87   Ht '5\' 5"'  (1.651 m)   Wt 257 lb 6.4 oz (116.8 kg)   BMI 42.83 kg/m  General:   alert and no distress  Skin:   no rash or abnormalities  Lungs:   clear to auscultation bilaterally  Heart:   regular rate and rhythm, S1, S2 normal, no murmur, click, rub or gallop  Breasts:   normal without suspicious masses, skin or nipple changes or axillary nodes  Abdomen:  normal findings: no organomegaly, soft, non-tender and no hernia  Pelvis:  External genitalia: normal general appearance Urinary system: urethral meatus normal and bladder without fullness, nontender Vaginal: normal without tenderness, induration or masses Cervix: normal appearance Adnexa: normal bimanual exam Uterus: anteverted and non-tender, normal size   Lab Review Urine pregnancy test Labs reviewed yes Radiologic studies reviewed no  50% of 20 min visit spent on counseling and coordination of care.   Assessment:      1. Encounter for gynecological examination with Papanicolaou smear of cervix Rx: - Cytology - PAP( [facility])  2. Vaginal discharge Rx: - Cervicovaginal ancillary only( [facility])  3. Screen for STD (sexually transmitted  disease) Rx: - HIV Antibody (routine testing w rflx) - Hepatitis B surface antigen - [facility] - Hepatitis C antibody    Plan:    Education reviewed: calcium supplements, depression evaluation, low fat, low cholesterol diet, safe sex/STD prevention, self breast exams and weight bearing exercise. Follow up in: 1 year.    Shelly Bombard, MD 07/03/2020 4:17 PM

## 2020-07-03 NOTE — Progress Notes (Signed)
Pt is in the office for annual. Last pap 11-28-18 Pt declines BC Pt desires std panel.

## 2020-07-03 NOTE — Addendum Note (Signed)
Addended by: Brock Bad on: 07/03/2020 04:33 PM   Modules accepted: Orders

## 2020-07-04 ENCOUNTER — Other Ambulatory Visit: Payer: Self-pay | Admitting: Obstetrics

## 2020-07-04 DIAGNOSIS — N760 Acute vaginitis: Secondary | ICD-10-CM

## 2020-07-04 DIAGNOSIS — B9689 Other specified bacterial agents as the cause of diseases classified elsewhere: Secondary | ICD-10-CM

## 2020-07-04 LAB — CYTOLOGY - PAP
Comment: NEGATIVE
Diagnosis: NEGATIVE
High risk HPV: NEGATIVE

## 2020-07-04 LAB — CERVICOVAGINAL ANCILLARY ONLY
Bacterial Vaginitis (gardnerella): POSITIVE — AB
Candida Glabrata: NEGATIVE
Candida Vaginitis: NEGATIVE
Chlamydia: NEGATIVE
Comment: NEGATIVE
Comment: NEGATIVE
Comment: NEGATIVE
Comment: NEGATIVE
Comment: NEGATIVE
Comment: NORMAL
Neisseria Gonorrhea: NEGATIVE
Trichomonas: NEGATIVE

## 2020-07-04 LAB — HEPATITIS C ANTIBODY: Hep C Virus Ab: 0.1 s/co ratio (ref 0.0–0.9)

## 2020-07-04 LAB — HEPATITIS B SURFACE ANTIGEN: Hepatitis B Surface Ag: NEGATIVE

## 2020-07-04 LAB — HIV ANTIBODY (ROUTINE TESTING W REFLEX): HIV Screen 4th Generation wRfx: NONREACTIVE

## 2020-07-04 LAB — RPR: RPR Ser Ql: NONREACTIVE

## 2020-07-04 MED ORDER — TINIDAZOLE 500 MG PO TABS: 1000.0000 mg | ORAL_TABLET | Freq: Every day | ORAL | 2 refills | Status: DC

## 2020-10-28 ENCOUNTER — Other Ambulatory Visit: Payer: Self-pay

## 2020-10-28 MED ORDER — NITROFURANTOIN MONOHYD MACRO 100 MG PO CAPS: 100.0000 mg | ORAL_CAPSULE | Freq: Two times a day (BID) | ORAL | 0 refills | Status: DC

## 2020-10-28 NOTE — Telephone Encounter (Signed)
Patient would like an antibiotic called into her pharmacy for UTI. She can not come in due to the weather. I have advised her I will call in once refill but if she does not have any relief in the next couple of days she will need to come in and live a sample. I explained to patient some uti infection work better with other medications depending on the type of uti. Patient voice understanding.

## 2020-10-31 ENCOUNTER — Other Ambulatory Visit: Payer: Self-pay

## 2020-10-31 ENCOUNTER — Ambulatory Visit (INDEPENDENT_AMBULATORY_CARE_PROVIDER_SITE_OTHER): Payer: Self-pay | Admitting: *Deleted

## 2020-10-31 DIAGNOSIS — R399 Unspecified symptoms and signs involving the genitourinary system: Secondary | ICD-10-CM

## 2020-10-31 LAB — POCT URINALYSIS DIPSTICK
Bilirubin, UA: NEGATIVE
Glucose, UA: NEGATIVE
Ketones, UA: NEGATIVE
Nitrite, UA: NEGATIVE
Protein, UA: NEGATIVE
Spec Grav, UA: 1.02 (ref 1.010–1.025)
Urobilinogen, UA: 0.2 E.U./dL
pH, UA: 6.5 (ref 5.0–8.0)

## 2020-10-31 NOTE — Progress Notes (Signed)
Patient was assessed and managed by nursing staff during this encounter. I have reviewed the chart and agree with the documentation and plan. I have also made any necessary editorial changes.  Maleta Pacha A Germain Koopmann, MD 10/31/2020 5:02 PM   

## 2020-10-31 NOTE — Progress Notes (Signed)
Pt is in office for urine check.  Pt states she was given medication earlier this week with little relief of symptoms.  Pt is currently taking Macrobid.  Udip and UC were ordered today. Udip shows +WBC and trace RBC.Marland Kitchen   Pt advised to increase fluid intake. Pt advised to continue Rx until urine culture has resulted; at that time she may need change in medication.  Pt has no other concerns today.

## 2020-11-02 LAB — URINE CULTURE: Organism ID, Bacteria: NO GROWTH

## 2020-12-09 ENCOUNTER — Emergency Department (HOSPITAL_COMMUNITY)
Admission: EM | Admit: 2020-12-09 | Discharge: 2020-12-09 | Disposition: A | Discharge status: Home / Self Care | Payer: No Typology Code available for payment source | Attending: Emergency Medicine | Admitting: Emergency Medicine

## 2020-12-09 ENCOUNTER — Emergency Department (HOSPITAL_COMMUNITY): Payer: No Typology Code available for payment source

## 2020-12-09 ENCOUNTER — Other Ambulatory Visit: Payer: Self-pay

## 2020-12-09 ENCOUNTER — Encounter (HOSPITAL_COMMUNITY): Payer: Self-pay

## 2020-12-09 DIAGNOSIS — Z87891 Personal history of nicotine dependence: Secondary | ICD-10-CM | POA: Diagnosis not present

## 2020-12-09 DIAGNOSIS — E039 Hypothyroidism, unspecified: Secondary | ICD-10-CM | POA: Insufficient documentation

## 2020-12-09 DIAGNOSIS — I10 Essential (primary) hypertension: Secondary | ICD-10-CM | POA: Diagnosis not present

## 2020-12-09 DIAGNOSIS — M545 Low back pain, unspecified: Secondary | ICD-10-CM

## 2020-12-09 DIAGNOSIS — M5459 Other low back pain: Secondary | ICD-10-CM | POA: Diagnosis not present

## 2020-12-09 LAB — URINALYSIS, ROUTINE W REFLEX MICROSCOPIC
Bilirubin Urine: NEGATIVE
Glucose, UA: NEGATIVE mg/dL
Hgb urine dipstick: NEGATIVE
Ketones, ur: 5 mg/dL — AB
Leukocytes,Ua: NEGATIVE
Nitrite: NEGATIVE
Protein, ur: NEGATIVE mg/dL
Specific Gravity, Urine: 1.028 (ref 1.005–1.030)
pH: 5 (ref 5.0–8.0)

## 2020-12-09 LAB — PREGNANCY, URINE: Preg Test, Ur: NEGATIVE

## 2020-12-09 MED ORDER — IBUPROFEN 800 MG PO TABS: 800.0000 mg | ORAL_TABLET | Freq: Three times a day (TID) | ORAL | 0 refills | Status: DC | PRN

## 2020-12-09 MED ORDER — DICLOFENAC SODIUM 1 % EX GEL: 2.0000 g | Freq: Four times a day (QID) | CUTANEOUS | 0 refills | Status: DC | PRN

## 2020-12-09 NOTE — ED Triage Notes (Signed)
Pt presents with c/o back pain in the middle of her lower back since last week after she moved some furniture at work.

## 2020-12-09 NOTE — ED Provider Notes (Signed)
Emergency Department Provider Note   I have reviewed the triage vital signs and the nursing notes.   HISTORY  Chief Complaint Back Pain   HPI Marisa Yu is a 35 y.o. female with PMH reviewed below presents to the ED for evaluation of lower back pain.  Symptoms have been mild over the past week but then worsened today when the patient was bending over.  She has midline lower back pain which is not radiating to the buttocks, legs, flanks, abdomen.  No urinary tract infection type symptoms.  She does note some mild burning occasionally when her menstrual cycle is on but none currently. She has had a tubal ligation. No fever or chills.  She states that she is concerned about the back pain because she has a family history of cancer and 2 individuals in her family had the cancer diagnosed after developing back discomfort.  She does not have a primary care doctor.  Denies any numbness or weakness in her legs.  No groin numbness.  No bowel or bladder incontinence or urinary retention symptoms.  No clear injury to incite the pain.    Past Medical History:  Diagnosis Date  . Anemia   . Boil, arm   . Family history of BRCA1 gene positive 01/18/2018  . Family history of breast cancer   . Headache   . Hypertension   . Hypothyroidism   . Tooth ache   . Vaginal Pap smear, abnormal     Patient Active Problem List   Diagnosis Date Noted  . Genetic testing 04/15/2018  . Family history of BRCA1 gene positive 01/18/2018  . Family history of breast cancer   . Prediabetes 11/29/2017  . S/P cesarean section 08/21/2015  . [redacted] weeks gestation of pregnancy   . Carbuncle and furuncle of buttock 01/30/2013  . BV (bacterial vaginosis) 01/30/2013    Past Surgical History:  Procedure Laterality Date  . CESAREAN SECTION  2004  . CESAREAN SECTION  2009  . CESAREAN SECTION WITH BILATERAL TUBAL LIGATION Bilateral 08/21/2015   Procedure: CESAREAN SECTION WITH BILATERAL TUBAL LIGATION;  Surgeon:  Shelly Bombard, MD;  Location: [facility];  Service: Obstetrics;  Laterality: Bilateral;  . CYST EXCISION    . WISDOM TOOTH EXTRACTION      Allergies Codeine  Family History  Problem Relation Age of Onset  . Breast cancer Maternal Aunt        dx 40's  . Breast cancer Maternal Aunt        dx 40's  . Breast cancer Maternal Grandmother   . Heart disease Maternal Grandfather   . Breast cancer Cousin 13       BRCA1 +  . Other Cousin        BRCA1 +    Social History Social History   Tobacco Use  . Smoking status: Former Smoker    Types: Cigarettes    Quit date: 09/22/2011    Years since quitting: 9.2  . Smokeless tobacco: Never Used  Vaping Use  . Vaping Use: Never used  Substance Use Topics  . Alcohol use: No    Alcohol/week: 0.0 standard drinks  . Drug use: No    Review of Systems  Constitutional: No fever/chills Eyes: No visual changes. ENT: No sore throat. Cardiovascular: Denies chest pain. Respiratory: Denies shortness of breath. Gastrointestinal: No abdominal pain.  No nausea, no vomiting.  No diarrhea.  No constipation. Genitourinary: Negative for dysuria. Musculoskeletal: Positive for back pain. Skin: Negative for  rash. Neurological: Negative for headaches, focal weakness or numbness.  10-point ROS otherwise negative.  ____________________________________________   PHYSICAL EXAM:  VITAL SIGNS: ED Triage Vitals  Enc Vitals Group     BP 12/09/20 0930 (!) 155/112     Pulse Rate 12/09/20 0930 83     Resp 12/09/20 0930 18     Temp 12/09/20 0930 98.2 F (36.8 C)     Temp Source 12/09/20 0930 Oral     SpO2 12/09/20 0930 100 %   Constitutional: Alert and oriented. Well appearing and in no acute distress. Eyes: Conjunctivae are normal.  Head: Atraumatic. Nose: No congestion/rhinnorhea. Mouth/Throat: Mucous membranes are moist.  Neck: No stridor.   Cardiovascular: Normal rate, regular rhythm. Good peripheral circulation. Grossly normal heart sounds.    Respiratory: Normal respiratory effort.  No retractions. Lungs CTAB. Gastrointestinal: Soft and nontender. No distention.  Musculoskeletal: No lower extremity tenderness nor edema. No gross deformities of extremities. No midline thoracic or upper lumbar spine tenderness in the midline. Mild midline tenderness in the L4/5 region. No paraspinal msk tenderness.  Neurologic:  Normal speech and language. No gross focal neurologic deficits are appreciated. Normal strength and sensation in the bilateral LEs. 2+ patellar reflexes.  Skin:  Skin is warm, dry and intact. No rash noted.  ____________________________________________   LABS (all labs ordered are listed, but only abnormal results are displayed)  Labs Reviewed  URINALYSIS, ROUTINE W REFLEX MICROSCOPIC - Abnormal; Notable for the following components:      Result Value   Ketones, ur 5 (*)    All other components within normal limits  PREGNANCY, URINE   ____________________________________________  RADIOLOGY  DG Lumbar Spine Complete  Result Date: 12/09/2020 CLINICAL DATA:  Mid to lower back pain x1 week EXAM: LUMBAR SPINE - COMPLETE 4+ VIEW COMPARISON:  None. FINDINGS: There is no evidence of lumbar spine fracture. Alignment is normal. Intervertebral disc spaces are maintained. Cholelithiasis. IMPRESSION: 1. No acute osseous abnormality of the lumbar spine. 2. Cholelithiasis. Electronically Signed   By: Dahlia Bailiff MD   On: 12/09/2020 10:03    ____________________________________________   PROCEDURES  Procedure(s) performed:   Procedures  None  ____________________________________________   INITIAL IMPRESSION / ASSESSMENT AND PLAN / ED COURSE  Pertinent labs & imaging results that were available during my care of the patient were reviewed by me and considered in my medical decision making (see chart for details).   Patient presents to the emergency department for evaluation of lower back pain without radiation into  the legs.  No neuro deficits.  No red flag signs or symptoms to suspect acute spine emergency such as cauda equina.  Abdomen is soft and nontender.  Doubt acute surgical process in the abdomen.  Patient does have some midline back pain and tenderness on exam.  She notes a family history of cancers which were discovered with back pain as the presenting symptom which is her primary concern.  We will order plain films of the lumbar spine along with UA and pregnancy test.  Lower suspicion for acute fracture.  Will want to rule out obvious pathologic fracture or bony lesion on plain film to cause patient's atraumatic back pain.   11:00 AM  Patient's UA and pregnancy test are negative.  Lumbar plain films reviewed noting no acute osseous abnormality in the lumbar spine.  Plan for topical pain medication along with prescription strength ibuprofen.  Have advised patient to establish care with PCP for further cancer screening and preventative health measures  as an outpatient.  I do not see indication for emergent MRI of the spine or other than spine imaging.  Not seen indication for outpatient referral to spine surgery.  Wrote for the patient to be on light duty for the next week at work.  Discussed ED return precautions and provided in writing as well. ____________________________________________  FINAL CLINICAL IMPRESSION(S) / ED DIAGNOSES  Final diagnoses:  Acute midline low back pain without sciatica    NEW OUTPATIENT MEDICATIONS STARTED DURING THIS VISIT:  New Prescriptions   DICLOFENAC SODIUM (VOLTAREN) 1 % GEL    Apply 2 g topically 4 (four) times daily as needed (back pain).   IBUPROFEN (ADVIL) 800 MG TABLET    Take 1 tablet (800 mg total) by mouth every 8 (eight) hours as needed for moderate pain.    Note:  This document was prepared using Dragon voice recognition software and may include unintentional dictation errors.  Nanda Quinton, MD, [facility] Emergency Medicine    Sang Blount, [facility],  MD 12/09/20 1115

## 2020-12-09 NOTE — Discharge Instructions (Signed)
You have been seen in the Emergency Department (ED)  today for back pain.  Your workup and exam have not shown any acute abnormalities and you are likely suffering from muscle strain or possible problems with your discs, but there is no treatment that will fix your symptoms at this time.  Please take Motrin (ibuprofen) as needed for your pain according to the instructions written on the box.  Alternatively, for the next five days you can take 800mg three times daily with meals (it may upset your stomach).   Please follow up with your doctor as soon as possible regarding today's ED visit and your back pain.  Return to the ED for worsening back pain, fever, weakness or numbness of either leg, or if you develop either (1) an inability to urinate or have bowel movements, or (2) loss of your ability to control your bathroom functions (if you start having "accidents"), or if you develop other new symptoms that concern you.  

## 2020-12-10 DIAGNOSIS — M545 Low back pain, unspecified: Secondary | ICD-10-CM | POA: Diagnosis not present

## 2020-12-25 DIAGNOSIS — M545 Low back pain, unspecified: Secondary | ICD-10-CM | POA: Diagnosis not present

## 2020-12-25 DIAGNOSIS — G8929 Other chronic pain: Secondary | ICD-10-CM | POA: Diagnosis not present

## 2020-12-25 DIAGNOSIS — L0232 Furuncle of buttock: Secondary | ICD-10-CM | POA: Diagnosis not present

## 2020-12-26 DIAGNOSIS — G8929 Other chronic pain: Secondary | ICD-10-CM | POA: Diagnosis not present

## 2020-12-26 DIAGNOSIS — M545 Low back pain, unspecified: Secondary | ICD-10-CM | POA: Diagnosis not present

## 2021-02-12 ENCOUNTER — Ambulatory Visit: Payer: BC Managed Care – PPO | Admitting: Physician Assistant

## 2021-03-28 DIAGNOSIS — G5621 Lesion of ulnar nerve, right upper limb: Secondary | ICD-10-CM | POA: Diagnosis not present

## 2021-04-01 ENCOUNTER — Ambulatory Visit (INDEPENDENT_AMBULATORY_CARE_PROVIDER_SITE_OTHER): Payer: BC Managed Care – PPO

## 2021-04-01 ENCOUNTER — Other Ambulatory Visit: Payer: Self-pay

## 2021-04-01 ENCOUNTER — Other Ambulatory Visit (HOSPITAL_COMMUNITY)
Admission: RE | Admit: 2021-04-01 | Discharge: 2021-04-01 | Disposition: A | Discharge status: Home / Self Care | Payer: BC Managed Care – PPO | Source: Ambulatory Visit | Attending: Obstetrics and Gynecology | Admitting: Obstetrics and Gynecology

## 2021-04-01 VITALS — BP 126/76 | HR 86 | Ht 65.0 in | Wt 252.0 lb

## 2021-04-01 DIAGNOSIS — N898 Other specified noninflammatory disorders of vagina: Secondary | ICD-10-CM | POA: Diagnosis not present

## 2021-04-01 NOTE — Progress Notes (Signed)
SUBJECTIVE:  35 y.o. female complains of malodorous vaginal discharge, itching for 5 day(s). Denies abnormal vaginal bleeding or significant pelvic pain or fever. No UTI symptoms. Denies history of known exposure to STD.  Patient's last menstrual period was 03/17/2021 (approximate).  OBJECTIVE:  She appears well, afebrile. Urine dipstick: not done.  ASSESSMENT:  Vaginal Discharge  Vaginal Odor   PLAN:  GC, chlamydia, trichomonas, BVAG, CVAG probe sent to lab. Treatment: To be determined once lab results are received ROV prn if symptoms persist or worsen.

## 2021-04-02 LAB — CERVICOVAGINAL ANCILLARY ONLY
Bacterial Vaginitis (gardnerella): NEGATIVE
Chlamydia: NEGATIVE
Comment: NEGATIVE
Comment: NEGATIVE
Comment: NEGATIVE
Comment: NORMAL
Neisseria Gonorrhea: NEGATIVE
Trichomonas: POSITIVE — AB

## 2021-04-03 ENCOUNTER — Other Ambulatory Visit: Payer: Self-pay

## 2021-04-03 MED ORDER — FLUCONAZOLE 150 MG PO TABS: 150.0000 mg | ORAL_TABLET | Freq: Once | ORAL | 0 refills | Status: AC

## 2021-04-03 MED ORDER — METRONIDAZOLE 500 MG PO TABS: ORAL_TABLET | ORAL | 0 refills | Status: DC

## 2021-04-03 NOTE — Addendum Note (Signed)
Addended by: Dalphine Handing on: 04/03/2021 02:43 PM   Modules accepted: Orders

## 2021-04-03 NOTE — Progress Notes (Signed)
Pt seen +Trich results on Mychart and requests treatment. Pt also c/o yeast infections after Abx. Flagyl for Trich and Diflucan sent to the pharmacy per protocol.

## 2021-04-14 ENCOUNTER — Other Ambulatory Visit: Payer: Self-pay | Admitting: Obstetrics

## 2021-04-14 DIAGNOSIS — N760 Acute vaginitis: Secondary | ICD-10-CM

## 2021-04-14 MED ORDER — METRONIDAZOLE 500 MG PO TABS: 500.0000 mg | ORAL_TABLET | Freq: Two times a day (BID) | ORAL | 2 refills | Status: DC

## 2021-04-16 ENCOUNTER — Ambulatory Visit: Payer: BC Managed Care – PPO | Admitting: Physician Assistant

## 2021-05-02 DIAGNOSIS — G5600 Carpal tunnel syndrome, unspecified upper limb: Secondary | ICD-10-CM | POA: Diagnosis not present

## 2021-05-05 ENCOUNTER — Other Ambulatory Visit (HOSPITAL_COMMUNITY)
Admission: RE | Admit: 2021-05-05 | Discharge: 2021-05-05 | Disposition: A | Discharge status: Home / Self Care | Payer: BC Managed Care – PPO | Source: Ambulatory Visit | Attending: Obstetrics and Gynecology | Admitting: Obstetrics and Gynecology

## 2021-05-05 ENCOUNTER — Other Ambulatory Visit: Payer: Self-pay

## 2021-05-05 ENCOUNTER — Ambulatory Visit (INDEPENDENT_AMBULATORY_CARE_PROVIDER_SITE_OTHER): Payer: BC Managed Care – PPO

## 2021-05-05 VITALS — BP 135/74 | HR 72

## 2021-05-05 DIAGNOSIS — N898 Other specified noninflammatory disorders of vagina: Secondary | ICD-10-CM | POA: Insufficient documentation

## 2021-05-05 DIAGNOSIS — N7689 Other specified inflammation of vagina and vulva: Secondary | ICD-10-CM | POA: Diagnosis not present

## 2021-05-05 DIAGNOSIS — Z09 Encounter for follow-up examination after completed treatment for conditions other than malignant neoplasm: Secondary | ICD-10-CM | POA: Diagnosis not present

## 2021-05-05 DIAGNOSIS — G5621 Lesion of ulnar nerve, right upper limb: Secondary | ICD-10-CM | POA: Diagnosis not present

## 2021-05-05 NOTE — Progress Notes (Signed)
SUBJECTIVE:  35 y.o. female complains of vaginal discharge for a couple of days.   Denies abnormal vaginal bleeding or significant pelvic pain or fever. No UTI symptoms. Denies history of known exposure to STD.  No LMP recorded.  OBJECTIVE:  She appears well, afebrile. Urine dipstick: not done.  ASSESSMENT:  Vaginal Discharge: none  Vaginal Odor: none   PLAN:  GC, chlamydia, trichomonas, BVAG, CVAG probe sent to lab. Patient requested to have HIV and RPR testing as well. Treatment: To be determined once lab results are received ROV prn if symptoms persist or worsen.

## 2021-05-06 LAB — CERVICOVAGINAL ANCILLARY ONLY
Bacterial Vaginitis (gardnerella): NEGATIVE
Candida Glabrata: NEGATIVE
Candida Vaginitis: POSITIVE — AB
Chlamydia: NEGATIVE
Comment: NEGATIVE
Comment: NEGATIVE
Comment: NEGATIVE
Comment: NEGATIVE
Comment: NEGATIVE
Comment: NORMAL
Neisseria Gonorrhea: NEGATIVE
Trichomonas: NEGATIVE

## 2021-05-06 LAB — RPR: RPR Ser Ql: NONREACTIVE

## 2021-05-06 LAB — HIV ANTIBODY (ROUTINE TESTING W REFLEX): HIV Screen 4th Generation wRfx: NONREACTIVE

## 2021-05-07 ENCOUNTER — Other Ambulatory Visit: Payer: Self-pay | Admitting: Obstetrics

## 2021-05-07 DIAGNOSIS — B373 Candidiasis of vulva and vagina: Secondary | ICD-10-CM

## 2021-05-07 DIAGNOSIS — B3731 Acute candidiasis of vulva and vagina: Secondary | ICD-10-CM

## 2021-05-07 MED ORDER — FLUCONAZOLE 150 MG PO TABS: 150.0000 mg | ORAL_TABLET | Freq: Once | ORAL | 0 refills | Status: AC

## 2021-05-10 DIAGNOSIS — F4320 Adjustment disorder, unspecified: Secondary | ICD-10-CM | POA: Diagnosis not present

## 2021-06-18 ENCOUNTER — Ambulatory Visit: Payer: BC Managed Care – PPO | Admitting: Physician Assistant

## 2021-09-25 ENCOUNTER — Ambulatory Visit: Payer: BC Managed Care – PPO | Admitting: Physician Assistant

## 2022-01-05 ENCOUNTER — Encounter: Payer: Self-pay | Admitting: Obstetrics

## 2022-01-06 DIAGNOSIS — L02425 Furuncle of right lower limb: Secondary | ICD-10-CM | POA: Diagnosis not present

## 2022-01-06 DIAGNOSIS — L02222 Furuncle of back [any part, except buttock]: Secondary | ICD-10-CM | POA: Diagnosis not present

## 2022-01-14 ENCOUNTER — Emergency Department (HOSPITAL_COMMUNITY)
Admission: EM | Admit: 2022-01-14 | Discharge: 2022-01-14 | Disposition: A | Discharge status: Home / Self Care | Payer: BC Managed Care – PPO | Attending: Emergency Medicine | Admitting: Emergency Medicine

## 2022-01-14 ENCOUNTER — Other Ambulatory Visit: Payer: Self-pay

## 2022-01-14 ENCOUNTER — Encounter (HOSPITAL_COMMUNITY): Payer: Self-pay | Admitting: Emergency Medicine

## 2022-01-14 DIAGNOSIS — L0291 Cutaneous abscess, unspecified: Secondary | ICD-10-CM

## 2022-01-14 DIAGNOSIS — L02212 Cutaneous abscess of back [any part, except buttock]: Secondary | ICD-10-CM | POA: Insufficient documentation

## 2022-01-14 MED ORDER — NAPROXEN 500 MG PO TABS
500.0000 mg | ORAL_TABLET | Freq: Two times a day (BID) | ORAL | 0 refills | Status: DC
Start: 2022-01-14 — End: 2022-03-19

## 2022-01-14 MED ORDER — DOXYCYCLINE HYCLATE 100 MG PO CAPS: 100.0000 mg | ORAL_CAPSULE | Freq: Two times a day (BID) | ORAL | 0 refills | Status: AC

## 2022-01-14 MED ORDER — LIDOCAINE-EPINEPHRINE (PF) 2 %-1:200000 IJ SOLN
10.0000 mL | Freq: Once | INTRAMUSCULAR | Status: AC
  Administered 2022-01-14: 10 mL via INTRADERMAL
  Filled 2022-01-14: qty 20

## 2022-01-14 MED ORDER — LIDOCAINE HCL 2 % IJ SOLN
10.0000 mL | Freq: Once | INTRAMUSCULAR | Status: DC
Start: 2022-01-14 — End: 2022-01-14

## 2022-01-14 NOTE — ED Provider Notes (Signed)
?[facility]-EMERGENCY DEPT ?Provider Note ? ? ?CSN: [id number] ?Arrival date & time: 01/14/22  1223 ? ?  ? ?History ? ?Chief Complaint  ?Patient presents with  ? Abscess  ? ? ?Marisa Yu is a 36 y.o. female with chief complaint of left mid back abscess.  Noticed it 1 week ago, went to urgent care, was put on doxycycline.  Began day 8 of doxycycline today, has noticed the abscess has neither increased nor decreased in size.  Says the pain affects her sleep and she is tired of the pain.  Notes it is very tender to the touch.  Denies fever, erythema, or evidence of spreading infection.  Denies drainage from the area. ? ?The history is provided by the patient and medical records.  ?Abscess ? ?  ? ?Home Medications ?Prior to Admission medications   ?Medication Sig Start Date End Date Taking? Authorizing Provider  ?doxycycline (VIBRAMYCIN) 100 MG capsule Take 1 capsule (100 mg total) by mouth 2 (two) times daily for 5 days. One po bid x 7 days 01/17/22 01/22/22 Yes Cecil Cobbs, PA-C  ?naproxen (NAPROSYN) 500 MG tablet Take 1 tablet (500 mg total) by mouth 2 (two) times daily. 01/14/22  Yes Cecil Cobbs, PA-C  ?Norethindrone Acetate-Ethinyl Estrad-FE (LOESTRIN 24 FE) 1-20 MG-MCG(24) tablet Take 1 tablet by mouth daily. 11/28/18   Brock Bad, MD  ?phenazopyridine (PYRIDIUM) 200 MG tablet Take 1 tablet (200 mg total) by mouth 3 (three) times daily with meals. ?Patient not taking: Reported on 11/28/2018 11/10/18   Brock Bad, MD  ?phentermine 37.5 MG capsule Take 1 capsule (37.5 mg total) by mouth every morning. 11/28/18   Brock Bad, MD  ?tinidazole (TINDAMAX) 500 MG tablet Take 2 tablets (1,000 mg total) by mouth daily with breakfast. ?Patient not taking: No sig reported 07/04/20   Brock Bad, MD  ?   ? ?Allergies    ?Codeine   ? ?Review of Systems   ?Review of Systems  ?Skin:   ?     Abscess on the left mid back  ? ?Physical Exam ?Updated Vital Signs ?BP (!)  167/101 (BP Location: Left Arm)   Pulse 78   Temp 98.1 ?F (36.7 ?C) (Oral)   Resp 18   SpO2 95%  ?Physical Exam ?Vitals and nursing note reviewed.  ?Constitutional:   ?   General: She is not in acute distress. ?   Appearance: She is well-developed.  ?HENT:  ?   Head: Normocephalic and atraumatic.  ?Eyes:  ?   Conjunctiva/sclera: Conjunctivae normal.  ?Cardiovascular:  ?   Rate and Rhythm: Normal rate and regular rhythm.  ?   Pulses: Normal pulses.  ?   Heart sounds: No murmur heard. ?Pulmonary:  ?   Effort: Pulmonary effort is normal. No respiratory distress.  ?   Breath sounds: Normal breath sounds.  ?Abdominal:  ?   Palpations: Abdomen is soft.  ?   Tenderness: There is no abdominal tenderness.  ?Musculoskeletal:     ?   General: No swelling.  ?   Cervical back: Neck supple.  ?Skin: ?   General: Skin is warm and dry.  ?   Capillary Refill: Capillary refill takes less than 2 seconds.  ?   Findings: Abscess present.  ? ?    ?   Comments: 2 cm abscess of the left mid back as indicated above.  Appreciated fluctuance.  Without significant evidence of surrounding erythema.  Without drainage: purulent  or hemorrhagic.  Appears tender to palpation.  With a palpable firm surrounding fluctuant area.  Does not appear vesicular or ulcerated.  ?Neurological:  ?   Mental Status: She is alert and oriented to person, place, and time.  ?Psychiatric:     ?   Mood and Affect: Mood normal.  ? ? ?ED Results / Procedures / Treatments   ?Labs ?(all labs ordered are listed, but only abnormal results are displayed) ?Labs Reviewed - No data to display ? ?EKG ?None ? ?Radiology ?No results found. ? ?Procedures ?Marland Kitchen.Incision and Drainage ? ?Date/Time: 01/14/2022 11:15 PM ?Performed by: Prince Rome, PA-C ?Authorized by: Prince Rome, PA-C  ? ?Consent:  ?  Consent obtained:  Verbal ?  Consent given by:  Patient ?  Risks, benefits, and alternatives were discussed: yes   ?  Risks discussed:  Bleeding, incomplete drainage, pain and  damage to other organs ?  Alternatives discussed:  No treatment, delayed treatment and referral ?Universal protocol:  ?  Procedure explained and questions answered to patient or proxy's satisfaction: yes   ?  Relevant documents present and verified: yes   ?  Immediately prior to procedure, a time out was called: yes   ?  Patient identity confirmed:  Verbally with patient and arm band ?Location:  ?  Type:  Abscess ?  Size:  3 cm ?  Location:  Trunk ?  Trunk location:  Back ?Pre-procedure details:  ?  Skin preparation:  Betadine ?Sedation:  ?  Sedation type:  None ?Anesthesia:  ?  Anesthesia method:  Local infiltration ?  Local anesthetic:  Lidocaine 1% WITH epi ?Procedure type:  ?  Complexity:  Complex ?Procedure details:  ?  Incision types:  Single straight ?  Incision depth:  Subcutaneous ?  Wound management:  Probed and deloculated, irrigated with saline and extensive cleaning ?  Drainage:  Purulent ?  Drainage amount:  Moderate ?  Wound treatment:  Wound left open ?  Packing materials:  1/4 in iodoform gauze ?Post-procedure details:  ?  Procedure completion:  Tolerated well, no immediate complications ?Comments:  ?   Pt noted improvement and relief of pain following procedure.    ? ? ?Medications Ordered in ED ?Medications  ?lidocaine-EPINEPHrine (XYLOCAINE W/EPI) 2 %-1:200000 (PF) injection 10 mL (10 mLs Intradermal Given by Other 01/14/22 1717)  ? ? ?ED Course/ Medical Decision Making/ A&P ?  ?                        ?Medical Decision Making ?Amount and/or Complexity of Data Reviewed ?External Data Reviewed: notes. ?Labs:  Decision-making details documented in ED Course. ?Radiology:  Decision-making details documented in ED Course. ?ECG/medicine tests:  Decision-making details documented in ED Course. ? ?Risk ?OTC drugs. ?Prescription drug management. ? ? ?36 y.o. female presents to the ED for concern of Abscess ? Marland Kitchen  This involves an extensive number of treatment options, and is a complaint that carries with it  a high risk of complications and morbidity.  The emergent differential diagnosis prior to evaluation includes, but is not limited to: abscess, cellulitis, folliculitis, rash, lipoma ? ?This is not an exhaustive differential.  ? ?Past Medical History / Co-morbidities / Social History: ?HTN, anemia, hypothyroidism, former tobacco use ? ?Additional History:  ?Internal and external records from outside source obtained and reviewed including urgent care prior visits ? ?Physical Exam: ?Physical exam performed. The pertinent findings include: 2 cm abscess of the left mid  back as indicated above.  Appreciated fluctuance.  Without significant evidence of surrounding erythema.  Without drainage: purulent or hemorrhagic.  Appears tender to palpation.  With a palpable firm surrounding fluctuant area.  Does not appear vesicular or ulcerated.  Does not appear vesicular or ulcerated.  Able to communicate clearly and without difficulty.  No increased work of breathing.  Not tachycardic or febrile.  Overall remaining exam unremarkable. ? ?Lab Tests: ?None ? ?Imaging Studies: ?None ? ?Medications: ?I ordered medication including lidocaine for local anesthesia during abscess I&D.  Reevaluation of the patient after these medicines showed that the patient tolerated this well.  I have reviewed the patients home medicines and have made adjustments as needed ? ?Procedures: ?Patient underwent incision and drainage of abscess of the left back, described above in further detail.  Reevaluation of patient after this intervention showed that the patient tolerated this well without complications. ? ?ED Course/Disposition: ?Pt well-appearing on exam.  Overall exam unremarkable with the exception of a palpable and obvious abscess on the left mid back, near the mid-axillary line.  Fluctuance observed.  Hx of recurrent abscesses on monthly basis pt noticed correlates with her menstrual cycle.  Not suspicious of folliculitis or lipoma based on exam  and history.  No evidence of rash or foreign body object.  Obvious tenderness on exam.  Currently taking doxycycline and has been for the last week.  No improvement, but no worsening of the abscess.  Patient

## 2022-01-14 NOTE — ED Triage Notes (Signed)
Pt arrives POV w/ c/o abscess on left back. Pt reports antibiotics not working  ?

## 2022-01-14 NOTE — ED Notes (Signed)
I provided reinforced discharge education based off of discharge instructions. Pt acknowledged and understood my education. Pt had no further questions/concerns for provider/myself.  °

## 2022-01-14 NOTE — Discharge Instructions (Addendum)
You have been provided a continuation for your doxycycline antibiotic.  Please continue for five more days following your current therapy, for a total of 15 days.  Please take with food and plenty of water. ? ?You have also been provided with a prescription for an anti-inflammatory for pain management.  You may take 1 tablet every 12 hours as needed for pain.  Please take with food and plenty of water. ? ?A general surgeon's contact information has been provided as well by the name of Dr. Carolynne Edouardoth.  If you have recurrent abscesses, please call and schedule an appointment for consultation and possible treatment as discussed. ? ?Below is a list of providers in the area.  Please call and schedule an appointment for follow up.  Packing may be removed in 3-4 days after re-evaluation.  Return sooner to the ED for new or worsening symptoms as discussed.  If you are unable to schedule a follow up appointment with a PCP, please return to an urgent care or the ED for re-evaluation and treatment.   ? ?Return to the ED for new or worsening symptoms ? ?If you do not have a doctor see the list below. ? ?RESOURCE GUIDE ? ?Chronic Pain Problems: ?Contact [facility]  [phone] ?Patients need to be referred by their primary care doctor. ? ?Insufficient Money for Medicine: ?Contact United Way:  call "211" or [facility] [phone]. ? ?No Primary Care Doctor: ?Call Health Connect  [phone] - can help you locate a primary care doctor that  accepts your insurance, provides certain services, etc. ?Physician Referral Service- [phone] ?Agencies that provide inexpensive medical care: ?[facility]  [phone] ?[facility]  [phone] ?[facility]  [phone] ?[facility] Clinic  [phone] ?Planned Parenthood  [phone] ?[facility]  [phone] ? ?Medicaid-accepting [facility] Providers: ?[facility]- [address], [address] ? [phone],  Mon-Fri 9am-7pm, Sat 9am-1pm ?[facility]- [address], [place] [address] ? [phone] ?[facility]- [address], [address] ? [phone] ?[facility]- [address] [place] [address] ? [phone] ?Renaye RakersVeita Bland- [address], [phone] ? Only accepts [place] Access Medicaid patients after they have their name  applied to their card ? ?Self Pay (no insurance) in [facility]: ?Sickle Cell Patients: Dr Willey BladeEric Dean, [facility] ? [address], [phone] ?[facility]- [address] ? [phone] ?      Redge Gainer-     [facility]- [address] ?      -     [facility]- see information above (Speak to CitigroupPam H if you do not have insurance) ?      -  [facility]- [address], [phone] ?      -  [facility]- [address],  [phone] ?      -  [facility]- [address] [place] [address], [phone] ?      -  Dr Julio Sickssei-Bonsu-  [address], [address], [place], [phone] ?      -  [facility]- [address], [phone] ?      -  [facility] [place]- [address] [place] [address], [phone], also [address], [phone] ?      -    [phone] [phone], 1st & 3rd Saturday   every month, 10am-1pm ? ?1) Find a Doctor and Pay Out of Pocket ?Although you won't have to find out who is covered by your insurance plan, it is a good idea to ask around and get recommendations. You will then need to call the office and see if the doctor you have chosen will accept you as a new patient and what types of options they offer for patients who are self-pay. Some doctors offer discounts or will set up payment plans for their patients who do not have insurance, but you will need to ask so you aren't surprised when you get to your appointment. ? ?2) Contact Your Local Health Department ?Not all health departments have doctors that can see  patients for sick visits, but many do, so it is worth a call to see if yours does. If you don't know where your local health department is, you can check in your phone book. The CDC also has a tool to help you locate your state's health department, and many state websites also have listings of all of their local health departments. ? ?3) Find a Walk-in Clinic ?If your illness is not likely to be very severe or complicated, you may want to try a walk in clinic. These are popping up all over the country in pharmacies, drugstores, and shopping centers. They're usually staffed by nurse practitioners or physician assistants that have been trained to treat common illnesses and complaints. They're usually fairly quick and inexpensive. However, if you have serious medical issues or chronic medical problems, these are probably not your best option ? ?STD Testing ?[facility] Department of [facility], STD Clinic, [address] [place], phone [phone] or [phone].  Monday - Friday, call for an appointment. ?[facility] Department of [employer], STD Clinic, [place] [address], [place], phone [phone] or [phone].  Monday - Friday, call for an appointment. ? ?Abuse/Neglect: ?[facility] Child Abuse Hotline [phone] ?[facility] Child Abuse Hotline 719-364-0124 (After Hours) ? ?Emergency Shelter:  [facility] 803-216-1623 ? ?Maternity Homes: ?[facility] [phone] ?[employer] [facility] 208-800-8207 ? ?MRSA Hotline #:   812-7517 ? ?[facility] Resources ? ?[facility]     United Way                          [facility] Dept. ?[address] [place]                       [address]      [address] [place] [address]  ?[place]                                                Cristobal Goldmann ?Phone:  [phone]                                    Phone:  [phone]  Phone:  [phone] ? ?[facility], [phone] ?[facility]- [phone] ?      -     [facility] in [place], [address]                                  [phone], Insurance ? ?[facility] Child Abuse Hotline ?(4848236946 or [phone] (After Hours) ? ?Behavioral Health Services ? ?Substance Abuse Resources: ?[facility]  336-507-7811 ?[facility] [phone] ?[facility] [phone] ?[facility] [phone] ?Residential & Outpatient Substance Abuse Program  5043433902 ? ?Psychological Services: ?[facility]  ([phone] ?[employer]  ([phone] ?[facility] [place] [address], [place], ACCESS LINE: [phone])[phone] or [phone], [employer]ur[employer]an.co.za ? ?Dental Assistance ? ?Patients with Medicaid: ?[facility]                     [facility] ?[phone] [address]                                           [phone] [address] ?Phone:  [phone]                                                  Phone:  [phone] ? ?If unable to pay or uninsured, contact:  [facility] or [facility]. to become qualified for the adult dental clinic. ? ?Patients with Medicaid: [facility] ?[phone] [address], [phone] ?[address], [phone] ? ?If unable to pay, or uninsured, contact [facility] [phone]) or [facility] Department [phone] in [place], [phone] in [facility]) to become qualified for the adult dental clinic ? ?Other Proofreader Services: ?[facility]- [address] [place], [place], [place], [address], [phone], Ext. [phone], 2nd and 4th Thursday of the month at 6:30am.  10 clients each day by appointment, can sometimes see walk-in patients if someone does not show for an  appointment. ?[facility]- [address] [place], [place], [address], [phone] ?[facility]- [address], [place], [place], [address], [phone] ?[facility] [phone]

## 2022-01-15 ENCOUNTER — Ambulatory Visit: Payer: BC Managed Care – PPO | Admitting: Obstetrics

## 2022-01-21 DIAGNOSIS — L02222 Furuncle of back [any part, except buttock]: Secondary | ICD-10-CM | POA: Diagnosis not present

## 2022-01-24 DIAGNOSIS — L02222 Furuncle of back [any part, except buttock]: Secondary | ICD-10-CM | POA: Diagnosis not present

## 2022-03-19 ENCOUNTER — Ambulatory Visit (INDEPENDENT_AMBULATORY_CARE_PROVIDER_SITE_OTHER): Payer: BC Managed Care – PPO | Admitting: Obstetrics and Gynecology

## 2022-03-19 ENCOUNTER — Other Ambulatory Visit (HOSPITAL_COMMUNITY)
Admission: RE | Admit: 2022-03-19 | Discharge: 2022-03-19 | Disposition: A | Discharge status: Home / Self Care | Payer: BC Managed Care – PPO | Source: Ambulatory Visit | Attending: Obstetrics | Admitting: Obstetrics

## 2022-03-19 ENCOUNTER — Encounter: Payer: Self-pay | Admitting: Obstetrics and Gynecology

## 2022-03-19 VITALS — BP 136/90 | HR 75 | Ht 65.0 in | Wt 241.0 lb

## 2022-03-19 DIAGNOSIS — Z202 Contact with and (suspected) exposure to infections with a predominantly sexual mode of transmission: Secondary | ICD-10-CM | POA: Insufficient documentation

## 2022-03-19 DIAGNOSIS — Z803 Family history of malignant neoplasm of breast: Secondary | ICD-10-CM | POA: Diagnosis not present

## 2022-03-19 DIAGNOSIS — Z01419 Encounter for gynecological examination (general) (routine) without abnormal findings: Secondary | ICD-10-CM | POA: Diagnosis not present

## 2022-03-19 NOTE — Progress Notes (Signed)
Pt presents for annual exam. Last PAP 06/2020. Pt requesting PAP and STI testing. Pt requesting mammogram due to family history of breast cancer.

## 2022-03-19 NOTE — Progress Notes (Signed)
Marisa Yu is a 36 y.o. [phone] female here for a routine annual gynecologic exam.  Current complaints: Desires mammogram due to [place] of breast cancer.   Denies abnormal vaginal bleeding, discharge, pelvic pain, problems with intercourse or other gynecologic concerns.    Gynecologic History Patient's last menstrual period was 02/10/2022. Contraception: tubal ligation Last Pap: /102021. Results were: normal Last mammogram: NA.   Obstetric History OB History  Gravida Para Term Preterm AB Living  '3 3 3 ' 0 0 3  SAB IAB Ectopic Multiple Live Births  0 0 0 0 3    # Outcome Date GA Lbr Len/2nd Weight Sex Delivery Anes PTL Lv  3 Term 08/21/15 [redacted]w[redacted]d 6 lb 14.9 oz (3.145 kg) M CS-LVertical Spinal  LIV  2 Term 02/08/08 445w1d8 lb (3.629 kg) F CS-LTranv EPI N LIV  1 Term 11/14/02 4030w0d lb 13 oz (3.544 kg) M CS-LTranv EPI N LIV     Complications: Fetal Intolerance    Past Medical History:  Diagnosis Date   Anemia    Boil, arm    Family history of BRCA1 gene positive 01/18/2018   Family history of breast cancer    Headache    Hypertension    Hypothyroidism    Tooth ache    Vaginal Pap smear, abnormal     Past Surgical History:  Procedure Laterality Date   CESAREAN SECTION  2004   CESAREAN SECTION  2009   CESAREAN SECTION WITH BILATERAL TUBAL LIGATION Bilateral 08/21/2015   Procedure: CESAREAN SECTION WITH BILATERAL TUBAL LIGATION;  Surgeon: ChaShelly BombardD;  [facility];  Service: Obstetrics;  Laterality: Bilateral;   CYST EXCISION     WISDOM TOOTH EXTRACTION      No current outpatient medications on file prior to visit.   No current facility-administered medications on file prior to visit.    Allergies  Allergen Reactions   Codeine Itching    Social History   Socioeconomic History   Marital status: Single    Spouse name: Not on file   Number of children: 2   Years of education: Not on file   Highest education level: Not on file  Occupational  History   Occupation: Childcare    Employer: [employer] Use   Smoking status: Former    Types: Cigarettes    Quit date: 09/22/2011    Years since quitting: 10.4   Smokeless tobacco: Never  Vaping Use   Vaping Use: Never used  Substance and Sexual Activity   Alcohol use: Yes    Comment: occ   Drug use: No   Sexual activity: Not Currently    Partners: Male    Birth control/protection: None  Other Topics Concern   Not on file  Social History Narrative   Not on file   Social Determinants of Health   Financial Resource Strain: Not on file  Food Insecurity: Not on file  Transportation Needs: Not on file  Physical Activity: Not on file  Stress: Not on file  Social Connections: Not on file  Intimate Partner Violence: Not on file    Family History  Problem Relation Age of Onset   Breast cancer Maternal Aunt        dx 40's   Breast cancer Maternal Aunt        dx 40's   Breast cancer Maternal Grandmother    Heart disease Maternal Grandfather    Breast cancer Cousin [phone]  BRCA1 +   Other Cousin        BRCA1 +    The following portions of the patient's history were reviewed and updated as appropriate: allergies, current medications, past family history, past medical history, past social history, past surgical history and problem list.  Review of Systems Pertinent items noted in HPI and remainder of comprehensive ROS otherwise negative.   Objective:  BP 136/90   Pulse 75   Ht '5\' 5"'  (1.651 m)   Wt 241 lb (109.3 kg)   LMP 02/10/2022   BMI 40.10 kg/m  CONSTITUTIONAL: Well-developed, well-nourished female in no acute distress.  HENT:  Normocephalic, atraumatic, External right and left ear normal. Oropharynx is clear and moist EYES: Conjunctivae and EOM are normal. Pupils are equal, round, and reactive to light. No scleral icterus.  NECK: Normal range of motion, supple, no masses.  Normal thyroid.  SKIN: Skin is warm and dry. No rash noted. Not diaphoretic. No  erythema. No pallor. [place]: Alert and oriented to person, place, and time. Normal reflexes, muscle tone coordination. No cranial nerve deficit noted. PSYCHIATRIC: Normal mood and affect. Normal behavior. Normal judgment and thought content. CARDIOVASCULAR: Normal heart rate noted, regular rhythm RESPIRATORY: Clear to auscultation bilaterally. Effort and breath sounds normal, no problems with respiration noted. BREASTS: Symmetric in size. No masses, skin changes, nipple drainage, or lymphadenopathy. ABDOMEN: Soft, normal bowel sounds, no distention noted.  No tenderness, rebound or guarding.  PELVIC: Normal appearing external genitalia; normal appearing vaginal mucosa and cervix.  No abnormal discharge noted.  Pap smear obtained.  Normal uterine size, no other palpable masses, no uterine or adnexal tenderness. MUSCULOSKELETAL: Normal range of motion. No tenderness.  No cyanosis, clubbing, or edema.  2+ distal pulses.   Assessment:  Annual gynecologic examination with pap smear FH of breast Cancer STD testing Plan:  Mammogram scheduled. STD testing as per pt request Routine preventative health maintenance measures emphasized. Please refer to After Visit Summary for other counseling recommendations.    Chancy Milroy, MD, [place] Attending [place] for [facility], [place]

## 2022-03-19 NOTE — Patient Instructions (Signed)

## 2022-03-20 LAB — CERVICOVAGINAL ANCILLARY ONLY
Bacterial Vaginitis (gardnerella): NEGATIVE
Candida Glabrata: NEGATIVE
Candida Vaginitis: NEGATIVE
Chlamydia: NEGATIVE
Comment: NEGATIVE
Comment: NEGATIVE
Comment: NEGATIVE
Comment: NEGATIVE
Comment: NEGATIVE
Comment: NORMAL
Neisseria Gonorrhea: NEGATIVE
Trichomonas: NEGATIVE

## 2022-03-20 LAB — HEPATITIS B SURFACE ANTIGEN: Hepatitis B Surface Ag: NEGATIVE

## 2022-03-20 LAB — RPR: RPR Ser Ql: NONREACTIVE

## 2022-03-20 LAB — HEPATITIS C ANTIBODY: Hep C Virus Ab: NONREACTIVE

## 2022-03-20 LAB — HIV ANTIBODY (ROUTINE TESTING W REFLEX): HIV Screen 4th Generation wRfx: NONREACTIVE

## 2022-03-30 ENCOUNTER — Encounter: Payer: Self-pay | Admitting: Family Medicine

## 2022-03-30 ENCOUNTER — Ambulatory Visit (INDEPENDENT_AMBULATORY_CARE_PROVIDER_SITE_OTHER): Payer: BC Managed Care – PPO | Admitting: Family Medicine

## 2022-03-30 VITALS — BP 132/86 | HR 81 | Temp 98.1°F | Resp 16 | Ht 65.0 in | Wt 239.0 lb

## 2022-03-30 DIAGNOSIS — Z0001 Encounter for general adult medical examination with abnormal findings: Secondary | ICD-10-CM

## 2022-03-30 DIAGNOSIS — L0291 Cutaneous abscess, unspecified: Secondary | ICD-10-CM | POA: Diagnosis not present

## 2022-03-30 DIAGNOSIS — Z130 Encounter for screening for diseases of the blood and blood-forming organs and certain disorders involving the immune mechanism: Secondary | ICD-10-CM

## 2022-03-30 DIAGNOSIS — Z7689 Persons encountering health services in other specified circumstances: Secondary | ICD-10-CM

## 2022-03-30 DIAGNOSIS — Z0000 Encounter for general adult medical examination without abnormal findings: Secondary | ICD-10-CM | POA: Diagnosis not present

## 2022-03-30 DIAGNOSIS — Z1329 Encounter for screening for other suspected endocrine disorder: Secondary | ICD-10-CM | POA: Diagnosis not present

## 2022-03-30 DIAGNOSIS — Z13220 Encounter for screening for lipoid disorders: Secondary | ICD-10-CM | POA: Diagnosis not present

## 2022-03-30 MED ORDER — CEPHALEXIN 500 MG PO CAPS: 500.0000 mg | ORAL_CAPSULE | Freq: Three times a day (TID) | ORAL | 0 refills | Status: DC

## 2022-03-30 NOTE — Progress Notes (Unsigned)
Patient is here to established care with pcp Patient had a few concerns  Right knee pain Back pain Heart flutters

## 2022-03-30 NOTE — Progress Notes (Unsigned)
New Patient Office Visit  Subjective    Patient ID: Marisa Yu, female    DOB: [birth year]  Age: 36 y.o. MRN: [id number]  CC:  Chief Complaint  Patient presents with   Annual Exam    HPI Marisa Yu presents to establish care and for routine annual exam.    Outpatient Encounter Medications as of 03/30/2022  Medication Sig   cephALEXin (KEFLEX) 500 MG capsule Take 1 capsule (500 mg total) by mouth 3 (three) times daily.   No facility-administered encounter medications on file as of 03/30/2022.    Past Medical History:  Diagnosis Date   Anemia    Anxiety    Boil, arm    Family history of BRCA1 gene positive 01/18/2018   Family history of breast cancer    GERD (gastroesophageal reflux disease)    Headache    Hypertension    Hypothyroidism    Tooth ache    Vaginal Pap smear, abnormal     Past Surgical History:  Procedure Laterality Date   CESAREAN SECTION  2004   CESAREAN SECTION  2009   CESAREAN SECTION WITH BILATERAL TUBAL LIGATION Bilateral 08/21/2015   Procedure: CESAREAN SECTION WITH BILATERAL TUBAL LIGATION;  Surgeon: Shelly Bombard, MD;  Location: [facility];  Service: Obstetrics;  Laterality: Bilateral;   CYST EXCISION     WISDOM TOOTH EXTRACTION      Family History  Problem Relation Age of Onset   Cancer Mother    Asthma Son    Breast cancer Maternal Aunt        dx 47's   Breast cancer Maternal Aunt        dx 40's   Breast cancer Maternal Grandmother    Heart disease Maternal Grandfather    Breast cancer Cousin 47       BRCA1 +   Other Cousin        BRCA1 +    Social History   Socioeconomic History   Marital status: Single    Spouse name: Not on file   Number of children: 2   Years of education: Not on file   Highest education level: Not on file  Occupational History   Occupation: Childcare    Employer: [id number]  Tobacco Use   Smoking status: Former    Types: Cigarettes    Quit date: 09/22/2011    Years since  quitting: 10.5   Smokeless tobacco: Never  Vaping Use   Vaping Use: Never used  Substance and Sexual Activity   Alcohol use: Yes    Comment: occ   Drug use: No   Sexual activity: Not Currently    Partners: Male    Birth control/protection: None  Other Topics Concern   Not on file  Social History Narrative   Not on file   Social Determinants of Health   Financial Resource Strain: Not on file  Food Insecurity: Not on file  Transportation Needs: Not on file  Physical Activity: Not on file  Stress: Not on file  Social Connections: Not on file  Intimate Partner Violence: Not on file    Review of Systems  All other systems reviewed and are negative.       Objective    BP 132/86   Pulse 81   Temp 98.1 F (36.7 C) (Oral)   Resp 16   Ht '5\' 5"'  (1.651 m)   Wt 239 lb (108.4 kg)   LMP 02/10/2022   SpO2 98%   BMI 39.77  kg/m   Physical Exam Vitals and nursing note reviewed.  Constitutional:      General: She is not in acute distress.    Appearance: She is obese.  HENT:     Head: Normocephalic and atraumatic.     Right Ear: Tympanic membrane, ear canal and external ear normal.     Left Ear: Tympanic membrane, ear canal and external ear normal.     Nose: Nose normal.     Mouth/Throat:     Mouth: Mucous membranes are moist.     Pharynx: Oropharynx is clear.  Eyes:     Conjunctiva/sclera: Conjunctivae normal.     Pupils: Pupils are equal, round, and reactive to light.  Neck:     Thyroid: No thyromegaly.  Cardiovascular:     Rate and Rhythm: Normal rate and regular rhythm.     Heart sounds: Normal heart sounds. No murmur heard. Pulmonary:     Effort: Pulmonary effort is normal. No respiratory distress.     Breath sounds: Normal breath sounds.  Abdominal:     General: There is no distension.     Palpations: Abdomen is soft. There is no mass.     Tenderness: There is no abdominal tenderness.  Musculoskeletal:        General: Normal range of motion.     Cervical  back: Normal range of motion and neck supple.  Skin:    General: Skin is warm and dry.     Findings: Abscess (under left breast) present.  Neurological:     General: No focal deficit present.     Mental Status: She is alert and oriented to person, place, and time.  Psychiatric:        Mood and Affect: Mood normal.        Behavior: Behavior normal.     {Labs (Optional):23779}    Assessment & Plan:   1. Annual physical exam *** - CMP14+EGFR  2. Screening for endocrine/metabolic/immunity disorders *** - TSH - Hemoglobin A1c  3. Screening for lipid disorders *** - Lipid Panel  4. Screening for deficiency anemia *** - CBC with Differential  5. Abscess ? 2/2 HAS Keflex prescribed.   6. Encounter to establish care     Return in about 1 year (around 03/31/2023) for physical.   Becky Sax, MD

## 2022-03-31 DIAGNOSIS — F4310 Post-traumatic stress disorder, unspecified: Secondary | ICD-10-CM | POA: Diagnosis not present

## 2022-03-31 LAB — LIPID PANEL
Chol/HDL Ratio: 3 ratio (ref 0.0–4.4)
Cholesterol, Total: 163 mg/dL (ref 100–199)
HDL: 54 mg/dL (ref >39)
LDL Chol Calc (NIH): 94 mg/dL (ref 0–99)
Triglycerides: 82 mg/dL (ref 0–149)
VLDL Cholesterol Cal: 15 mg/dL (ref 5–40)

## 2022-03-31 LAB — CMP14+EGFR
ALT: 13 IU/L (ref 0–32)
AST: 19 IU/L (ref 0–40)
Albumin/Globulin Ratio: 1.2 (ref 1.2–2.2)
Albumin: 4 g/dL (ref 3.9–4.9)
Alkaline Phosphatase: 67 IU/L (ref 44–121)
BUN/Creatinine Ratio: 13 (ref 9–23)
BUN: 9 mg/dL (ref 6–20)
Bilirubin Total: 0.6 mg/dL (ref 0.0–1.2)
CO2: 21 mmol/L (ref 20–29)
Calcium: 9.2 mg/dL (ref 8.7–10.2)
Chloride: 106 mmol/L (ref 96–106)
Creatinine, Ser: 0.69 mg/dL (ref 0.57–1.00)
Globulin, Total: 3.3 g/dL (ref 1.5–4.5)
Glucose: 88 mg/dL (ref 70–99)
Potassium: 3.9 mmol/L (ref 3.5–5.2)
Sodium: 141 mmol/L (ref 134–144)
Total Protein: 7.3 g/dL (ref 6.0–8.5)
eGFR: 115 mL/min/{1.73_m2} (ref >59)

## 2022-03-31 LAB — CBC WITH DIFFERENTIAL/PLATELET
Basophils Absolute: 0 10*3/uL (ref 0.0–0.2)
Basos: 1 %
EOS (ABSOLUTE): 0.1 10*3/uL (ref 0.0–0.4)
Eos: 2 %
Hematocrit: 35 % (ref 34.0–46.6)
Hemoglobin: 11.4 g/dL (ref 11.1–15.9)
Immature Grans (Abs): 0 10*3/uL (ref 0.0–0.1)
Immature Granulocytes: 0 %
Lymphocytes Absolute: 1.5 10*3/uL (ref 0.7–3.1)
Lymphs: 32 %
MCH: 25.6 pg — ABNORMAL LOW (ref 26.6–33.0)
MCHC: 32.6 g/dL (ref 31.5–35.7)
MCV: 79 fL (ref 79–97)
Monocytes Absolute: 0.3 10*3/uL (ref 0.1–0.9)
Monocytes: 6 %
Neutrophils Absolute: 2.8 10*3/uL (ref 1.4–7.0)
Neutrophils: 59 %
Platelets: 447 10*3/uL (ref 150–450)
RBC: 4.46 x10E6/uL (ref 3.77–5.28)
RDW: 14.6 % (ref 11.7–15.4)
WBC: 4.7 10*3/uL (ref 3.4–10.8)

## 2022-03-31 LAB — HEMOGLOBIN A1C
Est. average glucose Bld gHb Est-mCnc: 123 mg/dL
Hgb A1c MFr Bld: 5.9 % — ABNORMAL HIGH (ref 4.8–5.6)

## 2022-03-31 LAB — TSH: TSH: 0.681 u[IU]/mL (ref 0.450–4.500)

## 2022-04-02 ENCOUNTER — Encounter: Payer: Self-pay | Admitting: Family Medicine

## 2022-04-02 ENCOUNTER — Ambulatory Visit: Payer: BC Managed Care – PPO

## 2022-04-03 ENCOUNTER — Ambulatory Visit
Admission: RE | Admit: 2022-04-03 | Discharge: 2022-04-03 | Disposition: A | Discharge status: Home / Self Care | Payer: BC Managed Care – PPO | Source: Ambulatory Visit | Attending: Obstetrics and Gynecology | Admitting: Obstetrics and Gynecology

## 2022-04-03 DIAGNOSIS — Z803 Family history of malignant neoplasm of breast: Secondary | ICD-10-CM

## 2022-04-03 DIAGNOSIS — Z1231 Encounter for screening mammogram for malignant neoplasm of breast: Secondary | ICD-10-CM | POA: Diagnosis not present

## 2022-04-08 ENCOUNTER — Other Ambulatory Visit: Payer: Self-pay | Admitting: Family Medicine

## 2022-04-08 MED ORDER — CYCLOBENZAPRINE HCL 5 MG PO TABS: 5.0000 mg | ORAL_TABLET | Freq: Three times a day (TID) | ORAL | 1 refills | Status: DC | PRN

## 2022-05-07 ENCOUNTER — Encounter: Payer: Self-pay | Admitting: Obstetrics and Gynecology

## 2022-05-11 ENCOUNTER — Ambulatory Visit: Payer: BC Managed Care – PPO

## 2022-06-01 ENCOUNTER — Ambulatory Visit: Payer: BC Managed Care – PPO | Admitting: Family Medicine

## 2022-06-17 ENCOUNTER — Encounter: Payer: Self-pay | Admitting: Family Medicine

## 2022-06-17 ENCOUNTER — Ambulatory Visit: Payer: Self-pay | Admitting: *Deleted

## 2022-06-17 NOTE — Telephone Encounter (Signed)
  Chief Complaint: "Boil" Symptoms: Silver dollar size red raised "Boil" under left breast. Painful to touch 8/10 at times "Bra doesn't rub so not always that painful." Frequency: Sunday Pertinent Negatives: Patient denies drainage,fever Disposition: [] ED /[] Urgent Care (no appt availability in office) / [] Appointment(In office/virtual)/ []  [place] [facility]/ [] Home Care/ [] Refused Recommended Disposition /[] [place] [facility]/ [x]  Follow-up with PCP Additional Notes: States has had these in past. No availability within protocol time frame. Pt requesting antibiotics and med for yeast infection from antibiotics. States has sent [employer] and "I'll send a picture ." Pt was driving, bad connection, unable to provide care advise.  Reason for Disposition  Boil > 2 inches across (> 5 cm; larger than a golf ball or ping pong ball)  Answer Assessment - Initial Assessment Questions 1. APPEARANCE of BOIL: "What does the boil look like?"      Red raised 2. LOCATION: "Where is the boil located?"      Left breast, underneath 3. NUMBER: "How many boils are there?"      One 4. SIZE: "How big is the boil?" (e.g., inches, cm; compare to size of a coin or other object)     Silver dollar 5. ONSET: "When did the boil start?"     Sunday 6. PAIN: "Is there any pain?" If Yes, ask: "How bad is the pain?"   (Scale 1-10; or mild, moderate, severe)     8-9/10 7. FEVER: "Do you have a fever?" If Yes, ask: "What is it, how was it measured, and when did it start?"      No 8. SOURCE: "Have you been around anyone with boils or other Staph infections?" "Have you ever had boils before?"     Yes 9. OTHER SYMPTOMS: "Do you have any other symptoms?" (e.g., shaking chills, weakness, rash elsewhere on body)     No  Protocols used: Boil (Skin Abscess)-A-AH

## 2022-06-17 NOTE — Telephone Encounter (Signed)
Summary: boil underneat breast   Patient stated she sent 2 My Chart messages (chart does not reflect). Patient experiencing a very pain boil underneath her left breast. No available appointments     Attempted to call patient- left message to call office regarding symptom

## 2022-06-18 ENCOUNTER — Emergency Department (HOSPITAL_BASED_OUTPATIENT_CLINIC_OR_DEPARTMENT_OTHER)
Admission: EM | Admit: 2022-06-18 | Discharge: 2022-06-18 | Disposition: A | Discharge status: Home / Self Care | Payer: BC Managed Care – PPO | Attending: Emergency Medicine | Admitting: Emergency Medicine

## 2022-06-18 ENCOUNTER — Other Ambulatory Visit: Payer: Self-pay | Admitting: Family Medicine

## 2022-06-18 ENCOUNTER — Other Ambulatory Visit: Payer: Self-pay

## 2022-06-18 ENCOUNTER — Ambulatory Visit: Payer: Self-pay | Admitting: *Deleted

## 2022-06-18 DIAGNOSIS — N611 Abscess of the breast and nipple: Secondary | ICD-10-CM | POA: Diagnosis not present

## 2022-06-18 DIAGNOSIS — L0291 Cutaneous abscess, unspecified: Secondary | ICD-10-CM

## 2022-06-18 MED ORDER — MELOXICAM 7.5 MG PO TABS
7.5000 mg | ORAL_TABLET | Freq: Every day | ORAL | 0 refills | Status: DC
Start: 2022-06-18 — End: 2022-07-10

## 2022-06-18 MED ORDER — FLUCONAZOLE 150 MG PO TABS: 150.0000 mg | ORAL_TABLET | Freq: Every day | ORAL | 0 refills | Status: DC

## 2022-06-18 MED ORDER — SULFAMETHOXAZOLE-TRIMETHOPRIM 800-160 MG PO TABS
1.0000 | ORAL_TABLET | Freq: Two times a day (BID) | ORAL | 0 refills | Status: AC
Start: 2022-06-18 — End: 2022-06-25

## 2022-06-18 NOTE — ED Triage Notes (Signed)
Pt to ED c/o boil under left breast that appeared on Sunday. Redness noted. Reports hx boils.

## 2022-06-18 NOTE — Telephone Encounter (Signed)
  Chief Complaint: boil under left breast severe pain. Called in yesterday and sent My Chart message with pictures.  Symptoms: boill under left breast with worsening pain Frequency: na Pertinent Negatives: Patient denies na Disposition: [] ED /[x] Urgent Care (no appt availability in office) / [] Appointment(In office/virtual)/ []  [place] [facility]/ [] Home Care/ [] Refused Recommended Disposition /[] [place] [facility]/ []  Follow-up with PCP Additional Notes:   Recommended UC due to no available appt. Patient upset she can not be seen today by a nurse or anyone and will have to pay greater than $100 for UC visit. Requesting a call back on lunch break. Please advise. Attempted to contact [facility], no answer. Called during lunch hours. Patient requesting a call back asap.     .  Reason for Disposition  SEVERE pain (e.g., excruciating)  Answer Assessment - Initial Assessment Questions 1. APPEARANCE of BOIL: "What does the boil look like?"      Boil, skin abscess worsen pain 2. LOCATION: "Where is the boil located?"      Under  left breast 3. NUMBER: "How many boils are there?"      na 4. SIZE: "How big is the boil?" (e.g., inches, cm; compare to size of a coin or other object)     na 5. ONSET: "When did the boil start?"     na 6. PAIN: "Is there any pain?" If Yes, ask: "How bad is the pain?"   (Scale 1-10; or mild, moderate, severe)     10 7. FEVER: "Do you have a fever?" If Yes, ask: "What is it, how was it measured, and when did it start?"      na 8. SOURCE: "Have you been around anyone with boils or other Staph infections?" "Have you ever had boils before?"     na 9. OTHER SYMPTOMS: "Do you have any other symptoms?" (e.g., shaking chills, weakness, rash elsewhere on body)     Worsening pain 10. PREGNANCY: "Is there any chance you are pregnant?" "When was your last menstrual period?"       na  Protocols used: Boil (Skin Abscess)-A-AH

## 2022-06-18 NOTE — Telephone Encounter (Signed)
Patient was called back and we got disconnected. I believe patient said that she was the hospital as we were speaking.

## 2022-06-18 NOTE — Discharge Instructions (Addendum)
Please take the antibiotic I prescribed you in its entirety for management of your skin abscess.  I also recommend that you use warm compresses over the area for additional relief.  Follow-up with your primary care doctor for a wound check in the next few days.  If your symptoms do not improve or the area becomes softer then I recommend coming back for drainage.   Return if development of any new or worsening symptoms.

## 2022-06-18 NOTE — ED Provider Notes (Signed)
[facility] EMERGENCY DEPARTMENT Provider Note   CSN: [id number] Arrival date & time: 06/18/22  1449     History  Chief Complaint  Patient presents with   Abscess    Marisa Yu is a 36 y.o. female.  Patient with no pertinent past medical history presents today with complaints of abscess.  She states that she first noticed same on Sunday.  Same is located beneath her left breast right where her bra sits.  It has not been draining.  She denies any fevers or chills.  Endorses history of similar in the past but on her back which required drainage.  No interventions prior to arrival.  The history is provided by the patient. No language interpreter was used.  Abscess      Home Medications Prior to Admission medications   Medication Sig Start Date End Date Taking? Authorizing Provider  cephALEXin (KEFLEX) 500 MG capsule Take 1 capsule (500 mg total) by mouth 3 (three) times daily. 03/30/22   Georganna Skeans, MD  cyclobenzaprine (FLEXERIL) 5 MG tablet Take 1 tablet (5 mg total) by mouth 3 (three) times daily as needed for muscle spasms. 04/08/22   Georganna Skeans, MD      Allergies    Codeine    Review of Systems   Review of Systems  Skin:  Positive for wound.  All other systems reviewed and are negative.   Physical Exam Updated Vital Signs BP (!) 146/88 (BP Location: Right Arm)   Pulse 82   Temp 98.6 F (37 C) (Oral)   Resp 18   Ht 5\' 5"  (1.651 m)   Wt 104.3 kg   LMP 06/18/2022   SpO2 100%   BMI 38.27 kg/m  Physical Exam Vitals and nursing note reviewed.  Constitutional:      General: She is not in acute distress.    Appearance: Normal appearance. She is normal weight. She is not ill-appearing, toxic-appearing or diaphoretic.  HENT:     Head: Normocephalic and atraumatic.  Cardiovascular:     Rate and Rhythm: Normal rate.  Pulmonary:     Effort: Pulmonary effort is normal. No respiratory distress.  Musculoskeletal:        General: Normal  range of motion.     Cervical back: Normal range of motion.  Skin:    General: Skin is warm and dry.     Comments: 2 cm x 2 cm area of induration and tenderness noted underneath the left breast.  No erythema or warmth.  No drainage.  No fluctuance.  Neurological:     General: No focal deficit present.     Mental Status: She is alert.  Psychiatric:        Mood and Affect: Mood normal.        Behavior: Behavior normal.     ED Results / Procedures / Treatments   Labs (all labs ordered are listed, but only abnormal results are displayed) Labs Reviewed - No data to display  EKG None  Radiology No results found.  Procedures Procedures    Medications Ordered in ED Medications - No data to display  ED Course/ Medical Decision Making/ A&P                           Medical Decision Making  Patient presents today with complaints of skin abscess.  She is afebrile, nontoxic-appearing, no acute distress with reassuring vital signs.  Physical exam reveals area of induration without  any fluctuance.  Same visualized under ultrasound did not reveal any area that appears to be amenable to drainage.  I have discussed this with the patient and shared decision making implemented for management of same.  Decision made to defer I&D at this time and try management with antibiotics and warm compresses. Will give Bactrim for same as she states that she was on doxycycline for previous skin abscess and did not feel that it was effective.  Patient also requests Diflucan as she normally gets a yeast infection following antibiotics.  She is stable for discharge at this time.  Patient is understanding and amenable with plan, educated on red flag symptoms that would prompt immediate return.  Discharged in stable condition.  Final Clinical Impression(s) / ED Diagnoses Final diagnoses:  Abscess    Rx / DC Orders ED Discharge Orders          Ordered    sulfamethoxazole-trimethoprim (BACTRIM DS) 800-160 MG  tablet  2 times daily        06/18/22 1717    fluconazole (DIFLUCAN) 150 MG tablet  Daily        06/18/22 1717    meloxicam (MOBIC) 7.5 MG tablet  Daily        06/18/22 1719          An After Visit Summary was printed and given to the patient.     Nestor Lewandowsky 06/18/22 [place], [place], DO 06/18/22 2232

## 2022-06-18 NOTE — Telephone Encounter (Signed)
Pt called back. She wants to be seen today. PT is upset that her PCP is unable to fit her in.   Pt will go to UC.  Please advise.

## 2022-06-18 NOTE — Telephone Encounter (Signed)
Patient given appt for next business day  

## 2022-06-19 ENCOUNTER — Ambulatory Visit: Payer: BC Managed Care – PPO | Admitting: Family Medicine

## 2022-07-10 ENCOUNTER — Encounter: Payer: Self-pay | Admitting: Family Medicine

## 2022-07-10 ENCOUNTER — Ambulatory Visit (INDEPENDENT_AMBULATORY_CARE_PROVIDER_SITE_OTHER): Payer: BC Managed Care – PPO | Admitting: Family Medicine

## 2022-07-10 VITALS — BP 143/98 | HR 71 | Temp 98.1°F | Resp 16 | Wt 238.2 lb

## 2022-07-10 DIAGNOSIS — N62 Hypertrophy of breast: Secondary | ICD-10-CM | POA: Diagnosis not present

## 2022-07-10 DIAGNOSIS — I10 Essential (primary) hypertension: Secondary | ICD-10-CM

## 2022-07-10 DIAGNOSIS — Z6839 Body mass index (BMI) 39.0-39.9, adult: Secondary | ICD-10-CM

## 2022-07-10 DIAGNOSIS — M79601 Pain in right arm: Secondary | ICD-10-CM | POA: Diagnosis not present

## 2022-07-10 MED ORDER — TRIAMCINOLONE ACETONIDE 40 MG/ML IJ SUSP
40.0000 mg | Freq: Once | INTRAMUSCULAR | Status: AC
  Administered 2022-07-10: 40 mg via INTRAMUSCULAR

## 2022-07-10 MED ORDER — LOSARTAN POTASSIUM 50 MG PO TABS: 50.0000 mg | ORAL_TABLET | Freq: Every day | ORAL | 0 refills | Status: DC

## 2022-07-10 NOTE — Progress Notes (Unsigned)
Patient is here for arm pain that she says is 8/10. Patient said she works in  a [employer] and the job has taken a toll on here arms.   Patient given triamcinolone Acetonide 40 mg

## 2022-07-13 ENCOUNTER — Encounter: Payer: Self-pay | Admitting: Family Medicine

## 2022-07-13 NOTE — Progress Notes (Signed)
Established Patient Office Visit  Subjective    Patient ID: Marisa Yu, female    DOB: [birth year]  Age: 36 y.o. MRN: [id number]  CC:  Chief Complaint  Patient presents with   Arm Pain    HPI Marisa Yu presents with complaint of right arm pain. Patient does heavy lifting at work. She is also right hand dominant. She denies known trauma or injury. Symptoms have been for several weeks.    Outpatient Encounter Medications as of 07/10/2022  Medication Sig   losartan (COZAAR) 50 MG tablet Take 1 tablet (50 mg total) by mouth daily.   [DISCONTINUED] cephALEXin (KEFLEX) 500 MG capsule Take 1 capsule (500 mg total) by mouth 3 (three) times daily.   [DISCONTINUED] cyclobenzaprine (FLEXERIL) 5 MG tablet Take 1 tablet (5 mg total) by mouth 3 (three) times daily as needed for muscle spasms.   [DISCONTINUED] fluconazole (DIFLUCAN) 150 MG tablet Take 1 tablet (150 mg total) by mouth daily.   [DISCONTINUED] meloxicam (MOBIC) 7.5 MG tablet Take 1 tablet (7.5 mg total) by mouth daily.   [EXPIRED] triamcinolone acetonide (KENALOG-40) injection 40 mg    No facility-administered encounter medications on file as of 07/10/2022.    Past Medical History:  Diagnosis Date   Anemia    Anxiety    Boil, arm    Family history of BRCA1 gene positive 01/18/2018   Family history of breast cancer    GERD (gastroesophageal reflux disease)    Headache    Hypertension    Hypothyroidism    Tooth ache    Vaginal Pap smear, abnormal     Past Surgical History:  Procedure Laterality Date   CESAREAN SECTION  2004   CESAREAN SECTION  2009   CESAREAN SECTION WITH BILATERAL TUBAL LIGATION Bilateral 08/21/2015   Procedure: CESAREAN SECTION WITH BILATERAL TUBAL LIGATION;  Surgeon: Shelly Bombard, MD;  Location: [facility];  Service: Obstetrics;  Laterality: Bilateral;   CYST EXCISION     WISDOM TOOTH EXTRACTION      Family History  Problem Relation Age of Onset   Cancer Mother    Breast  cancer Maternal Aunt        dx 6's   Breast cancer Maternal Aunt        dx 40's   Breast cancer Maternal Grandmother    Heart disease Maternal Grandfather    Breast cancer Cousin 64       BRCA1 +   Breast cancer Cousin 33   Other Cousin        BRCA1 +   Breast cancer Cousin 36   Breast cancer Cousin 30   Asthma Son     Social History   Socioeconomic History   Marital status: Single    Spouse name: Not on file   Number of children: 2   Years of education: Not on file   Highest education level: Not on file  Occupational History   Occupation: Childcare    Employer: [id number]  Tobacco Use   Smoking status: Former    Types: Cigarettes    Quit date: 09/22/2011    Years since quitting: 10.8   Smokeless tobacco: Never  Vaping Use   Vaping Use: Never used  Substance and Sexual Activity   Alcohol use: Yes    Comment: occ   Drug use: No   Sexual activity: Not Currently    Partners: Male    Birth control/protection: None  Other Topics Concern   Not on file  Social History Narrative   Not on file   Social Determinants of Health   Financial Resource Strain: Not on file  Food Insecurity: Not on file  Transportation Needs: Not on file  Physical Activity: Not on file  Stress: Not on file  Social Connections: Not on file  Intimate Partner Violence: Not on file    Review of Systems  All other systems reviewed and are negative.       Objective    BP (!) 143/98   Pulse 71   Temp 98.1 F (36.7 C) (Oral)   Resp 16   Wt 238 lb 3.2 oz (108 kg)   LMP 06/18/2022   SpO2 95%   BMI 39.64 kg/m   Physical Exam Vitals and nursing note reviewed.  Constitutional:      General: She is not in acute distress. Cardiovascular:     Rate and Rhythm: Normal rate and regular rhythm.  Pulmonary:     Effort: Pulmonary effort is normal.     Breath sounds: Normal breath sounds.  Abdominal:     Palpations: Abdomen is soft.     Tenderness: There is no abdominal tenderness.   Musculoskeletal:     Right forearm: Tenderness present. No deformity.  Neurological:     General: No focal deficit present.     Mental Status: She is alert and oriented to person, place, and time.         Assessment & Plan:   1. Essential hypertension Elevated readings for past several visits. Will prescribe losartan 50 mg and monitor  2. Right arm pain Kenalog IM injection - triamcinolone acetonide (KENALOG-40) injection 40 mg  3. Macromastia Referral to plastics for further eval/mgt - Ambulatory referral to Plastic Surgery  4. Class 2 severe obesity due to excess calories with serious comorbidity and body mass index (BMI) of 39.0 to 39.9 in adult [facility]) Discussed dietary and activity options. Goal is 3-5lbs/mo wt loss.     Return in about 4 weeks (around 08/07/2022) for follow up.   Becky Sax, MD

## 2022-08-11 ENCOUNTER — Ambulatory Visit (INDEPENDENT_AMBULATORY_CARE_PROVIDER_SITE_OTHER): Payer: BC Managed Care – PPO | Admitting: Family Medicine

## 2022-08-11 ENCOUNTER — Encounter: Payer: Self-pay | Admitting: Family Medicine

## 2022-08-11 VITALS — BP 143/93 | HR 64 | Temp 98.1°F | Resp 16 | Ht 65.0 in | Wt 235.6 lb

## 2022-08-11 DIAGNOSIS — Z7689 Persons encountering health services in other specified circumstances: Secondary | ICD-10-CM

## 2022-08-11 DIAGNOSIS — I10 Essential (primary) hypertension: Secondary | ICD-10-CM

## 2022-08-11 DIAGNOSIS — Z6839 Body mass index (BMI) 39.0-39.9, adult: Secondary | ICD-10-CM | POA: Diagnosis not present

## 2022-08-11 DIAGNOSIS — L732 Hidradenitis suppurativa: Secondary | ICD-10-CM

## 2022-08-11 MED ORDER — PHENTERMINE HCL 37.5 MG PO CAPS: 37.5000 mg | ORAL_CAPSULE | ORAL | 0 refills | Status: DC

## 2022-08-11 MED ORDER — LOSARTAN POTASSIUM 100 MG PO TABS: 100.0000 mg | ORAL_TABLET | Freq: Every day | ORAL | 0 refills | Status: DC

## 2022-08-11 NOTE — Progress Notes (Signed)
Established Patient Office Visit  Subjective    Patient ID: Marisa Yu, female    DOB: 1985/09/30  Age: 36 y.o. MRN: [id number]  CC: No chief complaint on file.   HPI Marisa Yu presents for follow up of hypertension and for weight loss management. Patient also reports HS exacerbations with menses, particularly under her breast.    Outpatient Encounter Medications as of 08/11/2022  Medication Sig   losartan (COZAAR) 100 MG tablet Take 1 tablet (100 mg total) by mouth daily.   phentermine 37.5 MG capsule Take 1 capsule (37.5 mg total) by mouth every morning.   [DISCONTINUED] losartan (COZAAR) 50 MG tablet Take 1 tablet (50 mg total) by mouth daily.   No facility-administered encounter medications on file as of 08/11/2022.    Past Medical History:  Diagnosis Date   Anemia    Anxiety    Boil, arm    Family history of BRCA1 gene positive 01/18/2018   Family history of breast cancer    GERD (gastroesophageal reflux disease)    Headache    Hypertension    Hypothyroidism    Tooth ache    Vaginal Pap smear, abnormal     Past Surgical History:  Procedure Laterality Date   CESAREAN SECTION  2004   CESAREAN SECTION  2009   CESAREAN SECTION WITH BILATERAL TUBAL LIGATION Bilateral 08/21/2015   Procedure: CESAREAN SECTION WITH BILATERAL TUBAL LIGATION;  Surgeon: Shelly Bombard, MD;  Location: [place] [facility];  Service: Obstetrics;  Laterality: Bilateral;   CYST EXCISION     WISDOM TOOTH EXTRACTION      Family History  Problem Relation Age of Onset   Cancer Mother    Breast cancer Maternal Aunt        dx 6's   Breast cancer Maternal Aunt        dx 40's   Breast cancer Maternal Grandmother    Heart disease Maternal Grandfather    Breast cancer Cousin 57       BRCA1 +   Breast cancer Cousin 48   Other Cousin        BRCA1 +   Breast cancer Cousin 36   Breast cancer Cousin 30   Asthma Son     Social History   Socioeconomic History   Marital status:  Single    Spouse name: Not on file   Number of children: 2   Years of education: Not on file   Highest education level: Not on file  Occupational History   Occupation: Childcare    Employer: [id number]  Tobacco Use   Smoking status: Former    Types: Cigarettes    Quit date: 09/22/2011    Years since quitting: 10.8   Smokeless tobacco: Never  Vaping Use   Vaping Use: Never used  Substance and Sexual Activity   Alcohol use: Yes    Comment: occ   Drug use: No   Sexual activity: Not Currently    Partners: Male    Birth control/protection: None  Other Topics Concern   Not on file  Social History Narrative   Not on file   Social Determinants of Health   Financial Resource Strain: Not on file  Food Insecurity: Not on file  Transportation Needs: Not on file  Physical Activity: Not on file  Stress: Not on file  Social Connections: Not on file  Intimate Partner Violence: Not on file    Review of Systems  All other systems reviewed and are negative.  Objective    BP (!) 143/93   Pulse 64   Temp 98.1 F (36.7 C) (Oral)   Resp 16   Ht _0  (1.651 m)   Wt 235 lb 9.6 oz (106.9 kg)   SpO2 98%   BMI 39.21 kg/m   Physical Exam Vitals and nursing note reviewed.  Constitutional:      General: She is not in acute distress. Cardiovascular:     Rate and Rhythm: Normal rate and regular rhythm.  Pulmonary:     Effort: Pulmonary effort is normal.     Breath sounds: Normal breath sounds.  Abdominal:     Palpations: Abdomen is soft.     Tenderness: There is no abdominal tenderness.  Neurological:     General: No focal deficit present.     Mental Status: She is alert and oriented to person, place, and time.         Assessment & Plan:   1. Hidradenitis suppurativa Patient to follow up with consultant for further eval/mgt  2. Essential hypertension Elevated reading. Will increase losartan from 50 mg to 134m daily  3. Encounter for weight  management Phentermine prescribed. Discussed dietary and activity  options. Goal is 3-5lbs/mo wt loss.   4. Class 2 severe obesity due to excess calories with serious comorbidity and body mass index (BMI) of 39.0 to 39.9 in adult ([facility] As above    Return in about 4 weeks (around 09/08/2022).   WBecky Sax MD

## 2022-08-17 ENCOUNTER — Encounter: Payer: Self-pay | Admitting: Family Medicine

## 2022-08-18 ENCOUNTER — Encounter: Payer: Self-pay | Admitting: Family Medicine

## 2022-08-20 ENCOUNTER — Institutional Professional Consult (permissible substitution): Payer: BC Managed Care – PPO | Admitting: Plastic Surgery

## 2022-09-08 ENCOUNTER — Ambulatory Visit (INDEPENDENT_AMBULATORY_CARE_PROVIDER_SITE_OTHER): Payer: BC Managed Care – PPO | Admitting: Family Medicine

## 2022-09-08 VITALS — BP 130/86 | HR 99 | Temp 98.1°F | Resp 16 | Wt 232.4 lb

## 2022-09-08 DIAGNOSIS — L732 Hidradenitis suppurativa: Secondary | ICD-10-CM

## 2022-09-08 DIAGNOSIS — I10 Essential (primary) hypertension: Secondary | ICD-10-CM

## 2022-09-08 DIAGNOSIS — Z7689 Persons encountering health services in other specified circumstances: Secondary | ICD-10-CM

## 2022-09-08 DIAGNOSIS — Z6839 Body mass index (BMI) 39.0-39.9, adult: Secondary | ICD-10-CM

## 2022-09-08 MED ORDER — NORGESTIMATE-ETH ESTRADIOL 0.25-35 MG-MCG PO TABS: 1.0000 | ORAL_TABLET | Freq: Every day | ORAL | 0 refills | Status: DC

## 2022-09-08 MED ORDER — LOSARTAN POTASSIUM 100 MG PO TABS: 100.0000 mg | ORAL_TABLET | Freq: Every day | ORAL | 1 refills | Status: DC

## 2022-09-08 MED ORDER — PHENTERMINE HCL 37.5 MG PO CAPS: 37.5000 mg | ORAL_CAPSULE | ORAL | 0 refills | Status: DC

## 2022-09-09 ENCOUNTER — Encounter: Payer: Self-pay | Admitting: Family Medicine

## 2022-09-09 NOTE — Progress Notes (Signed)
Established Patient Office Visit  Subjective    Patient ID: Marisa Yu, female    DOB: 03/15/1986  Age: 36 y.o. MRN: [id number]  CC:  Chief Complaint  Patient presents with   Weight Check    HPI Marisa Yu presents for routine weight management. She also reports that she has been having HS breakouts premenstrually.she is wondering if there is an antibiotic regimen she should be following.    Outpatient Encounter Medications as of 09/08/2022  Medication Sig   norgestimate-ethinyl estradiol (ORTHO-CYCLEN) 0.25-35 MG-MCG tablet Take 1 tablet by mouth daily.   losartan (COZAAR) 100 MG tablet Take 1 tablet (100 mg total) by mouth daily.   phentermine 37.5 MG capsule Take 1 capsule (37.5 mg total) by mouth every morning.   [DISCONTINUED] losartan (COZAAR) 100 MG tablet Take 1 tablet (100 mg total) by mouth daily.   [DISCONTINUED] phentermine 37.5 MG capsule Take 1 capsule (37.5 mg total) by mouth every morning.   No facility-administered encounter medications on file as of 09/08/2022.    Past Medical History:  Diagnosis Date   Anemia    Anxiety    Boil, arm    Family history of BRCA1 gene positive 01/18/2018   Family history of breast cancer    GERD (gastroesophageal reflux disease)    Headache    Hypertension    Hypothyroidism    Tooth ache    Vaginal Pap smear, abnormal     Past Surgical History:  Procedure Laterality Date   CESAREAN SECTION  2004   CESAREAN SECTION  2009   CESAREAN SECTION WITH BILATERAL TUBAL LIGATION Bilateral 08/21/2015   Procedure: CESAREAN SECTION WITH BILATERAL TUBAL LIGATION;  Surgeon: Shelly Bombard, MD;  Location: [facility] [facility];  Service: Obstetrics;  Laterality: Bilateral;   CYST EXCISION     WISDOM TOOTH EXTRACTION      Family History  Problem Relation Age of Onset   Cancer Mother    Breast cancer Maternal Aunt        dx 30's   Breast cancer Maternal Aunt        dx 40's   Breast cancer Maternal Grandmother     Heart disease Maternal Grandfather    Breast cancer Cousin 68       BRCA1 +   Breast cancer Cousin 64   Other Cousin        BRCA1 +   Breast cancer Cousin 36   Breast cancer Cousin 67   Asthma Son     Social History   Socioeconomic History   Marital status: Single    Spouse name: Not on file   Number of children: 2   Years of education: Not on file   Highest education level: Not on file  Occupational History   Occupation: Childcare    Employer: [id number]  Tobacco Use   Smoking status: Former    Types: Cigarettes    Quit date: 09/22/2011    Years since quitting: 10.9   Smokeless tobacco: Never  Vaping Use   Vaping Use: Never used  Substance and Sexual Activity   Alcohol use: Yes    Comment: occ   Drug use: No   Sexual activity: Not Currently    Partners: Male    Birth control/protection: None  Other Topics Concern   Not on file  Social History Narrative   Not on file   Social Determinants of Health   Financial Resource Strain: Not on file  Food Insecurity: Not on file  Transportation Needs: Not on file  Physical Activity: Not on file  Stress: Not on file  Social Connections: Not on file  Intimate Partner Violence: Not on file    Review of Systems  All other systems reviewed and are negative.       Objective    BP 130/86   Pulse 99   Temp 98.1 F (36.7 C) (Oral)   Resp 16   Wt 232 lb 6.4 oz (105.4 kg)   SpO2 98%   BMI 38.67 kg/m   Physical Exam Vitals and nursing note reviewed.  Constitutional:      General: She is not in acute distress. Cardiovascular:     Rate and Rhythm: Normal rate and regular rhythm.  Pulmonary:     Effort: Pulmonary effort is normal.     Breath sounds: Normal breath sounds.  Abdominal:     Palpations: Abdomen is soft.     Tenderness: There is no abdominal tenderness.  Neurological:     General: No focal deficit present.     Mental Status: She is alert and oriented to person, place, and time.          Assessment & Plan:   1. Encounter for weight management Phentermine prescribed. Goals is 3-5lbs/mo  wt loss  2. Class 2 severe obesity due to excess calories with serious comorbidity and body mass index (BMI) of 39.0 to 39.9 in adult ([place])   3. Essential hypertension Appears stable. Continue and monitor. Meds refilled.   4. Hidradenitis suppurativa Even though patient has had a tubal, she would like to try OCP to see if it will improve the seemingly hormone related HS exacerbations. Ortho cyclen prescribed. monitor    Return in about 4 weeks (around 10/06/2022) for follow up.   Becky Sax, MD

## 2022-09-16 ENCOUNTER — Ambulatory Visit
Admission: EM | Admit: 2022-09-16 | Discharge: 2022-09-16 | Disposition: A | Discharge status: Home / Self Care | Payer: BC Managed Care – PPO | Attending: Physician Assistant | Admitting: Physician Assistant

## 2022-09-16 ENCOUNTER — Ambulatory Visit: Payer: Self-pay | Admitting: *Deleted

## 2022-09-16 DIAGNOSIS — L732 Hidradenitis suppurativa: Secondary | ICD-10-CM | POA: Diagnosis not present

## 2022-09-16 MED ORDER — DOXYCYCLINE HYCLATE 100 MG PO CAPS: 100.0000 mg | ORAL_CAPSULE | Freq: Two times a day (BID) | ORAL | 0 refills | Status: DC

## 2022-09-16 MED ORDER — FLUCONAZOLE 150 MG PO TABS: ORAL_TABLET | ORAL | 0 refills | Status: DC

## 2022-09-16 NOTE — ED Triage Notes (Signed)
Pt c/o hidradenitis suppurativa outbreak here for abx. Onset ~ 1 week ago.

## 2022-09-16 NOTE — ED Provider Notes (Signed)
[name]    CSN: [id number] Arrival date & time: 09/16/22  1259      History   Chief Complaint Chief Complaint  Patient presents with   hidradenitis suppurativa    HPI Marisa Yu is a 36 y.o. female.   Patient here today for evaluation of suspected flare of hidradenitis suppurativa. She reports for the last several days she has had small abscesses present to her bilateral groin area as well as two abscesses to her back. She has not had fever. She has tried warm compresses without resolution.      Past Medical History:  Diagnosis Date   Anemia    Anxiety    Boil, arm    Family history of BRCA1 gene positive 01/18/2018   Family history of breast cancer    GERD (gastroesophageal reflux disease)    Headache    Hypertension    Hypothyroidism    Tooth ache    Vaginal Pap smear, abnormal     Patient Active Problem List   Diagnosis Date Noted   Visit for routine gyn exam 03/19/2022   Family history of breast cancer 03/17/2020   Genetic testing 04/15/2018   Family history of BRCA1 gene positive 01/18/2018   Prediabetes 11/29/2017   S/P cesarean section 08/21/2015    Past Surgical History:  Procedure Laterality Date   CESAREAN SECTION  2004   CESAREAN SECTION  2009   CESAREAN SECTION WITH BILATERAL TUBAL LIGATION Bilateral 08/21/2015   Procedure: CESAREAN SECTION WITH BILATERAL TUBAL LIGATION;  Surgeon: Shelly Bombard, MD;  Location: [facility];  Service: Obstetrics;  Laterality: Bilateral;   CYST EXCISION     WISDOM TOOTH EXTRACTION      OB History     Gravida  3   Para  3   Term  3   Preterm  0   AB  0   Living  3      SAB  0   IAB  0   Ectopic  0   Multiple  0   Live Births  3            Home Medications    Prior to Admission medications   Medication Sig Start Date End Date Taking? Authorizing Provider  doxycycline (VIBRAMYCIN) 100 MG capsule Take 1 capsule (100 mg total) by mouth 2 (two) times daily.  09/16/22  Yes Francene Finders, PA-C  fluconazole (DIFLUCAN) 150 MG tablet Take one tab PO today and repeat dose in 3 days if symptoms persist 09/16/22  Yes Francene Finders, PA-C  losartan (COZAAR) 100 MG tablet Take 1 tablet (100 mg total) by mouth daily. 09/08/22   Dorna Mai, MD  norgestimate-ethinyl estradiol (ORTHO-CYCLEN) 0.25-35 MG-MCG tablet Take 1 tablet by mouth daily. 09/08/22   Dorna Mai, MD  phentermine 37.5 MG capsule Take 1 capsule (37.5 mg total) by mouth every morning. 09/08/22   Dorna Mai, MD    Family History Family History  Problem Relation Age of Onset   Cancer Mother    Breast cancer Maternal Aunt        dx 31's   Breast cancer Maternal Aunt        dx 40's   Breast cancer Maternal Grandmother    Heart disease Maternal Grandfather    Breast cancer Cousin 78       BRCA1 +   Breast cancer Cousin 68   Other Cousin        BRCA1 +  Breast cancer Cousin 36   Breast cancer Cousin 33   Asthma Son     Social History Social History   Tobacco Use   Smoking status: Former    Types: Cigarettes    Quit date: 09/22/2011    Years since quitting: 10.9   Smokeless tobacco: Never  Vaping Use   Vaping Use: Never used  Substance Use Topics   Alcohol use: Yes    Comment: occ   Drug use: No     Allergies   Codeine   Review of Systems Review of Systems  Constitutional:  Negative for chills and fever.  Eyes:  Negative for discharge and redness.  Gastrointestinal:  Negative for nausea and vomiting.  Skin:  Positive for color change. Negative for wound.     Physical Exam Triage Vital Signs ED Triage Vitals [09/16/22 1654]  Enc Vitals Group     BP (!) 145/95     Pulse Rate 83     Resp 16     Temp 98 F (36.7 C)     Temp Source Oral     SpO2 96 %     Weight      Height      Head Circumference      Peak Flow      Pain Score 9     Pain Loc      Pain Edu?      Excl. in [place]?    No data found.  Updated Vital Signs BP (!) 145/95 (BP  Location: Left Arm)   Pulse 83   Temp 98 F (36.7 C) (Oral)   Resp 16   SpO2 96%   Breastfeeding No      Physical Exam Vitals and nursing note reviewed.  Constitutional:      General: She is not in acute distress.    Appearance: Normal appearance. She is not ill-appearing.  HENT:     Head: Normocephalic and atraumatic.  Eyes:     Conjunctiva/sclera: Conjunctivae normal.  Cardiovascular:     Rate and Rhythm: Normal rate.  Pulmonary:     Effort: Pulmonary effort is normal. No respiratory distress.  Skin:    Comments: 4 separate areas of induration and hyperpigmentation noted to bilateral groin and mid back ranging from 1-2 cm in diameter, no active drainage noted  Neurological:     Mental Status: She is alert.  Psychiatric:        Mood and Affect: Mood normal.        Behavior: Behavior normal.        Thought Content: Thought content normal.      UC Treatments / Results  Labs (all labs ordered are listed, but only abnormal results are displayed) Labs Reviewed - No data to display  EKG   Radiology No results found.  Procedures Procedures (including critical care time)  Medications Ordered in UC Medications - No data to display  Initial Impression / Assessment and Plan / UC Course  I have reviewed the triage vital signs and the nursing notes.  Pertinent labs & imaging results that were available during my care of the patient were reviewed by me and considered in my medical decision making (see chart for details).    Will treat with antibiotic therapy given number of abscess and known HS. Patient requests diflucan as she typically will get yeast infections with antibiotic therapy. Encouraged warm compresses, ibuprofen if needed for pain and follow up with any further concerns.   Final Clinical  Impressions(s) / UC Diagnoses   Final diagnoses:  Hidradenitis suppurativa   Discharge Instructions   None    ED Prescriptions     Medication Sig Dispense Auth.  Provider   doxycycline (VIBRAMYCIN) 100 MG capsule Take 1 capsule (100 mg total) by mouth 2 (two) times daily. 20 capsule Ewell Poe F, PA-C   fluconazole (DIFLUCAN) 150 MG tablet Take one tab PO today and repeat dose in 3 days if symptoms persist 2 tablet Francene Finders, PA-C      PDMP not reviewed this encounter.   Francene Finders, PA-C 09/16/22 1845

## 2022-09-16 NOTE — Telephone Encounter (Signed)
Patient is at [facility] . I advise patient to stay there to be seen due to PCP is out the office and Amy,NP schedule is full

## 2022-09-16 NOTE — Telephone Encounter (Signed)
  Chief Complaint: skin boils to groin areas severe pain , requesting antibiotics Symptoms: skin swelling and severe pain reported to groin area one on crease size of quarter and "lip" area in groin nickel size boil. No drainage. Has been applying heat with no relief.  Frequency: 1 week or since 09/08/22 since last seen in office  Pertinent Negatives: Patient denies fever. No drainage from boils at this time  Disposition: [] ED /[x] Urgent Care (no appt availability in office) / [] Appointment(In office/virtual)/ []  [facility]/ [] Home Care/ [] Refused Recommended Disposition /[] [facility]/ []  Follow-up with PCP Additional Notes:   Recommended UC and patient reports she was just seen for the boils. Requesting antibiotics due to severe pain . Patient does not want to go to UC. Please advise.   Reason for Disposition  SEVERE pain (e.g., excruciating)  Answer Assessment - Initial Assessment Questions 1. APPEARANCE of BOIL: "What does the boil look like?"      Swelling red areas to bilateral groin areas  2. LOCATION: "Where is the boil located?"      One in crease of groin and "lip" area of groin 3. NUMBER: "How many boils are there?"      2 4. SIZE: "How big is the boil?" (e.g., inches, cm; compare to size of a coin or other object)     Crease size of quarter and "lip" size of nickel 5. ONSET: "When did the boil start?"     1 week ago or since 09/08/22 6. PAIN: "Is there any pain?" If Yes, ask: "How bad is the pain?"   (Scale 1-10; or mild, moderate, severe)     Severe pain  7. FEVER: "Do you have a fever?" If Yes, ask: "What is it, how was it measured, and when did it start?"      Denies  8. SOURCE: "Have you been around anyone with boils or other Staph infections?" "Have you ever had boils before?"     Had boil on skin during last visit not this severe 9. OTHER SYMPTOMS: "Do you have any other symptoms?" (e.g., shaking chills, weakness, rash elsewhere on body)      Pain and swelling no drainage  10. PREGNANCY: "Is there any chance you are pregnant?" "When was your last menstrual period?"       na  Protocols used: Boil (Skin Abscess)-A-AH

## 2022-09-23 ENCOUNTER — Encounter: Payer: Self-pay | Admitting: Plastic Surgery

## 2022-09-23 ENCOUNTER — Ambulatory Visit (INDEPENDENT_AMBULATORY_CARE_PROVIDER_SITE_OTHER): Payer: BC Managed Care – PPO | Admitting: Plastic Surgery

## 2022-09-23 VITALS — BP 133/89 | HR 80 | Ht 65.0 in | Wt 236.0 lb

## 2022-09-23 DIAGNOSIS — N62 Hypertrophy of breast: Secondary | ICD-10-CM

## 2022-09-23 DIAGNOSIS — M542 Cervicalgia: Secondary | ICD-10-CM

## 2022-09-23 DIAGNOSIS — M546 Pain in thoracic spine: Secondary | ICD-10-CM | POA: Diagnosis not present

## 2022-09-23 DIAGNOSIS — M25519 Pain in unspecified shoulder: Secondary | ICD-10-CM

## 2022-09-23 DIAGNOSIS — Z803 Family history of malignant neoplasm of breast: Secondary | ICD-10-CM | POA: Diagnosis not present

## 2022-09-23 DIAGNOSIS — Z6839 Body mass index (BMI) 39.0-39.9, adult: Secondary | ICD-10-CM

## 2022-09-23 NOTE — Progress Notes (Signed)
Referring Provider Dorna Mai, MD [address] [place],  [place] [id number]   CC:  Chief Complaint  Patient presents with   Advice Only      Marisa Yu is an 37 y.o. female.  HPI: Marisa Yu is a 37 year old female with complaints of worsening upper back and neck pain that she feels are related to the large size of her breast.  She states that her breasts have been continuing to grow in size over the past few years and the pain is getting worse.  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  She is requesting surgical reduction in the size of the breast. She notes extensive family history of breast cancer on her mother side.  She has already started screening mammography.  She denies any change in her breasts, breast exam or any past history of personal breast disease.  Allergies  Allergen Reactions   Codeine Itching    Outpatient Encounter Medications as of 09/23/2022  Medication Sig   losartan (COZAAR) 100 MG tablet Take 1 tablet (100 mg total) by mouth daily.   norgestimate-ethinyl estradiol (ORTHO-CYCLEN) 0.25-35 MG-MCG tablet Take 1 tablet by mouth daily.   phentermine 37.5 MG capsule Take 1 capsule (37.5 mg total) by mouth every morning.   doxycycline (VIBRAMYCIN) 100 MG capsule Take 1 capsule (100 mg total) by mouth 2 (two) times daily. (Patient not taking: Reported on 09/23/2022)   fluconazole (DIFLUCAN) 150 MG tablet Take one tab PO today and repeat dose in 3 days if symptoms persist (Patient not taking: Reported on 09/23/2022)   No facility-administered encounter medications on file as of 09/23/2022.     Past Medical History:  Diagnosis Date   Anemia    Anxiety    Boil, arm    Family history of BRCA1 gene positive 01/18/2018   Family history of breast cancer    GERD (gastroesophageal reflux disease)    Headache    Hypertension    Hypothyroidism    Tooth ache    Vaginal Pap smear, abnormal      Past Surgical History:  Procedure Laterality Date   CESAREAN SECTION  2004   CESAREAN SECTION  2009   CESAREAN SECTION WITH BILATERAL TUBAL LIGATION Bilateral 08/21/2015   Procedure: CESAREAN SECTION WITH BILATERAL TUBAL LIGATION;  Surgeon: Shelly Bombard, MD;  Location: [facility];  Service: Obstetrics;  Laterality: Bilateral;   CYST EXCISION     WISDOM TOOTH EXTRACTION      Family History  Problem Relation Age of Onset   Cancer Mother    Breast cancer Maternal Aunt        dx 67's   Breast cancer Maternal Aunt        dx 40's   Breast cancer Maternal Grandmother    Heart disease Maternal Grandfather    Breast cancer Cousin 46       BRCA1 +   Breast cancer Cousin 15   Other Cousin        BRCA1 +   Breast cancer Cousin 39   Breast cancer Cousin 75   Asthma Son     Social History   Social History Narrative   Not on file     Review of Systems General: Denies fevers, chills, weight loss CV: Denies chest pain, shortness of breath, palpitations Breasts: History of breast masses, nipple discharge.  Breasts are large pendulous and resulted in upper back and neck pain.  Physical Exam  09/23/2022   10:33 AM 09/16/2022    4:54 PM 09/08/2022    4:26 PM  Vitals with BMI  Height _0     Weight 236 lbs  232 lbs 6 oz  BMI 42.39  53.20  Systolic 233 435 686  Diastolic 89 95 86  Pulse 80 83 99    General:  No acute distress,  Alert and oriented, Non-Toxic, Normal speech and affect Breast: Patient has very large very pendulous breast with grade 3 ptosis.  Her sternal notch to nipple distance on the right is 40 cm and 40 cm on the left her fold to nipple distance on the right is 20 cm and 20 cm on the left.  She does not have any easily palpable masses and the nipples are normal in appearance. Mammogram: Mammogram performed in July 2023 was BI-RADS 1 Assessment/Plan Symptomatic macromastia: Patient has very large very pendulous breasts.  She would likely benefit from a  bilateral breast reduction.  I believe that I can remove 800 - 900 grams of breast tissue from each breast.  Discussed breast reduction at length.  Specifically I showed her the location of the incisions and the unpredictable nature of scarring.  We discussed the risks of bleeding, infection, and seroma formation.  We discussed the risks of nipple loss due to nipple ischemia which I feel are much higher in her due to the size of her breasts and to her overall size.  She understands that she may require a free nipple graft during the procedure.  If she requires a free nipple graft the nipple will be insensate and loss and hypopigmentation of the areola is common.  We discussed the fact that breast reduction may result in a slightly more difficult to interpret mammogram.  Postoperatively the activity restrictions include no heavy lifting greater than 20 pounds, no vigorous activity, and no submerging the incisions in water.  She may return to light activity as tolerated.  If she has drains postoperatively she will likely have those removed the morning following surgery.  This will be determined at the time of the procedure.  All questions were answered to her satisfaction.  She request that we proceed.  [name] 09/23/2022, 11:24 AM

## 2022-10-05 ENCOUNTER — Telehealth: Payer: Self-pay | Admitting: Student

## 2022-10-05 NOTE — Telephone Encounter (Signed)
LVM and my chart message to let pt know appt on 11/23/22 has been changed from 1:20pm to 11:20 pm.  Advised pt to call office if this time frame does not work to r/s to another time.

## 2022-10-06 ENCOUNTER — Ambulatory Visit: Payer: BC Managed Care – PPO | Admitting: Family Medicine

## 2022-10-06 ENCOUNTER — Encounter: Payer: Self-pay | Admitting: Family Medicine

## 2022-10-06 VITALS — BP 128/87 | HR 80 | Temp 98.1°F | Resp 16 | Wt 229.0 lb

## 2022-10-06 DIAGNOSIS — Z6838 Body mass index (BMI) 38.0-38.9, adult: Secondary | ICD-10-CM

## 2022-10-06 DIAGNOSIS — I10 Essential (primary) hypertension: Secondary | ICD-10-CM | POA: Diagnosis not present

## 2022-10-06 DIAGNOSIS — L732 Hidradenitis suppurativa: Secondary | ICD-10-CM

## 2022-10-06 DIAGNOSIS — Z7689 Persons encountering health services in other specified circumstances: Secondary | ICD-10-CM

## 2022-10-06 MED ORDER — PHENTERMINE HCL 37.5 MG PO CAPS: 37.5000 mg | ORAL_CAPSULE | ORAL | 0 refills | Status: DC

## 2022-10-08 ENCOUNTER — Encounter: Payer: Self-pay | Admitting: Family Medicine

## 2022-10-08 NOTE — Progress Notes (Signed)
Established Patient Office Visit  Subjective    Patient ID: Marisa Yu, female    DOB: 1986/04/13  Age: 37 y.o. MRN: [id number]  CC:  Chief Complaint  Patient presents with   Weight Check   Hypertension    HPI Marisa Yu presents for routine weight management. She also reports that she has stopped ocp although she felt that it has helped her HS symptoms.    Outpatient Encounter Medications as of 10/06/2022  Medication Sig   losartan (COZAAR) 100 MG tablet Take 1 tablet (100 mg total) by mouth daily.   [DISCONTINUED] norgestimate-ethinyl estradiol (ORTHO-CYCLEN) 0.25-35 MG-MCG tablet Take 1 tablet by mouth daily.   [DISCONTINUED] phentermine 37.5 MG capsule Take 1 capsule (37.5 mg total) by mouth every morning.   phentermine 37.5 MG capsule Take 1 capsule (37.5 mg total) by mouth every morning.   [DISCONTINUED] doxycycline (VIBRAMYCIN) 100 MG capsule Take 1 capsule (100 mg total) by mouth 2 (two) times daily. (Patient not taking: Reported on 09/23/2022)   [DISCONTINUED] fluconazole (DIFLUCAN) 150 MG tablet Take one tab PO today and repeat dose in 3 days if symptoms persist (Patient not taking: Reported on 09/23/2022)   No facility-administered encounter medications on file as of 10/06/2022.    Past Medical History:  Diagnosis Date   Anemia    Anxiety    Boil, arm    Family history of BRCA1 gene positive 01/18/2018   Family history of breast cancer    GERD (gastroesophageal reflux disease)    Headache    Hypertension    Hypothyroidism    Tooth ache    Vaginal Pap smear, abnormal     Past Surgical History:  Procedure Laterality Date   CESAREAN SECTION  2004   CESAREAN SECTION  2009   CESAREAN SECTION WITH BILATERAL TUBAL LIGATION Bilateral 08/21/2015   Procedure: CESAREAN SECTION WITH BILATERAL TUBAL LIGATION;  Surgeon: Shelly Bombard, MD;  Location: [facility];  Service: Obstetrics;  Laterality: Bilateral;   CYST EXCISION     WISDOM TOOTH EXTRACTION       Family History  Problem Relation Age of Onset   Cancer Mother    Breast cancer Maternal Aunt        dx 36's   Breast cancer Maternal Aunt        dx 40's   Breast cancer Maternal Grandmother    Heart disease Maternal Grandfather    Breast cancer Cousin 80       BRCA1 +   Breast cancer Cousin 88   Other Cousin        BRCA1 +   Breast cancer Cousin 36   Breast cancer Cousin 38   Asthma Son     Social History   Socioeconomic History   Marital status: Single    Spouse name: Not on file   Number of children: 2   Years of education: Not on file   Highest education level: Not on file  Occupational History   Occupation: Childcare    Employer: [id number]  Tobacco Use   Smoking status: Former    Types: Cigarettes    Quit date: 09/22/2011    Years since quitting: 11.0   Smokeless tobacco: Never  Vaping Use   Vaping Use: Never used  Substance and Sexual Activity   Alcohol use: Yes    Comment: occ   Drug use: No   Sexual activity: Not Currently    Partners: Male    Birth control/protection: None  Other  Topics Concern   Not on file  Social History Narrative   Not on file   Social Determinants of Health   Financial Resource Strain: Not on file  Food Insecurity: Not on file  Transportation Needs: Not on file  Physical Activity: Not on file  Stress: Not on file  Social Connections: Not on file  Intimate Partner Violence: Not on file    Review of Systems  All other systems reviewed and are negative.       Objective    BP 128/87   Pulse 80   Temp 98.1 F (36.7 C) (Oral)   Resp 16   Wt 229 lb (103.9 kg)   SpO2 99%   BMI 38.11 kg/m   Physical Exam Vitals and nursing note reviewed.  Constitutional:      General: She is not in acute distress. Cardiovascular:     Rate and Rhythm: Normal rate and regular rhythm.  Pulmonary:     Effort: Pulmonary effort is normal.     Breath sounds: Normal breath sounds.  Abdominal:     Palpations: Abdomen is soft.      Tenderness: There is no abdominal tenderness.  Neurological:     General: No focal deficit present.     Mental Status: She is alert and oriented to person, place, and time.         Assessment & Plan:   1. Encounter for weight management Doing well with present management. Phentermine refilled.   2. Class 2 severe obesity due to excess calories with serious comorbidity and body mass index (BMI) of 38.0 to 38.9 in adult [facility]) As above  3. Essential hypertension Appears stable. Continue and monitor  4. Hidradenitis suppurativa Ocps dc'd. Will refer to derm for further eval/mgt - Ambulatory referral to Dermatology    Return in about 4 weeks (around 11/03/2022).   Becky Sax, MD

## 2022-10-27 ENCOUNTER — Other Ambulatory Visit: Payer: Self-pay

## 2022-10-27 ENCOUNTER — Encounter: Payer: Self-pay | Admitting: Physical Therapy

## 2022-10-27 ENCOUNTER — Ambulatory Visit: Payer: BC Managed Care – PPO | Attending: Family Medicine | Admitting: Physical Therapy

## 2022-10-27 DIAGNOSIS — M6281 Muscle weakness (generalized): Secondary | ICD-10-CM | POA: Diagnosis not present

## 2022-10-27 DIAGNOSIS — M25519 Pain in unspecified shoulder: Secondary | ICD-10-CM | POA: Insufficient documentation

## 2022-10-27 DIAGNOSIS — M542 Cervicalgia: Secondary | ICD-10-CM | POA: Insufficient documentation

## 2022-10-27 DIAGNOSIS — M546 Pain in thoracic spine: Secondary | ICD-10-CM | POA: Diagnosis not present

## 2022-10-27 DIAGNOSIS — M5459 Other low back pain: Secondary | ICD-10-CM

## 2022-10-27 DIAGNOSIS — N62 Hypertrophy of breast: Secondary | ICD-10-CM | POA: Diagnosis not present

## 2022-10-27 NOTE — Therapy (Signed)
OUTPATIENT PHYSICAL THERAPY SHOULDER EVALUATION   Patient Name: Marisa Yu MRN: [id number] DOB:[birth year], 37 y.o., female Today's Date: 10/28/2022   PT End of Session - 10/28/22 0902     Visit Number 1    Number of Visits --   1-2x/week   Date for PT Re-Evaluation 12/23/22    Authorization Type BCBS    PT Start Time 1830    PT Stop Time 1855    PT Time Calculation (min) 25 min             Past Medical History:  Diagnosis Date   Anemia    Anxiety    Boil, arm    Family history of BRCA1 gene positive 01/18/2018   Family history of breast cancer    GERD (gastroesophageal reflux disease)    Headache    Hypertension    Hypothyroidism    Tooth ache    Vaginal Pap smear, abnormal    Past Surgical History:  Procedure Laterality Date   CESAREAN SECTION  2004   CESAREAN SECTION  2009   CESAREAN SECTION WITH BILATERAL TUBAL LIGATION Bilateral 08/21/2015   Procedure: CESAREAN SECTION WITH BILATERAL TUBAL LIGATION;  Surgeon: Shelly Bombard, MD;  Location: [facility];  Service: Obstetrics;  Laterality: Bilateral;   CYST EXCISION     WISDOM TOOTH EXTRACTION     Patient Active Problem List   Diagnosis Date Noted   Visit for routine gyn exam 03/19/2022   Family history of breast cancer 03/17/2020   Genetic testing 04/15/2018   Family history of BRCA1 gene positive 01/18/2018   Prediabetes 11/29/2017   S/P cesarean section 08/21/2015    PCP: Dorna Mai, MD  REFERRING PROVIDER: Dorna Mai, MD  THERAPY DIAG:  Pain in thoracic spine - Plan: PT plan of care cert/re-cert  Other low back pain - Plan: PT plan of care cert/re-cert  Muscle weakness - Plan: PT plan of care cert/re-cert  REFERRING DIAG: Hypertrophy of breast [N62], Neck and shoulder pain [M54.2, M25.519]   Rationale for Evaluation and Treatment Rehabilitation  Patient Name: Marisa Yu MRN: [id number] DOB:02-23-1986, 37 y.o., female Today's Date: 10/28/2022   PT End of Session -  10/28/22 0902     Visit Number 1    Number of Visits --   1-2x/week   Date for PT Re-Evaluation 12/23/22    Authorization Type BCBS    PT Start Time 1830    PT Stop Time 1855    PT Time Calculation (min) 25 min             Past Medical History:  Diagnosis Date   Anemia    Anxiety    Boil, arm    Family history of BRCA1 gene positive 01/18/2018   Family history of breast cancer    GERD (gastroesophageal reflux disease)    Headache    Hypertension    Hypothyroidism    Tooth ache    Vaginal Pap smear, abnormal    Past Surgical History:  Procedure Laterality Date   CESAREAN SECTION  2004   CESAREAN SECTION  2009   CESAREAN SECTION WITH BILATERAL TUBAL LIGATION Bilateral 08/21/2015   Procedure: CESAREAN SECTION WITH BILATERAL TUBAL LIGATION;  Surgeon: Shelly Bombard, MD;  Location: [facility];  Service: Obstetrics;  Laterality: Bilateral;   CYST EXCISION     WISDOM TOOTH EXTRACTION     Patient Active Problem List   Diagnosis Date Noted   Visit for routine gyn exam 03/19/2022  Family history of breast cancer 03/17/2020   Genetic testing 04/15/2018   Family history of BRCA1 gene positive 01/18/2018   Prediabetes 11/29/2017   S/P cesarean section 08/21/2015    PCP: Dorna Mai, MD  REFERRING PROVIDER: Dorna Mai, MD  THERAPY DIAG:  Pain in thoracic spine - Plan: PT plan of care cert/re-cert  Other low back pain - Plan: PT plan of care cert/re-cert  Muscle weakness - Plan: PT plan of care cert/re-cert  REFERRING DIAG: Hypertrophy of breast [N62], Neck and shoulder pain [M54.2, M25.519]   Rationale for Evaluation and Treatment:  Rehabilitation  SUBJECTIVE:  PERTINENT PAST HISTORY:  none        PRECAUTIONS: None  WEIGHT BEARING RESTRICTIONS No  FALLS:  Has patient fallen in last 6 months? No, Number of falls: 0  MOI/History of condition:  Onset date: 5-10 year  SUBJECTIVE STATEMENT  Marisa Yu is a 37 y.o. female who presents to  clinic with chief complaint of neck, thoracic, and lumbar pain.  This started slowly about 5-10 years ago and has slowly gotten worse.  She feels it is related to mammary hypertrophy.   Red flags:  denies   Pain:  Are you having pain? Yes Pain location: neck, mid back, and low back pain NPRS scale:  0/10 to 10/10 Aggravating factors: work (lifting and moving objects - up to 75 lbs) Relieving factors: take NSAID Pain description: aching Stage: Chronic Stability: getting worse 24 hour pattern: worse with activity   Occupation: works in [employer], occupational: NA  Hand Dominance: R  Patient Goals/Specific Activities: Reduce pain, get breast reduction   OBJECTIVE:   GENERAL OBSERVATION:  Mammary hypertrophy, forward head, rounded shoulders     SENSATION:  Light touch: Appears intact   PALPATION: TTP thoracic and lumbar paraspinals  LUMBAR AROM  AROM AROM  10/28/2022  Flexion Fingertips to toes (WNL), w/ concordant pain  Extension WNL, w/ concordant pain  Right lateral flexion WNL, w/ concordant pain  Left lateral flexion WNL, w/ concordant pain  Right rotation WNL, w/ concordant pain  Left rotation WNL, w/ concordant pain    (Blank rows = not tested)    Throacic AROM  AROM AROM  10/28/2022  Flexion   Extension WNL, w/ concordant pain  Right lateral flexion   Left lateral flexion   Right rotation WNL, w/ concordant pain  Left rotation WNL, w/ concordant pain    (Blank rows = not tested)  Cervical AROM: WNL  UPPER EXTREMITY MMT:  MMT Right 10/28/2022 Left 10/28/2022  Shoulder flexion    Shoulder abduction (C5)    Shoulder ER    Shoulder IR    Middle trapezius 3+ 3+  Lower trapezius 3 3  Shoulder extension    Grip strength    Cervical flexion (C1,C2)    Cervical S/B (C3)    Shoulder shrug (C4)    Elbow flexion (C6)    Elbow ext (C7)    Thumb ext (C8)    Finger abd (T1)    Grossly     (Blank rows = not tested, score listed is out of 5  possible points.  N = WNL, D = diminished, C = clear for gross weakness with myotome testing, * = concordant pain with testing)  ASLR=PSLF bil  Single leg bridge - unable full ROM with pelvic drop bil  JOINT MOBILITY TESTING:  Hypomobile throughout thoracic spine   TODAY'S TREATMENT:  Creating, reviewing, and completing below HEP  PATIENT EDUCATION:  POC, diagnosis, prognosis, HEP.  Pt educated via explanation, demonstration, and handout (HEP).  Pt confirms understanding verbally.    HOME EXERCISE PROGRAM:  Access Code: [email or web address] URL: [email or web address] Date: 10/27/2022 Prepared by: Shearon Balo  Exercises - Sidelying Thoracic Rotation with Open Book  - 1 x daily - 7 x weekly - 1 sets - 20 reps - 2 hold - Seated Shoulder Horizontal Abduction with Resistance - Palms Down  - 1 x daily - 7 x weekly - 3 sets - 10 reps - Supine Bridge  - 1 x daily - 7 x weekly - 3 sets - 10 reps   ASSESSMENT:  CLINICAL IMPRESSION: Marisa Yu is a 37 y.o. female who presents to clinic with signs and sxs consistent with mechanical thoracic and lumbar pain.  She presents with forward head and rounded shoulder posture.  She has significant weakness of the periscapular's, hips, and core.    OBJECTIVE IMPAIRMENTS: Pain, periscapular weakness, hip and core weakness  ACTIVITY LIMITATIONS: lifting, work  PERSONAL FACTORS: See medical history and pertinent history   REHAB POTENTIAL: Fair chronic  CLINICAL DECISION MAKING: Stable/uncomplicated  EVALUATION COMPLEXITY: Low   GOALS:  SHORT TERM GOALS: Marisa Yu will be >75% HEP compliant throughout therapy to improve carryover between sessions and facilitate independent management of condition.  LONG TERM GOALS:   Marisa Yu will self report >/= 50% decrease in pain from evaluation   Evaluation/Baseline (10/28/2022): 10/10 max pain Target date: 12/23/2022 Goal status: INITIAL   2.  Marisa Yu will improve the following MMTs to >/= 4/5 to  show improvement in strength:  mid and lower traps; ability to perform SL bridge   Evaluation/Baseline (10/28/2022): see chart in note Target date: 12/23/2022 Goal status: INITIAL  3.  Marisa Yu will report confidence in self management of condition at time of discharge with advanced HEP  Evaluation/Baseline (10/28/2022): unable to self manage Target date: 12/23/2022 Goal status: INITIAL    PLAN: PT FREQUENCY: 1-2x/week  PT DURATION: 8 weeks (Ending 12/23/2022)  PLANNED INTERVENTIONS: Therapeutic exercises, Aquatic therapy, Therapeutic activity, Neuro Muscular re-education, Gait training, Patient/Family education, Joint mobilization, Dry Needling, Electrical stimulation, Spinal mobilization and/or manipulation, Moist heat, Taping, Vasopneumatic device, Ionotophoresis 4mg /ml Dexamethasone, and Manual therapy  PLAN FOR NEXT SESSION: Progressive throacic mobility along with postural muscle strengthening   Shearon Balo PT, DPT 10/28/2022, 9:13 AM

## 2022-11-03 ENCOUNTER — Ambulatory Visit (INDEPENDENT_AMBULATORY_CARE_PROVIDER_SITE_OTHER): Payer: BC Managed Care – PPO | Admitting: Family Medicine

## 2022-11-03 ENCOUNTER — Encounter: Payer: Self-pay | Admitting: Family Medicine

## 2022-11-03 ENCOUNTER — Ambulatory Visit: Payer: BC Managed Care – PPO | Admitting: Family Medicine

## 2022-11-03 VITALS — BP 143/95 | HR 91 | Temp 98.1°F | Resp 16 | Wt 228.0 lb

## 2022-11-03 DIAGNOSIS — Z6837 Body mass index (BMI) 37.0-37.9, adult: Secondary | ICD-10-CM

## 2022-11-03 DIAGNOSIS — I10 Essential (primary) hypertension: Secondary | ICD-10-CM | POA: Diagnosis not present

## 2022-11-03 DIAGNOSIS — Z7689 Persons encountering health services in other specified circumstances: Secondary | ICD-10-CM | POA: Diagnosis not present

## 2022-11-03 MED ORDER — PHENTERMINE HCL 37.5 MG PO CAPS: 37.5000 mg | ORAL_CAPSULE | ORAL | 0 refills | Status: DC

## 2022-11-03 NOTE — Progress Notes (Unsigned)
Patient came in for monthly weight check. Patient has no other concerns today

## 2022-11-03 NOTE — Progress Notes (Unsigned)
Established Patient Office Visit  Subjective    Patient ID: Marisa Yu, female    DOB: [birth year]  Age: 37 y.o. MRN: [id number]  CC:  Chief Complaint  Patient presents with   Weight Check    HPI Marisa Yu presents to establish care   Outpatient Encounter Medications as of 11/03/2022  Medication Sig   losartan (COZAAR) 100 MG tablet Take 1 tablet (100 mg total) by mouth daily.   phentermine 37.5 MG capsule Take 1 capsule (37.5 mg total) by mouth every morning.   No facility-administered encounter medications on file as of 11/03/2022.    Past Medical History:  Diagnosis Date   Anemia    Anxiety    Boil, arm    Family history of BRCA1 gene positive 01/18/2018   Family history of breast cancer    GERD (gastroesophageal reflux disease)    Headache    Hypertension    Hypothyroidism    Tooth ache    Vaginal Pap smear, abnormal     Past Surgical History:  Procedure Laterality Date   CESAREAN SECTION  2004   CESAREAN SECTION  2009   CESAREAN SECTION WITH BILATERAL TUBAL LIGATION Bilateral 08/21/2015   Procedure: CESAREAN SECTION WITH BILATERAL TUBAL LIGATION;  Surgeon: Shelly Bombard, MD;  Location: [facility];  Service: Obstetrics;  Laterality: Bilateral;   CYST EXCISION     WISDOM TOOTH EXTRACTION      Family History  Problem Relation Age of Onset   Cancer Mother    Breast cancer Maternal Aunt        dx 59's   Breast cancer Maternal Aunt        dx 40's   Breast cancer Maternal Grandmother    Heart disease Maternal Grandfather    Breast cancer Cousin 58       BRCA1 +   Breast cancer Cousin 21   Other Cousin        BRCA1 +   Breast cancer Cousin 36   Breast cancer Cousin 92   Asthma Son     Social History   Socioeconomic History   Marital status: Single    Spouse name: Not on file   Number of children: 2   Years of education: Not on file   Highest education level: Not on file  Occupational History   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  Tobacco Use   Smoking status: Former    Types: Cigarettes    Quit date: 09/22/2011    Years since quitting: 11.1   Smokeless tobacco: Never  Vaping Use   Vaping Use: Never used  Substance and Sexual Activity   Alcohol use: Yes    Comment: occ   Drug use: No   Sexual activity: Not Currently    Partners: Male    Birth control/protection: None  Other Topics Concern   Not on file  Social History Narrative   Not on file   Social Determinants of Health   Financial Resource Strain: Not on file  Food Insecurity: Not on file  Transportation Needs: Not on file  Physical Activity: Not on file  Stress: Not on file  Social Connections: Not on file  Intimate Partner Violence: Not on file    ROS      Objective    BP (!) 143/95   Pulse 91   Temp 98.1 F (36.7 C) (Oral)   Resp 16   Wt 228 lb (103.4 kg)   SpO2 95%  BMI 37.94 kg/m   Physical Exam  {Labs (Optional):23779}    Assessment & Plan:   Problem List Items Addressed This Visit   None Visit Diagnoses     Encounter for weight management    -  Primary   Class 2 severe obesity due to excess calories with serious comorbidity and body mass index (BMI) of 37.0 to 37.9 in adult [facility])           No follow-ups on file.   Becky Sax, MD

## 2022-11-04 ENCOUNTER — Encounter: Payer: Self-pay | Admitting: Physical Therapy

## 2022-11-04 ENCOUNTER — Ambulatory Visit: Payer: BC Managed Care – PPO | Admitting: Physical Therapy

## 2022-11-04 ENCOUNTER — Encounter: Payer: Self-pay | Admitting: Family Medicine

## 2022-11-04 DIAGNOSIS — M542 Cervicalgia: Secondary | ICD-10-CM | POA: Diagnosis not present

## 2022-11-04 DIAGNOSIS — M6281 Muscle weakness (generalized): Secondary | ICD-10-CM | POA: Diagnosis not present

## 2022-11-04 DIAGNOSIS — M546 Pain in thoracic spine: Secondary | ICD-10-CM | POA: Diagnosis not present

## 2022-11-04 DIAGNOSIS — M5459 Other low back pain: Secondary | ICD-10-CM | POA: Diagnosis not present

## 2022-11-04 DIAGNOSIS — N62 Hypertrophy of breast: Secondary | ICD-10-CM | POA: Diagnosis not present

## 2022-11-04 DIAGNOSIS — M25519 Pain in unspecified shoulder: Secondary | ICD-10-CM | POA: Diagnosis not present

## 2022-11-04 NOTE — Therapy (Signed)
OUTPATIENT PHYSICAL THERAPY TREATMENT NOTE   Patient Name: Marisa Yu MRN: [id number] DOB:02-26-1986, 37 y.o., female Today's Date: 11/04/2022  PCP: Dorna Mai, MD   REFERRING PROVIDER: Dorna Mai, MD   PT End of Session - 11/04/22 1730     Visit Number 2    Number of Visits --   1-2x/week   Date for PT Re-Evaluation 12/23/22    Authorization Type BCBS    PT Start Time 1730    PT Stop Time [id number]    PT Time Calculation (min) 41 min             Past Medical History:  Diagnosis Date   Anemia    Anxiety    Boil, arm    Family history of BRCA1 gene positive 01/18/2018   Family history of breast cancer    GERD (gastroesophageal reflux disease)    Headache    Hypertension    Hypothyroidism    Tooth ache    Vaginal Pap smear, abnormal    Past Surgical History:  Procedure Laterality Date   CESAREAN SECTION  2004   CESAREAN SECTION  2009   CESAREAN SECTION WITH BILATERAL TUBAL LIGATION Bilateral 08/21/2015   Procedure: CESAREAN SECTION WITH BILATERAL TUBAL LIGATION;  Surgeon: Shelly Bombard, MD;  Location: [facility];  Service: Obstetrics;  Laterality: Bilateral;   CYST EXCISION     WISDOM TOOTH EXTRACTION     Patient Active Problem List   Diagnosis Date Noted   Visit for routine gyn exam 03/19/2022   Family history of breast cancer 03/17/2020   Genetic testing 04/15/2018   Family history of BRCA1 gene positive 01/18/2018   Prediabetes 11/29/2017   S/P cesarean section 08/21/2015    THERAPY DIAG:  Pain in thoracic spine  Other low back pain  Muscle weakness   Rationale for Evaluation and Treatment Rehabilitation  REFERRING DIAG: Hypertrophy of breast [N62], Neck and shoulder pain [M54.2, M25.519]    PERTINENT HISTORY: none  PRECAUTIONS/RESTRICTIONS:   none  SUBJECTIVE:  Pt reports that her pain remains high particularly in the low and mid back.  She rates her pain at 8/10.  Pain:  Are you having pain? Yes Pain location: neck,  mid back, and low back pain NPRS scale:  0/10 to 10/10 Aggravating factors: work (lifting and moving objects - up to 75 lbs) Relieving factors: take NSAID Pain description: aching Stage: Chronic Stability: getting worse 24 hour pattern: worse with activity   OBJECTIVE: (objective measures completed at initial evaluation unless otherwise dated)  GENERAL OBSERVATION:          Mammary hypertrophy, forward head, rounded shoulders                               SENSATION:          Light touch: Appears intact           PALPATION: TTP thoracic and lumbar paraspinals   LUMBAR AROM   AROM AROM  10/28/2022  Flexion Fingertips to toes (WNL), w/ concordant pain  Extension WNL, w/ concordant pain  Right lateral flexion WNL, w/ concordant pain  Left lateral flexion WNL, w/ concordant pain  Right rotation WNL, w/ concordant pain  Left rotation WNL, w/ concordant pain    (Blank rows = not tested)      Throacic AROM   AROM AROM  10/28/2022  Flexion    Extension WNL, w/ concordant pain  Right  lateral flexion    Left lateral flexion    Right rotation WNL, w/ concordant pain  Left rotation WNL, w/ concordant pain    (Blank rows = not tested)   Cervical AROM: WNL   UPPER EXTREMITY MMT:   MMT Right 10/28/2022 Left 10/28/2022  Shoulder flexion      Shoulder abduction (C5)      Shoulder ER      Shoulder IR      Middle trapezius 3+ 3+  Lower trapezius 3 3  Shoulder extension      Grip strength      Cervical flexion (C1,C2)      Cervical S/B (C3)      Shoulder shrug (C4)      Elbow flexion (C6)      Elbow ext (C7)      Thumb ext (C8)      Finger abd (T1)      Grossly        (Blank rows = not tested, score listed is out of 5 possible points.  N = WNL, D = diminished, C = clear for gross weakness with myotome testing, * = concordant pain with testing)   ASLR=PSLF bil   Single leg bridge - unable full ROM with pelvic drop bil   JOINT MOBILITY TESTING:  Hypomobile throughout  thoracic spine     TODAY'S TREATMENT:  Creating, reviewing, and completing below HEP     PATIENT EDUCATION:  POC, diagnosis, prognosis, HEP.  Pt educated via explanation, demonstration, and handout (HEP).  Pt confirms understanding verbally.      HOME EXERCISE PROGRAM:   Access Code: [email or web address] URL: [email or web address] Date: 10/27/2022 Prepared by: Marisa Yu   Exercises - Sidelying Thoracic Rotation with Open Book  - 1 x daily - 7 x weekly - 1 sets - 20 reps - 2 hold - Seated Shoulder Horizontal Abduction with Resistance - Palms Down  - 1 x daily - 7 x weekly - 3 sets - 10 reps - Supine Bridge  - 1 x daily - 7 x weekly - 3 sets - 10 reps  TREATMENT 11/04/2022:  Therapeutic Exercise: - UBE 2.5'/2.5' fwd and backward for warm up while taking subjective - LTR - 20x - Alternating clam with GTB - 3x10 ea - pilates ring squeeze - 5'' hold - 2x10 - abdominal isometric squeeze with pball - 5'' - 2x10 - bridge - 3x10 - Horizontal abd with GTB - 3x10 - diagonals with GTB - 2x10 - low row - 3x10 - 20# - high row - 3x10 - 20# - lat pull down - 3x10 - 20#   Manual Therapy: - UPA and CPA mid thoracic spine, G III, Pt in prone    ASSESSMENT:   CLINICAL IMPRESSION: Marisa Yu tolerated session well with no adverse reaction.  Concentrated on core/hip/periscapular strengthening to good effect.  Pt with high level of fatigue but no increase in pain following therapy.  Pt reports pain reduction following MT.   OBJECTIVE IMPAIRMENTS: Pain, periscapular weakness, hip and core weakness   ACTIVITY LIMITATIONS: lifting, work   PERSONAL FACTORS: See medical history and pertinent history     REHAB POTENTIAL: Fair chronic   CLINICAL DECISION MAKING: Stable/uncomplicated   EVALUATION COMPLEXITY: Low     GOALS:   SHORT TERM GOALS: Marisa Yu will be >75% HEP compliant throughout therapy to improve carryover between sessions and facilitate independent management of  condition.   LONG TERM GOALS:    Marisa Yu will self report >/= 50% decrease  in pain from evaluation    Evaluation/Baseline (10/28/2022): 10/10 max pain Target date: 12/23/2022 Goal status: INITIAL    2.  Marisa Yu will improve the following MMTs to >/= 4/5 to show improvement in strength:  mid and lower traps; ability to perform SL bridge    Evaluation/Baseline (10/28/2022): see chart in note Target date: 12/23/2022 Goal status: INITIAL   3.  Sherene will report confidence in self management of condition at time of discharge with advanced HEP   Evaluation/Baseline (10/28/2022): unable to self manage Target date: 12/23/2022 Goal status: INITIAL       PLAN: PT FREQUENCY: 1-2x/week   PT DURATION: 8 weeks (Ending 12/23/2022)   PLANNED INTERVENTIONS: Therapeutic exercises, Aquatic therapy, Therapeutic activity, Neuro Muscular re-education, Gait training, Patient/Family education, Joint mobilization, Dry Needling, Electrical stimulation, Spinal mobilization and/or manipulation, Moist heat, Taping, Vasopneumatic device, Ionotophoresis 75m/ml Dexamethasone, and Manual therapy   PLAN FOR NEXT SESSION: Progressive throacic mobility along with postural muscle strengthening   KKevan NyReinhartsen PT 11/04/2022, 6:11 PM

## 2022-11-12 ENCOUNTER — Ambulatory Visit: Payer: BC Managed Care – PPO

## 2022-11-12 DIAGNOSIS — M546 Pain in thoracic spine: Secondary | ICD-10-CM

## 2022-11-12 DIAGNOSIS — M5459 Other low back pain: Secondary | ICD-10-CM | POA: Diagnosis not present

## 2022-11-12 DIAGNOSIS — M6281 Muscle weakness (generalized): Secondary | ICD-10-CM | POA: Diagnosis not present

## 2022-11-12 DIAGNOSIS — N62 Hypertrophy of breast: Secondary | ICD-10-CM | POA: Diagnosis not present

## 2022-11-12 DIAGNOSIS — M25519 Pain in unspecified shoulder: Secondary | ICD-10-CM | POA: Diagnosis not present

## 2022-11-12 DIAGNOSIS — M542 Cervicalgia: Secondary | ICD-10-CM | POA: Diagnosis not present

## 2022-11-12 NOTE — Therapy (Signed)
OUTPATIENT PHYSICAL THERAPY TREATMENT NOTE   Patient Name: Marisa Yu MRN: [id number] DOB:[birth year], 37 y.o., female Today's Date: 11/12/2022  PCP: Dorna Mai, MD   REFERRING PROVIDER: Dorna Mai, MD   PT End of Session - 11/12/22 1828     Visit Number 3    Date for PT Re-Evaluation 12/23/22    Authorization Type BCBS    PT Start Time [id number]    PT Stop Time 1908    PT Time Calculation (min) 40 min    Activity Tolerance Patient tolerated treatment well    Behavior During Therapy [facility] for tasks assessed/performed              Past Medical History:  Diagnosis Date   Anemia    Anxiety    Boil, arm    Family history of BRCA1 gene positive 01/18/2018   Family history of breast cancer    GERD (gastroesophageal reflux disease)    Headache    Hypertension    Hypothyroidism    Tooth ache    Vaginal Pap smear, abnormal    Past Surgical History:  Procedure Laterality Date   CESAREAN SECTION  2004   CESAREAN SECTION  2009   CESAREAN SECTION WITH BILATERAL TUBAL LIGATION Bilateral 08/21/2015   Procedure: CESAREAN SECTION WITH BILATERAL TUBAL LIGATION;  Surgeon: Shelly Bombard, MD;  Location: [facility];  Service: Obstetrics;  Laterality: Bilateral;   CYST EXCISION     WISDOM TOOTH EXTRACTION     Patient Active Problem List   Diagnosis Date Noted   Visit for routine gyn exam 03/19/2022   Family history of breast cancer 03/17/2020   Genetic testing 04/15/2018   Family history of BRCA1 gene positive 01/18/2018   Prediabetes 11/29/2017   S/P cesarean section 08/21/2015    THERAPY DIAG:  Pain in thoracic spine  Other low back pain  Muscle weakness   Rationale for Evaluation and Treatment Rehabilitation  REFERRING DIAG: Hypertrophy of breast [N62], Neck and shoulder pain [M54.2, M25.519]    PERTINENT HISTORY: none  PRECAUTIONS/RESTRICTIONS:   none  SUBJECTIVE:  Pt reports that her pain remains high particularly in the low and mid back.  She  rates her pain at 9/10.  Pain:  Are you having pain? Yes Pain location: neck, mid back, and low back pain NPRS scale:  0/10 to 10/10 Aggravating factors: work (lifting and moving objects - up to 75 lbs) Relieving factors: take NSAID Pain description: aching Stage: Chronic Stability: getting worse 24 hour pattern: worse with activity   OBJECTIVE: (objective measures completed at initial evaluation unless otherwise dated)  GENERAL OBSERVATION:          Mammary hypertrophy, forward head, rounded shoulders                               SENSATION:          Light touch: Appears intact           PALPATION: TTP thoracic and lumbar paraspinals   LUMBAR AROM   AROM AROM  10/28/2022  Flexion Fingertips to toes (WNL), w/ concordant pain  Extension WNL, w/ concordant pain  Right lateral flexion WNL, w/ concordant pain  Left lateral flexion WNL, w/ concordant pain  Right rotation WNL, w/ concordant pain  Left rotation WNL, w/ concordant pain    (Blank rows = not tested)      Throacic AROM   AROM AROM  10/28/2022  Flexion    Extension WNL, w/ concordant pain  Right lateral flexion    Left lateral flexion    Right rotation WNL, w/ concordant pain  Left rotation WNL, w/ concordant pain    (Blank rows = not tested)   Cervical AROM: WNL   UPPER EXTREMITY MMT:   MMT Right 10/28/2022 Left 10/28/2022  Shoulder flexion      Shoulder abduction (C5)      Shoulder ER      Shoulder IR      Middle trapezius 3+ 3+  Lower trapezius 3 3  Shoulder extension      Grip strength      Cervical flexion (C1,C2)      Cervical S/B (C3)      Shoulder shrug (C4)      Elbow flexion (C6)      Elbow ext (C7)      Thumb ext (C8)      Finger abd (T1)      Grossly        (Blank rows = not tested, score listed is out of 5 possible points.  N = WNL, D = diminished, C = clear for gross weakness with myotome testing, * = concordant pain with testing)   ASLR=PSLF bil   Single leg bridge - unable  full ROM with pelvic drop bil   JOINT MOBILITY TESTING:  Hypomobile throughout thoracic spine     TODAY'S TREATMENT:  Creating, reviewing, and completing below HEP     PATIENT EDUCATION:  POC, diagnosis, prognosis, HEP.  Pt educated via explanation, demonstration, and handout (HEP).  Pt confirms understanding verbally.      HOME EXERCISE PROGRAM:   Access Code: [email or web address] URL: [email or web address] Date: 10/27/2022 Prepared by: Shearon Balo   Exercises - Sidelying Thoracic Rotation with Open Book  - 1 x daily - 7 x weekly - 1 sets - 20 reps - 2 hold - Seated Shoulder Horizontal Abduction with Resistance - Palms Down  - 1 x daily - 7 x weekly - 3 sets - 10 reps - Supine Bridge  - 1 x daily - 7 x weekly - 3 sets - 10 reps  TREATMENT 11/12/22: Therapeutic Exercise: - Nustep level 5 x 5 mins - Alternating clam with GTB - 3x10 ea - pilates ring squeeze - 5'' hold - 2x10 - Supine horizontal abd with GTB - 3x10 - diagonals with GTB - 2x10 - low row - 3x10 - 20# - high row - 3x10 - 20# - lat pull down - 3x10 - 20#  TREATMENT 11/04/2022:  Therapeutic Exercise: - UBE 2.5'/2.5' fwd and backward for warm up while taking subjective - LTR - 20x - Alternating clam with GTB - 3x10 ea - pilates ring squeeze - 5'' hold - 2x10 - abdominal isometric squeeze with pball - 5'' - 2x10 - bridge - 3x10 - Horizontal abd with GTB - 3x10 - diagonals with GTB - 2x10 - low row - 3x10 - 20# - high row - 3x10 - 20# - lat pull down - 3x10 - 20#   Manual Therapy: - UPA and CPA mid thoracic spine, G III, Pt in prone    ASSESSMENT:   CLINICAL IMPRESSION: Patient presents to PT with continued pain in her lower back, mid back, and BIL shoulders. Session today continued to focus on periscapular and proximal hip strengthening. Patient was able to tolerate all prescribed exercises with no adverse effects. Patient continues to benefit from skilled PT services and should be progressed  as able to improve functional independence.     OBJECTIVE IMPAIRMENTS: Pain, periscapular weakness, hip and core weakness   ACTIVITY LIMITATIONS: lifting, work   PERSONAL FACTORS: See medical history and pertinent history     REHAB POTENTIAL: Fair chronic   CLINICAL DECISION MAKING: Stable/uncomplicated   EVALUATION COMPLEXITY: Low     GOALS:   SHORT TERM GOALS: Ambyr will be >75% HEP compliant throughout therapy to improve carryover between sessions and facilitate independent management of condition.   LONG TERM GOALS:    Tristaca will self report >/= 50% decrease in pain from evaluation    Evaluation/Baseline (10/28/2022): 10/10 max pain Target date: 12/23/2022 Goal status: INITIAL    2.  Raeesah will improve the following MMTs to >/= 4/5 to show improvement in strength:  mid and lower traps; ability to perform SL bridge    Evaluation/Baseline (10/28/2022): see chart in note Target date: 12/23/2022 Goal status: INITIAL   3.  Vimla will report confidence in self management of condition at time of discharge with advanced HEP   Evaluation/Baseline (10/28/2022): unable to self manage Target date: 12/23/2022 Goal status: INITIAL       PLAN: PT FREQUENCY: 1-2x/week   PT DURATION: 8 weeks (Ending 12/23/2022)   PLANNED INTERVENTIONS: Therapeutic exercises, Aquatic therapy, Therapeutic activity, Neuro Muscular re-education, Gait training, Patient/Family education, Joint mobilization, Dry Needling, Electrical stimulation, Spinal mobilization and/or manipulation, Moist heat, Taping, Vasopneumatic device, Ionotophoresis 51m/ml Dexamethasone, and Manual therapy   PLAN FOR NEXT SESSION: Progressive throacic mobility along with postural muscle strengthening   SMargarette CanadaPTA 11/12/2022, 7:06 PM

## 2022-11-15 ENCOUNTER — Other Ambulatory Visit: Payer: Self-pay | Admitting: Family Medicine

## 2022-11-18 ENCOUNTER — Ambulatory Visit: Payer: BC Managed Care – PPO

## 2022-11-18 DIAGNOSIS — M25519 Pain in unspecified shoulder: Secondary | ICD-10-CM | POA: Diagnosis not present

## 2022-11-18 DIAGNOSIS — M542 Cervicalgia: Secondary | ICD-10-CM | POA: Diagnosis not present

## 2022-11-18 DIAGNOSIS — M546 Pain in thoracic spine: Secondary | ICD-10-CM

## 2022-11-18 DIAGNOSIS — M6281 Muscle weakness (generalized): Secondary | ICD-10-CM | POA: Diagnosis not present

## 2022-11-18 DIAGNOSIS — M5459 Other low back pain: Secondary | ICD-10-CM | POA: Diagnosis not present

## 2022-11-18 DIAGNOSIS — N62 Hypertrophy of breast: Secondary | ICD-10-CM | POA: Diagnosis not present

## 2022-11-18 NOTE — Therapy (Signed)
OUTPATIENT PHYSICAL THERAPY TREATMENT NOTE   Patient Name: Marisa Yu MRN: [id number] DOB:25-Jul-1986, 37 y.o., female Today's Date: 11/18/2022  PCP: Dorna Mai, MD   REFERRING PROVIDER: Dorna Mai, MD   PT End of Session - 11/18/22 1825     Visit Number 4    Date for PT Re-Evaluation 12/23/22    Authorization Type BCBS    PT Start Time 1825    PT Stop Time 1905    PT Time Calculation (min) 40 min    Activity Tolerance Patient tolerated treatment well    Behavior During Therapy [facility] for tasks assessed/performed               Past Medical History:  Diagnosis Date   Anemia    Anxiety    Boil, arm    Family history of BRCA1 gene positive 01/18/2018   Family history of breast cancer    GERD (gastroesophageal reflux disease)    Headache    Hypertension    Hypothyroidism    Tooth ache    Vaginal Pap smear, abnormal    Past Surgical History:  Procedure Laterality Date   CESAREAN SECTION  2004   CESAREAN SECTION  2009   CESAREAN SECTION WITH BILATERAL TUBAL LIGATION Bilateral 08/21/2015   Procedure: CESAREAN SECTION WITH BILATERAL TUBAL LIGATION;  Surgeon: Shelly Bombard, MD;  Location: [facility];  Service: Obstetrics;  Laterality: Bilateral;   CYST EXCISION     WISDOM TOOTH EXTRACTION     Patient Active Problem List   Diagnosis Date Noted   Visit for routine gyn exam 03/19/2022   Family history of breast cancer 03/17/2020   Genetic testing 04/15/2018   Family history of BRCA1 gene positive 01/18/2018   Prediabetes 11/29/2017   S/P cesarean section 08/21/2015    THERAPY DIAG:  Pain in thoracic spine  Other low back pain   Rationale for Evaluation and Treatment Rehabilitation  REFERRING DIAG: Hypertrophy of breast [N62], Neck and shoulder pain [M54.2, M25.519]    PERTINENT HISTORY: none  PRECAUTIONS/RESTRICTIONS:   none  SUBJECTIVE:  Patient reports continued pain, though lessened today. She states she has joined a gym and so far  has been going to walk on the treadmill.   Pain:  Are you having pain? Yes Pain location: neck, mid back, and low back pain NPRS scale:  5/10 to 10/10 Aggravating factors: work (lifting and moving objects - up to 75 lbs) Relieving factors: take NSAID Pain description: aching Stage: Chronic Stability: getting worse 24 hour pattern: worse with activity   OBJECTIVE: (objective measures completed at initial evaluation unless otherwise dated)  GENERAL OBSERVATION:          Mammary hypertrophy, forward head, rounded shoulders                               SENSATION:          Light touch: Appears intact           PALPATION: TTP thoracic and lumbar paraspinals   LUMBAR AROM   AROM AROM  10/28/2022  Flexion Fingertips to toes (WNL), w/ concordant pain  Extension WNL, w/ concordant pain  Right lateral flexion WNL, w/ concordant pain  Left lateral flexion WNL, w/ concordant pain  Right rotation WNL, w/ concordant pain  Left rotation WNL, w/ concordant pain    (Blank rows = not tested)      Throacic AROM   AROM AROM  10/28/2022  Flexion    Extension WNL, w/ concordant pain  Right lateral flexion    Left lateral flexion    Right rotation WNL, w/ concordant pain  Left rotation WNL, w/ concordant pain    (Blank rows = not tested)   Cervical AROM: WNL   UPPER EXTREMITY MMT:   MMT Right 10/28/2022 Left 10/28/2022  Shoulder flexion      Shoulder abduction (C5)      Shoulder ER      Shoulder IR      Middle trapezius 3+ 3+  Lower trapezius 3 3  Shoulder extension      Grip strength      Cervical flexion (C1,C2)      Cervical S/B (C3)      Shoulder shrug (C4)      Elbow flexion (C6)      Elbow ext (C7)      Thumb ext (C8)      Finger abd (T1)      Grossly        (Blank rows = not tested, score listed is out of 5 possible points.  N = WNL, D = diminished, C = clear for gross weakness with myotome testing, * = concordant pain with testing)   ASLR=PSLF bil   Single leg  bridge - unable full ROM with pelvic drop bil   JOINT MOBILITY TESTING:  Hypomobile throughout thoracic spine     TODAY'S TREATMENT:  Creating, reviewing, and completing below HEP     PATIENT EDUCATION:  POC, diagnosis, prognosis, HEP.  Pt educated via explanation, demonstration, and handout (HEP).  Pt confirms understanding verbally.      HOME EXERCISE PROGRAM:   Access Code: [email or web address] URL: [email or web address] Date: 10/27/2022 Prepared by: Shearon Balo   Exercises - Sidelying Thoracic Rotation with Open Book  - 1 x daily - 7 x weekly - 1 sets - 20 reps - 2 hold - Seated Shoulder Horizontal Abduction with Resistance - Palms Down  - 1 x daily - 7 x weekly - 3 sets - 10 reps - Supine Bridge  - 1 x daily - 7 x weekly - 3 sets - 10 reps  TREATMENT 11/18/22: Therapeutic Exercise: - Nustep level 5 x 5 mins - sidelying clam with GTB - 2x10 ea - pilates ring squeeze - 5'' hold - 2x10 - bridge with ball 2x10 - supine 90/90 isometric hold 2x20" - Supine horizontal abd with GTB - 2x10 - diagonals with GTB - x10 BIL - low row - 3x10 - 20# - high row - 3x10 - 20# - lat pull down - 3x10 - 20#  TREATMENT 11/12/22: Therapeutic Exercise: - Nustep level 5 x 5 mins - Alternating clam with GTB - 3x10 ea - pilates ring squeeze - 5'' hold - 2x10 - Supine horizontal abd with GTB - 3x10 - diagonals with GTB - 2x10 - low row - 3x10 - 20# - high row - 3x10 - 20# - lat pull down - 3x10 - 20#  TREATMENT 11/04/2022:  Therapeutic Exercise: - UBE 2.5'/2.5' fwd and backward for warm up while taking subjective - LTR - 20x - Alternating clam with GTB - 3x10 ea - pilates ring squeeze - 5'' hold - 2x10 - abdominal isometric squeeze with pball - 5'' - 2x10 - bridge - 3x10 - Horizontal abd with GTB - 3x10 - diagonals with GTB - 2x10 - low row - 3x10 - 20# - high row - 3x10 - 20# - lat pull down -  3x10 - 20#   Manual Therapy: - UPA and CPA mid thoracic spine, G III, Pt in  prone    ASSESSMENT:   CLINICAL IMPRESSION: Patient presents to PT with continued, though lessened, pain in her lower and middle back today. Session today continued to focus on periscapular, core, and proximal hip strengthening. Most difficult exercise was supine 90/90 isometric hold due to core weakness, and patient needed cues to breathe while contracting abdominals. Patient was able to tolerate all prescribed exercises with no adverse effects. Patient continues to benefit from skilled PT services and should be progressed as able to improve functional independence.     OBJECTIVE IMPAIRMENTS: Pain, periscapular weakness, hip and core weakness   ACTIVITY LIMITATIONS: lifting, work   PERSONAL FACTORS: See medical history and pertinent history     REHAB POTENTIAL: Fair chronic   CLINICAL DECISION MAKING: Stable/uncomplicated   EVALUATION COMPLEXITY: Low     GOALS:   SHORT TERM GOALS: Alaire will be >75% HEP compliant throughout therapy to improve carryover between sessions and facilitate independent management of condition.   LONG TERM GOALS:    Posey will self report >/= 50% decrease in pain from evaluation    Evaluation/Baseline (10/28/2022): 10/10 max pain Target date: 12/23/2022 Goal status: INITIAL    2.  Kadedra will improve the following MMTs to >/= 4/5 to show improvement in strength:  mid and lower traps; ability to perform SL bridge    Evaluation/Baseline (10/28/2022): see chart in note Target date: 12/23/2022 Goal status: INITIAL   3.  Lashaunte will report confidence in self management of condition at time of discharge with advanced HEP   Evaluation/Baseline (10/28/2022): unable to self manage Target date: 12/23/2022 Goal status: INITIAL       PLAN: PT FREQUENCY: 1-2x/week   PT DURATION: 8 weeks (Ending 12/23/2022)   PLANNED INTERVENTIONS: Therapeutic exercises, Aquatic therapy, Therapeutic activity, Neuro Muscular re-education, Gait training, Patient/Family  education, Joint mobilization, Dry Needling, Electrical stimulation, Spinal mobilization and/or manipulation, Moist heat, Taping, Vasopneumatic device, Ionotophoresis '4mg'$ /ml Dexamethasone, and Manual therapy   PLAN FOR NEXT SESSION: Progressive throacic mobility along with postural muscle strengthening   Margarette Canada PTA 11/18/2022, 7:05 PM

## 2022-11-18 NOTE — Progress Notes (Deleted)
   Referring Provider Dorna Mai, MD [address] [place],  [place] [address]   CC: No chief complaint on file.     Marisa Yu is an 37 y.o. female.  HPI: Patient is a 37 y.o. year old female here for follow up after completing physical therapy for pain related to macromastia.   She was seen for initial consult by Dr. Lovena Le on 09/23/2022.  At that time, patient reported worsening upper back and neck pain that she felt were related to the large size of her breasts.  Patient stated that her breasts have been continuing to grow in size over the past few years and the pain was getting worse.  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  Patient was requesting surgical reduction in the size of her breasts.  Patient also reported she has extensive family history of breast cancer on her mother side.  Patient reported she had already started screening mammography.    On exam, patient was noted to have very large and very pendulous breasts with grade 3 ptosis.  Her STN on the right was 40 cm and her STN on the left was 40 cm.  It was also noted that patient had a mammogram performed in July 2023 which was BI-RADS Category 1: Negative.  Patient was found to have very large and very pendulous breasts which she would likely benefit from a bilateral breast reduction.  The estimated amount of tissue to be removed at the time of surgery was 809 100 g from each breast.  Physical therapy was ordered for the patient.  Today,    Review of Systems General: *** MSK: Endorses ongoing back and neck discomfort Skin: *** rashes  Physical Exam    11/03/2022    4:16 PM 10/06/2022    4:32 PM 09/23/2022   10:33 AM  Vitals with BMI  Height   5' 5"$   Weight 228 lbs 229 lbs 236 lbs  BMI  [id number]  Systolic [id number]  Diastolic 95 87 89  Pulse 91 80 80    General:  No acute distress,  Alert and oriented,  Non-Toxic, Normal speech and affect Psych: Normal behavior and mood Respiratory: No increased WOB MSK: Ambulatory  Assessment/Plan  Patient is interested in pursuing surgical intervention for bilateral breast reduction. Patient has completed at least 6 weeks of physical therapy for pain related to macromastia.  Discussed with patient we would submit to insurance for authorization, discussed approval could take up to 6 weeks.   Clance Boll 11/18/2022, 10:17 AM

## 2022-11-23 ENCOUNTER — Ambulatory Visit: Payer: BC Managed Care – PPO | Admitting: Student

## 2022-11-23 DIAGNOSIS — N62 Hypertrophy of breast: Secondary | ICD-10-CM

## 2022-11-25 ENCOUNTER — Ambulatory Visit: Payer: BC Managed Care – PPO | Attending: Family Medicine

## 2022-11-25 DIAGNOSIS — M6281 Muscle weakness (generalized): Secondary | ICD-10-CM | POA: Diagnosis not present

## 2022-11-25 DIAGNOSIS — M5459 Other low back pain: Secondary | ICD-10-CM | POA: Diagnosis not present

## 2022-11-25 DIAGNOSIS — M546 Pain in thoracic spine: Secondary | ICD-10-CM | POA: Diagnosis not present

## 2022-11-25 NOTE — Therapy (Signed)
OUTPATIENT PHYSICAL THERAPY TREATMENT NOTE   Patient Name: Marisa Yu MRN: [id number] DOB:1986/08/20, 37 y.o., female Today's Date: 11/25/2022  PCP: Dorna Mai, MD   REFERRING PROVIDER: Dorna Mai, MD   PT End of Session - 11/25/22 1826     Visit Number 5    Date for PT Re-Evaluation 12/23/22    Authorization Type BCBS    PT Start Time 1826    PT Stop Time 1906    PT Time Calculation (min) 40 min    Activity Tolerance Patient tolerated treatment well    Behavior During Therapy [facility] for tasks assessed/performed                Past Medical History:  Diagnosis Date   Anemia    Anxiety    Boil, arm    Family history of BRCA1 gene positive 01/18/2018   Family history of breast cancer    GERD (gastroesophageal reflux disease)    Headache    Hypertension    Hypothyroidism    Tooth ache    Vaginal Pap smear, abnormal    Past Surgical History:  Procedure Laterality Date   CESAREAN SECTION  2004   CESAREAN SECTION  2009   CESAREAN SECTION WITH BILATERAL TUBAL LIGATION Bilateral 08/21/2015   Procedure: CESAREAN SECTION WITH BILATERAL TUBAL LIGATION;  Surgeon: Shelly Bombard, MD;  Location: [facility];  Service: Obstetrics;  Laterality: Bilateral;   CYST EXCISION     WISDOM TOOTH EXTRACTION     Patient Active Problem List   Diagnosis Date Noted   Visit for routine gyn exam 03/19/2022   Family history of breast cancer 03/17/2020   Genetic testing 04/15/2018   Family history of BRCA1 gene positive 01/18/2018   Prediabetes 11/29/2017   S/P cesarean section 08/21/2015    THERAPY DIAG:  Pain in thoracic spine  Other low back pain  Muscle weakness   Rationale for Evaluation and Treatment Rehabilitation  REFERRING DIAG: Hypertrophy of breast [N62], Neck and shoulder pain [M54.2, M25.519]    PERTINENT HISTORY: none  PRECAUTIONS/RESTRICTIONS:   none  SUBJECTIVE:  Patient reports to PT with increased pain, HEP non-compliance recently due to  increased business.   Pain:  Are you having pain? Yes Pain location: neck, mid back, and low back pain NPRS scale:  8/10 to 10/10 Aggravating factors: work (lifting and moving objects - up to 75 lbs) Relieving factors: take NSAID Pain description: aching Stage: Chronic Stability: getting worse 24 hour pattern: worse with activity   OBJECTIVE: (objective measures completed at initial evaluation unless otherwise dated)  GENERAL OBSERVATION:          Mammary hypertrophy, forward head, rounded shoulders                               SENSATION:          Light touch: Appears intact           PALPATION: TTP thoracic and lumbar paraspinals   LUMBAR AROM   AROM AROM  10/28/2022  Flexion Fingertips to toes (WNL), w/ concordant pain  Extension WNL, w/ concordant pain  Right lateral flexion WNL, w/ concordant pain  Left lateral flexion WNL, w/ concordant pain  Right rotation WNL, w/ concordant pain  Left rotation WNL, w/ concordant pain    (Blank rows = not tested)      Throacic AROM   AROM AROM  10/28/2022  Flexion  Extension WNL, w/ concordant pain  Right lateral flexion    Left lateral flexion    Right rotation WNL, w/ concordant pain  Left rotation WNL, w/ concordant pain    (Blank rows = not tested)   Cervical AROM: WNL   UPPER EXTREMITY MMT:   MMT Right 10/28/2022 Left 10/28/2022  Shoulder flexion      Shoulder abduction (C5)      Shoulder ER      Shoulder IR      Middle trapezius 3+ 3+  Lower trapezius 3 3  Shoulder extension      Grip strength      Cervical flexion (C1,C2)      Cervical S/B (C3)      Shoulder shrug (C4)      Elbow flexion (C6)      Elbow ext (C7)      Thumb ext (C8)      Finger abd (T1)      Grossly        (Blank rows = not tested, score listed is out of 5 possible points.  N = WNL, D = diminished, C = clear for gross weakness with myotome testing, * = concordant pain with testing)   ASLR=PSLF bil   Single leg bridge - unable full  ROM with pelvic drop bil   JOINT MOBILITY TESTING:  Hypomobile throughout thoracic spine     TODAY'S TREATMENT:  Creating, reviewing, and completing below HEP     PATIENT EDUCATION:  POC, diagnosis, prognosis, HEP.  Pt educated via explanation, demonstration, and handout (HEP).  Pt confirms understanding verbally.      HOME EXERCISE PROGRAM:   Access Code: [email or web address] URL: [email or web address] Date: 10/27/2022 Prepared by: Shearon Balo   Exercises - Sidelying Thoracic Rotation with Open Book  - 1 x daily - 7 x weekly - 1 sets - 20 reps - 2 hold - Seated Shoulder Horizontal Abduction with Resistance - Palms Down  - 1 x daily - 7 x weekly - 3 sets - 10 reps - Supine Bridge  - 1 x daily - 7 x weekly - 3 sets - 10 reps  TREATMENT 11/25/22: Therapeutic Exercise: - Nustep level 5 x 5 mins - pilates ring squeeze - 5'' hold - 2x10 - bridge with ball 2x10 - Supine horizontal abd with GTB - 2x10 - diagonals with GTB - x10 BIL - low row - 3x10 - 25# - high row - 3x10 - 25# - lat pull down - 3x10 - 25# Modalities: - MHP patient in prone x 8 mins post session  TREATMENT 11/18/22: Therapeutic Exercise: - Nustep level 5 x 5 mins - sidelying clam with GTB - 2x10 ea - pilates ring squeeze - 5'' hold - 2x10 - bridge with ball 2x10 - supine 90/90 isometric hold 2x20" - Supine horizontal abd with GTB - 2x10 - diagonals with GTB - x10 BIL - low row - 3x10 - 20# - high row - 3x10 - 20# - lat pull down - 3x10 - 20#  TREATMENT 11/12/22: Therapeutic Exercise: - Nustep level 5 x 5 mins - Alternating clam with GTB - 3x10 ea - pilates ring squeeze - 5'' hold - 2x10 - Supine horizontal abd with GTB - 3x10 - diagonals with GTB - 2x10 - low row - 3x10 - 20# - high row - 3x10 - 20# - lat pull down - 3x10 - 20#     ASSESSMENT:   CLINICAL IMPRESSION: Patient presents to PT with increased pain today  and reports HEP non-compliance due to recent life stressors. Session  today continued to focus on periscapular and proximal hip strengthening with modalities utilized to decrease tension and accommodate her increased pain. Patient continues to benefit from skilled PT services and should be progressed as able to improve functional independence.     OBJECTIVE IMPAIRMENTS: Pain, periscapular weakness, hip and core weakness   ACTIVITY LIMITATIONS: lifting, work   PERSONAL FACTORS: See medical history and pertinent history     REHAB POTENTIAL: Fair chronic   CLINICAL DECISION MAKING: Stable/uncomplicated   EVALUATION COMPLEXITY: Low     GOALS:   SHORT TERM GOALS: Marisa Yu will be >75% HEP compliant throughout therapy to improve carryover between sessions and facilitate independent management of condition.   LONG TERM GOALS:    Marisa Yu will self report >/= 50% decrease in pain from evaluation    Evaluation/Baseline (10/28/2022): 10/10 max pain Target date: 12/23/2022 Goal status: INITIAL    2.  Marisa Yu will improve the following MMTs to >/= 4/5 to show improvement in strength:  mid and lower traps; ability to perform SL bridge    Evaluation/Baseline (10/28/2022): see chart in note Target date: 12/23/2022 Goal status: INITIAL   3.  Marisa Yu will report confidence in self management of condition at time of discharge with advanced HEP   Evaluation/Baseline (10/28/2022): unable to self manage Target date: 12/23/2022 Goal status: INITIAL       PLAN: PT FREQUENCY: 1-2x/week   PT DURATION: 8 weeks (Ending 12/23/2022)   PLANNED INTERVENTIONS: Therapeutic exercises, Aquatic therapy, Therapeutic activity, Neuro Muscular re-education, Gait training, Patient/Family education, Joint mobilization, Dry Needling, Electrical stimulation, Spinal mobilization and/or manipulation, Moist heat, Taping, Vasopneumatic device, Ionotophoresis '4mg'$ /ml Dexamethasone, and Manual therapy   PLAN FOR NEXT SESSION: Progressive throacic mobility along with postural muscle  strengthening   Margarette Canada PTA 11/25/2022, 6:27 PM

## 2022-12-03 ENCOUNTER — Ambulatory Visit: Payer: BC Managed Care – PPO

## 2022-12-03 DIAGNOSIS — M546 Pain in thoracic spine: Secondary | ICD-10-CM

## 2022-12-03 DIAGNOSIS — M6281 Muscle weakness (generalized): Secondary | ICD-10-CM | POA: Diagnosis not present

## 2022-12-03 DIAGNOSIS — M5459 Other low back pain: Secondary | ICD-10-CM | POA: Diagnosis not present

## 2022-12-03 NOTE — Therapy (Addendum)
PHYSICAL THERAPY DISCHARGE SUMMARY  Visits from Start of Care: 6  Current functional level related to goals / functional outcomes: See assessment/goals   Remaining deficits: See assessment/goals   Education / Equipment: HEP and D/C plans  Patient agrees to discharge. Patient goals were not met. Patient is being discharged due to lack of progress.  OUTPATIENT PHYSICAL THERAPY TREATMENT NOTE   Patient Name: Marisa Yu MRN: [id number] DOB:02-21-1986, 37 y.o., female Today's Date: 12/03/2022  PCP: Dorna Mai, MD   REFERRING PROVIDER: Dorna Mai, MD   PT End of Session - 12/03/22 1830     Visit Number 6    Date for PT Re-Evaluation 12/23/22    Authorization Type BCBS    PT Start Time 1830    PT Stop Time 1910    PT Time Calculation (min) 40 min    Activity Tolerance Patient tolerated treatment well    Behavior During Therapy [facility] for tasks assessed/performed               Past Medical History:  Diagnosis Date   Anemia    Anxiety    Boil, arm    Family history of BRCA1 gene positive 01/18/2018   Family history of breast cancer    GERD (gastroesophageal reflux disease)    Headache    Hypertension    Hypothyroidism    Tooth ache    Vaginal Pap smear, abnormal    Past Surgical History:  Procedure Laterality Date   CESAREAN SECTION  2004   CESAREAN SECTION  2009   CESAREAN SECTION WITH BILATERAL TUBAL LIGATION Bilateral 08/21/2015   Procedure: CESAREAN SECTION WITH BILATERAL TUBAL LIGATION;  Surgeon: Shelly Bombard, MD;  Location: [facility];  Service: Obstetrics;  Laterality: Bilateral;   CYST EXCISION     WISDOM TOOTH EXTRACTION     Patient Active Problem List   Diagnosis Date Noted   Visit for routine gyn exam 03/19/2022   Family history of breast cancer 03/17/2020   Genetic testing 04/15/2018   Family history of BRCA1 gene positive 01/18/2018   Prediabetes 11/29/2017   S/P cesarean section 08/21/2015    THERAPY DIAG:  Pain in  thoracic spine  Other low back pain  Muscle weakness   Rationale for Evaluation and Treatment Rehabilitation  REFERRING DIAG: Hypertrophy of breast [N62], Neck and shoulder pain [M54.2, M25.519]    PERTINENT HISTORY: none  PRECAUTIONS/RESTRICTIONS:   none  SUBJECTIVE:  Patient reports continued pain, occasional HEP compliance.   Pain:  Are you having pain? Yes Pain location: neck, mid back, and low back pain NPRS scale:  8/10 to 10/10 Aggravating factors: work (lifting and moving objects - up to 75 lbs) Relieving factors: take NSAID Pain description: aching Stage: Chronic Stability: getting worse 24 hour pattern: worse with activity   OBJECTIVE: (objective measures completed at initial evaluation unless otherwise dated)  GENERAL OBSERVATION:          Mammary hypertrophy, forward head, rounded shoulders                               SENSATION:          Light touch: Appears intact           PALPATION: TTP thoracic and lumbar paraspinals   LUMBAR AROM   AROM AROM  10/28/2022  Flexion Fingertips to toes (WNL), w/ concordant pain  Extension WNL, w/ concordant pain  Right lateral flexion WNL, w/  concordant pain  Left lateral flexion WNL, w/ concordant pain  Right rotation WNL, w/ concordant pain  Left rotation WNL, w/ concordant pain    (Blank rows = not tested)      Throacic AROM   AROM AROM  10/28/2022  Flexion    Extension WNL, w/ concordant pain  Right lateral flexion    Left lateral flexion    Right rotation WNL, w/ concordant pain  Left rotation WNL, w/ concordant pain    (Blank rows = not tested)   Cervical AROM: WNL   UPPER EXTREMITY MMT:   MMT Right 10/28/2022 Left 10/28/2022 Right 12/03/22 Left 12/03/22  Shoulder flexion        Shoulder abduction (C5)        Shoulder ER        Shoulder IR        Middle trapezius 3+ 3+ 3+ 3+  Lower trapezius 3 3 3+ 3+  Shoulder extension        Grip strength        Cervical flexion (C1,C2)         Cervical S/B (C3)        Shoulder shrug (C4)        Elbow flexion (C6)        Elbow ext (C7)        Thumb ext (C8)        Finger abd (T1)        Grossly          (Blank rows = not tested, score listed is out of 5 possible points.  N = WNL, D = diminished, C = clear for gross weakness with myotome testing, * = concordant pain with testing)   ASLR=PSLF bil   Single leg bridge - unable full ROM with pelvic drop bil   JOINT MOBILITY TESTING:  Hypomobile throughout thoracic spine     TODAY'S TREATMENT:  Creating, reviewing, and completing below HEP     PATIENT EDUCATION:  POC, diagnosis, prognosis, HEP.  Pt educated via explanation, demonstration, and handout (HEP).  Pt confirms understanding verbally.      HOME EXERCISE PROGRAM:   Access Code: [email or web address] URL: [email or web address] Date: 12/03/2022 Prepared by: Margarette Canada  Exercises - Sidelying Thoracic Rotation with Open Book  - 1 x daily - 7 x weekly - 1 sets - 20 reps - 2 hold - Supine Bridge  - 1 x daily - 7 x weekly - 3 sets - 10 reps - Standing Shoulder Diagonal Horizontal Abduction 60/120 Degrees with Resistance  - 1 x daily - 7 x weekly - 2 sets - 10 reps - Standing Shoulder Row with Anchored Resistance  - 1 x daily - 7 x weekly - 3 sets - 10 reps - Shoulder extension with resistance - Neutral  - 1 x daily - 7 x weekly - 3 sets - 10 reps - Shoulder External Rotation and Scapular Retraction with Resistance  - 1 x daily - 7 x weekly - 3 sets - 10 reps - Standing Shoulder Horizontal Abduction with Resistance  - 1 x daily - 7 x weekly - 3 sets - 10 reps  TREATMENT 12/03/22: Therapeutic Exercise: - Nustep level 5 x 5 mins Therapeutic Activity: -Review of goals, HEP Modalities: - MHP patient in prone x 8 mins post session  TREATMENT 11/25/22: Therapeutic Exercise: - Nustep level 5 x 5 mins - pilates ring squeeze - 5'' hold - 2x10 - bridge with ball 2x10 - Supine horizontal  abd with GTB - 2x10 -  diagonals with GTB - x10 BIL - low row - 3x10 - 25# - high row - 3x10 - 25# - lat pull down - 3x10 - 25# Modalities: - MHP patient in prone x 8 mins post session  TREATMENT 11/18/22: Therapeutic Exercise: - Nustep level 5 x 5 mins - sidelying clam with GTB - 2x10 ea - pilates ring squeeze - 5'' hold - 2x10 - bridge with ball 2x10 - supine 90/90 isometric hold 2x20" - Supine horizontal abd with GTB - 2x10 - diagonals with GTB - x10 BIL - low row - 3x10 - 20# - high row - 3x10 - 20# - lat pull down - 3x10 - 20#    ASSESSMENT:   CLINICAL IMPRESSION: Patient presents to PT with continued pain in her mid and lower back. Updated and reviewed HEP with patient. Patient has made minimal progress throughout PT, pain remains at 10/10 at the worst, and patient reports minimal functional improvement. Patient is appropriate for DC from PT at this time.   OBJECTIVE IMPAIRMENTS: Pain, periscapular weakness, hip and core weakness   ACTIVITY LIMITATIONS: lifting, work   PERSONAL FACTORS: See medical history and pertinent history     REHAB POTENTIAL: Fair chronic   CLINICAL DECISION MAKING: Stable/uncomplicated   EVALUATION COMPLEXITY: Low     GOALS:   SHORT TERM GOALS: Marisa Yu will be >75% HEP compliant throughout therapy to improve carryover between sessions and facilitate independent management of condition.   LONG TERM GOALS:    Marisa Yu will self report >/= 50% decrease in pain from evaluation    Evaluation/Baseline (10/28/2022): 10/10 max pain Target date: 12/23/2022 Goal status: Not MET 12/03/22: 10/10    2.  Marisa Yu will improve the following MMTs to >/= 4/5 to show improvement in strength:  mid and lower traps; ability to perform SL bridge    Evaluation/Baseline (10/28/2022): see chart in note Target date: 12/23/2022 Goal status: Not MET 12/03/22: See above chart   3.  Marisa Yu will report confidence in self management of condition at time of discharge with advanced HEP    Evaluation/Baseline (10/28/2022): unable to self manage Target date: 12/23/2022 Goal status: MET       PLAN: PT FREQUENCY: 1-2x/week   PT DURATION: 8 weeks (Ending 12/23/2022)   PLANNED INTERVENTIONS: Therapeutic exercises, Aquatic therapy, Therapeutic activity, Neuro Muscular re-education, Gait training, Patient/Family education, Joint mobilization, Dry Needling, Electrical stimulation, Spinal mobilization and/or manipulation, Moist heat, Taping, Vasopneumatic device, Ionotophoresis 4mg /ml Dexamethasone, and Manual therapy   PLAN FOR NEXT SESSION: Progressive throacic mobility along with postural muscle strengthening   Margarette Canada PTA 12/03/2022, 6:31 PM

## 2022-12-07 ENCOUNTER — Ambulatory Visit (INDEPENDENT_AMBULATORY_CARE_PROVIDER_SITE_OTHER): Payer: BC Managed Care – PPO | Admitting: Family Medicine

## 2022-12-07 VITALS — BP 136/90 | HR 89 | Temp 98.1°F | Resp 16 | Wt 226.0 lb

## 2022-12-07 DIAGNOSIS — I10 Essential (primary) hypertension: Secondary | ICD-10-CM | POA: Diagnosis not present

## 2022-12-07 DIAGNOSIS — Z7689 Persons encountering health services in other specified circumstances: Secondary | ICD-10-CM | POA: Diagnosis not present

## 2022-12-07 DIAGNOSIS — Z6837 Body mass index (BMI) 37.0-37.9, adult: Secondary | ICD-10-CM

## 2022-12-09 ENCOUNTER — Encounter: Payer: Self-pay | Admitting: Family Medicine

## 2022-12-09 ENCOUNTER — Ambulatory Visit: Payer: BC Managed Care – PPO | Admitting: Student

## 2022-12-09 NOTE — Progress Notes (Signed)
Established Patient Office Visit  Subjective    Patient ID: Marisa Yu, female    DOB: 12/11/1985  Age: 37 y.o. MRN: [id number]  CC:  Chief Complaint  Patient presents with   Weight Check    HPI Marisa Yu presents to establish care   Outpatient Encounter Medications as of 12/07/2022  Medication Sig   losartan (COZAAR) 100 MG tablet TAKE 1 TABLET(100 MG) BY MOUTH DAILY   phentermine 37.5 MG capsule Take 1 capsule (37.5 mg total) by mouth every morning.   No facility-administered encounter medications on file as of 12/07/2022.    Past Medical History:  Diagnosis Date   Anemia    Anxiety    Boil, arm    Family history of BRCA1 gene positive 01/18/2018   Family history of breast cancer    GERD (gastroesophageal reflux disease)    Headache    Hypertension    Hypothyroidism    Tooth ache    Vaginal Pap smear, abnormal     Past Surgical History:  Procedure Laterality Date   CESAREAN SECTION  2004   CESAREAN SECTION  2009   CESAREAN SECTION WITH BILATERAL TUBAL LIGATION Bilateral 08/21/2015   Procedure: CESAREAN SECTION WITH BILATERAL TUBAL LIGATION;  Surgeon: Shelly Bombard, MD;  Location: [facility];  Service: Obstetrics;  Laterality: Bilateral;   CYST EXCISION     WISDOM TOOTH EXTRACTION      Family History  Problem Relation Age of Onset   Cancer Mother    Breast cancer Maternal Aunt        dx 25's   Breast cancer Maternal Aunt        dx 40's   Breast cancer Maternal Grandmother    Heart disease Maternal Grandfather    Breast cancer Cousin 20       BRCA1 +   Breast cancer Cousin 36   Other Cousin        BRCA1 +   Breast cancer Cousin 36   Breast cancer Cousin 85   Asthma Son     Social History   Socioeconomic History   Marital status: Single    Spouse name: Not on file   Number of children: 2   Years of education: Not on file   Highest education level: Not on file  Occupational History   Occupation: Childcare    Employer:  [id number]  Tobacco Use   Smoking status: Former    Types: Cigarettes    Quit date: 09/22/2011    Years since quitting: 11.2   Smokeless tobacco: Never  Vaping Use   Vaping Use: Never used  Substance and Sexual Activity   Alcohol use: Yes    Comment: occ   Drug use: No   Sexual activity: Not Currently    Partners: Male    Birth control/protection: None  Other Topics Concern   Not on file  Social History Narrative   Not on file   Social Determinants of Health   Financial Resource Strain: Not on file  Food Insecurity: Not on file  Transportation Needs: Not on file  Physical Activity: Not on file  Stress: Not on file  Social Connections: Not on file  Intimate Partner Violence: Not on file    Review of Systems  All other systems reviewed and are negative.       Objective    BP (!) 136/90   Pulse 89   Temp 98.1 F (36.7 C) (Oral)   Resp 16  Wt 226 lb (102.5 kg)   SpO2 98%   BMI 37.61 kg/m   Physical Exam Vitals and nursing note reviewed.  Constitutional:      General: She is not in acute distress. Cardiovascular:     Rate and Rhythm: Normal rate and regular rhythm.  Pulmonary:     Effort: Pulmonary effort is normal.     Breath sounds: Normal breath sounds.  Abdominal:     Palpations: Abdomen is soft.     Tenderness: There is no abdominal tenderness.  Neurological:     General: No focal deficit present.     Mental Status: She is alert and oriented to person, place, and time.         Assessment & Plan:   1. Encounter for weight management Patient not doing well with present management at this time. Reviewed dietary and activity options. Patient to re-evaluate and start with dietary options of choice. Will defer phentermine for 2-3 months and then re-evaluate. Patient v.u.  2. Class 2 severe obesity due to excess calories with serious comorbidity and body mass index (BMI) of 37.0 to 37.9 in adult [facility]) As above  3. Essential hypertension Slightly  elevated reading. Continue    Return in about 3 months (around 03/09/2023) for follow up.   Becky Sax, MD

## 2022-12-10 ENCOUNTER — Ambulatory Visit: Payer: BC Managed Care – PPO | Admitting: Physician Assistant

## 2022-12-11 ENCOUNTER — Ambulatory Visit: Payer: BC Managed Care – PPO | Admitting: Student

## 2022-12-11 VITALS — Wt 222.8 lb

## 2022-12-11 DIAGNOSIS — N62 Hypertrophy of breast: Secondary | ICD-10-CM

## 2022-12-11 DIAGNOSIS — M546 Pain in thoracic spine: Secondary | ICD-10-CM

## 2022-12-11 DIAGNOSIS — M542 Cervicalgia: Secondary | ICD-10-CM | POA: Diagnosis not present

## 2022-12-11 NOTE — Progress Notes (Signed)
   Referring Provider Dorna Mai, MD [address] [place],  [place] [id number]   CC:  Chief Complaint  Patient presents with   Follow-up      Marisa Yu is an 37 y.o. female.  HPI: Patient is a 38 y.o. year old female here for follow up after completing physical therapy for pain related to macromastia.   She was seen for initial consult by Dr. Lovena Le on 09/23/2022.  At that time, patient complained of worsening upper back and neck pain that she felt were related to the large size of her breasts.  Patient at this visit reported that her breasts have been continuing to grow in size over the past few years and her pain was getting worse.  She also stated that her breasts interfere with her daily activities, specifically her work in [employer], educational.  Patient was requesting surgical reduction in the size of her breasts.  On exam, her STN on the right was 40 cm and her STN on the left was 40 cm.  Patient was noted to have very large pendulous breasts.  She was found to likely benefit from a bilateral breast reduction.  Most likely, 800 to 900 g of tissue could be removed from each breast.  The possibility of free nipple graft was discussed with the patient as well.  Physical therapy was ordered for the patient.  Today, patient reports she is doing well.  She states that she finished all 6 sessions of physical therapy.  She does states she felt like it helped with her posture little bit, but she still complains of ongoing back, neck and shoulder pain due to her enlarged breasts.  She expresses the desire to move forward with surgical intervention.  She denies any recent changes in her health.  She denies any fevers or chills.   Review of Systems General: Denies fevers or chills MSK: Endorses ongoing back and neck discomfort Skin: Reports intermittent rashes  Physical Exam    12/11/2022    9:27 AM 12/07/2022    4:24 PM 11/03/2022    4:16 PM  Vitals with BMI  Weight 222 lbs  13 oz 226 lbs [id number] lbs  Systolic  [id number]  Diastolic  90 95  Pulse  89 91    General:  No acute distress,  Alert and oriented, Non-Toxic, Normal speech and affect Psych: Normal behavior and mood Respiratory: No increased WOB MSK: Ambulatory  Assessment/Plan  Hypertrophy of breast   Patient is interested in pursuing surgical intervention for bilateral breast reduction. Patient has completed at least 6 weeks of physical therapy for pain related to macromastia.  Discussed with patient we would submit to insurance for authorization, discussed approval could take up to 6 weeks.   I instructed the patient to call if she has any questions or concerns.  Clance Boll 12/11/2022, 10:09 AM

## 2022-12-28 DIAGNOSIS — F4310 Post-traumatic stress disorder, unspecified: Secondary | ICD-10-CM | POA: Diagnosis not present

## 2023-01-18 DIAGNOSIS — F4310 Post-traumatic stress disorder, unspecified: Secondary | ICD-10-CM | POA: Diagnosis not present

## 2023-01-21 ENCOUNTER — Telehealth: Payer: Self-pay | Admitting: *Deleted

## 2023-01-21 NOTE — Telephone Encounter (Signed)
Auth pending for [id number] - [id number]

## 2023-02-02 NOTE — Telephone Encounter (Addendum)
Patient called and is wanting to know rougly how much her surgery is going to be. Call back is [phone]. She said if she doesn't answer a detailed voicemail can be left

## 2023-02-02 NOTE — Telephone Encounter (Signed)
I called patient back and gave her the pre-service center number [phone] option 1 to call for estimate

## 2023-02-03 ENCOUNTER — Telehealth: Payer: Self-pay | Admitting: Plastic Surgery

## 2023-02-03 NOTE — Telephone Encounter (Signed)
Sent message via Mychart indicating her case is still pending for bilateral breast reduction.  Advised she can call the number on back of her insurance card to see what her benefits/or what her insurance would pay if authorized.

## 2023-02-03 NOTE — Telephone Encounter (Signed)
Pt wants her CPT code for sx, she stated she wants to know how much it would cost.

## 2023-02-19 ENCOUNTER — Encounter: Payer: Self-pay | Admitting: Family Medicine

## 2023-02-22 NOTE — Telephone Encounter (Signed)
I have attempted without success to contact this patient by phone to return their call and I left a message on answering machine.

## 2023-02-26 ENCOUNTER — Telehealth: Payer: Self-pay | Admitting: *Deleted

## 2023-02-26 NOTE — Telephone Encounter (Signed)
LVM to schedule surgery

## 2023-03-05 ENCOUNTER — Telehealth: Payer: Self-pay | Admitting: *Deleted

## 2023-03-05 NOTE — Telephone Encounter (Signed)
LVM to schedule surgery

## 2023-03-09 ENCOUNTER — Ambulatory Visit (INDEPENDENT_AMBULATORY_CARE_PROVIDER_SITE_OTHER): Payer: BC Managed Care – PPO | Admitting: Family Medicine

## 2023-03-09 VITALS — BP 141/91 | HR 82 | Temp 97.7°F | Resp 16 | Wt 229.0 lb

## 2023-03-09 DIAGNOSIS — Z7689 Persons encountering health services in other specified circumstances: Secondary | ICD-10-CM | POA: Diagnosis not present

## 2023-03-09 DIAGNOSIS — Z6837 Body mass index (BMI) 37.0-37.9, adult: Secondary | ICD-10-CM

## 2023-03-09 DIAGNOSIS — B351 Tinea unguium: Secondary | ICD-10-CM

## 2023-03-09 MED ORDER — PHENTERMINE HCL 37.5 MG PO CAPS: 37.5000 mg | ORAL_CAPSULE | ORAL | 0 refills | Status: DC

## 2023-03-09 MED ORDER — TERBINAFINE HCL 250 MG PO TABS: 250.0000 mg | ORAL_TABLET | Freq: Every day | ORAL | 2 refills | Status: DC

## 2023-03-09 NOTE — Progress Notes (Unsigned)
Patient came in for monthly weight check. Patient has no other concerns today  

## 2023-03-11 ENCOUNTER — Encounter: Payer: Self-pay | Admitting: Family Medicine

## 2023-03-11 NOTE — Progress Notes (Signed)
Established Patient Office Visit  Subjective    Patient ID: Marisa Yu, female    DOB: 04/29/1986  Age: 37 y.o. MRN: [id number]  CC: No chief complaint on file.   HPI Marisa Yu presents for routine  weight management as well as reporting a thickened discolored toenail.   Outpatient Encounter Medications as of 03/09/2023  Medication Sig   terbinafine (LAMISIL) 250 MG tablet Take 1 tablet (250 mg total) by mouth daily.   losartan (COZAAR) 100 MG tablet TAKE 1 TABLET(100 MG) BY MOUTH DAILY   phentermine 37.5 MG capsule Take 1 capsule (37.5 mg total) by mouth every morning.   [DISCONTINUED] phentermine 37.5 MG capsule Take 1 capsule (37.5 mg total) by mouth every morning. (Patient not taking: Reported on 12/11/2022)   No facility-administered encounter medications on file as of 03/09/2023.    Past Medical History:  Diagnosis Date   Anemia    Anxiety    Boil, arm    Family history of BRCA1 gene positive 01/18/2018   Family history of breast cancer    GERD (gastroesophageal reflux disease)    Headache    Hypertension    Hypothyroidism    Tooth ache    Vaginal Pap smear, abnormal     Past Surgical History:  Procedure Laterality Date   CESAREAN SECTION  2004   CESAREAN SECTION  2009   CESAREAN SECTION WITH BILATERAL TUBAL LIGATION Bilateral 08/21/2015   Procedure: CESAREAN SECTION WITH BILATERAL TUBAL LIGATION;  Surgeon: Brock Bad, MD;  Location: [facility];  Service: Obstetrics;  Laterality: Bilateral;   CYST EXCISION     WISDOM TOOTH EXTRACTION      Family History  Problem Relation Age of Onset   Cancer Mother    Breast cancer Maternal Aunt        dx 23's   Breast cancer Maternal Aunt        dx 40's   Breast cancer Maternal Grandmother    Heart disease Maternal Grandfather    Breast cancer Cousin 68       BRCA1 +   Breast cancer Cousin 10   Other Cousin        BRCA1 +   Breast cancer Cousin 36   Breast cancer Cousin 21   Asthma Son      Social History   Socioeconomic History   Marital status: Single    Spouse name: Not on file   Number of children: 2   Years of education: Not on file   Highest education level: Not on file  Occupational History   Occupation: Childcare    Employer: [id number]  Tobacco Use   Smoking status: Former    Types: Cigarettes    Quit date: 09/22/2011    Years since quitting: 11.4   Smokeless tobacco: Never  Vaping Use   Vaping Use: Never used  Substance and Sexual Activity   Alcohol use: Yes    Comment: occ   Drug use: No   Sexual activity: Not Currently    Partners: Male    Birth control/protection: None  Other Topics Concern   Not on file  Social History Narrative   Not on file   Social Determinants of Health   Financial Resource Strain: Not on file  Food Insecurity: Not on file  Transportation Needs: Not on file  Physical Activity: Not on file  Stress: Not on file  Social Connections: Not on file  Intimate Partner Violence: Not on file  Review of Systems  All other systems reviewed and are negative.       Objective    BP (!) 141/91   Pulse 82   Temp 97.7 F (36.5 C) (Oral)   Resp 16   Wt 229 lb (103.9 kg)   SpO2 98%   BMI 38.11 kg/m   Physical Exam Vitals and nursing note reviewed.  Constitutional:      General: She is not in acute distress.    Appearance: She is obese.  Cardiovascular:     Rate and Rhythm: Normal rate and regular rhythm.  Pulmonary:     Effort: Pulmonary effort is normal.     Breath sounds: Normal breath sounds.  Abdominal:     Palpations: Abdomen is soft.     Tenderness: There is no abdominal tenderness.  Skin:    Comments: Right great toenail is slightly thickened and with yellowish coloring  Neurological:     General: No focal deficit present.     Mental Status: She is alert and oriented to person, place, and time.         Assessment & Plan:   1. Encounter for weight management Discussed dietary and activity  options. Phentermine prescribed.   2. Class 2 severe obesity due to excess calories with serious comorbidity and body mass index (BMI) of 37.0 to 37.9 in adult (HCC)   3. Toenail fungus Lamisil prescribed    Return in about 4 weeks (around 04/06/2023) for physical.   Tommie Raymond, MD

## 2023-04-08 ENCOUNTER — Other Ambulatory Visit (HOSPITAL_COMMUNITY)
Admission: RE | Admit: 2023-04-08 | Discharge: 2023-04-08 | Disposition: A | Discharge status: Home / Self Care | Payer: BC Managed Care – PPO | Source: Ambulatory Visit | Attending: Student | Admitting: Student

## 2023-04-08 ENCOUNTER — Ambulatory Visit (INDEPENDENT_AMBULATORY_CARE_PROVIDER_SITE_OTHER): Payer: BC Managed Care – PPO

## 2023-04-08 VITALS — BP 130/87 | HR 86 | Ht 67.0 in | Wt 228.1 lb

## 2023-04-08 DIAGNOSIS — R350 Frequency of micturition: Secondary | ICD-10-CM

## 2023-04-08 DIAGNOSIS — B9689 Other specified bacterial agents as the cause of diseases classified elsewhere: Secondary | ICD-10-CM | POA: Diagnosis not present

## 2023-04-08 DIAGNOSIS — N760 Acute vaginitis: Secondary | ICD-10-CM | POA: Diagnosis not present

## 2023-04-08 DIAGNOSIS — B3731 Acute candidiasis of vulva and vagina: Secondary | ICD-10-CM | POA: Insufficient documentation

## 2023-04-08 DIAGNOSIS — R8290 Unspecified abnormal findings in urine: Secondary | ICD-10-CM | POA: Insufficient documentation

## 2023-04-08 DIAGNOSIS — Z113 Encounter for screening for infections with a predominantly sexual mode of transmission: Secondary | ICD-10-CM | POA: Diagnosis not present

## 2023-04-08 DIAGNOSIS — N390 Urinary tract infection, site not specified: Secondary | ICD-10-CM | POA: Diagnosis not present

## 2023-04-08 LAB — POCT URINALYSIS DIPSTICK
Bilirubin, UA: NEGATIVE
Glucose, UA: NEGATIVE
Ketones, UA: NEGATIVE
Protein, UA: NEGATIVE
Spec Grav, UA: 1.015 (ref 1.010–1.025)
Urobilinogen, UA: 0.2 E.U./dL
pH, UA: 7 (ref 5.0–8.0)

## 2023-04-08 NOTE — Progress Notes (Signed)
SUBJECTIVE:  37 y.o. female desires an STI screening. Denies abnormal vaginal bleeding or significant pelvic pain or fever. No UTI symptoms. Denies history of known exposure to STD.  Patient's last menstrual period was 03/22/2023 (approximate).  OBJECTIVE:  She appears well, afebrile. Urine dipstick: positive for RBC's, positive for nitrates, and positive for leukocytes - urine sent for culture.  ASSESSMENT:  Screening for STI's.  PLAN:  GC, chlamydia, trichomonas, BVAG, CVAG probe sent to lab. Urine sent for culture. Treatment: To be determined once lab results are received ROV prn if symptoms persist or worsen.

## 2023-04-09 LAB — CERVICOVAGINAL ANCILLARY ONLY
Bacterial Vaginitis (gardnerella): POSITIVE — AB
Candida Glabrata: NEGATIVE
Candida Vaginitis: POSITIVE — AB
Chlamydia: NEGATIVE
Comment: NEGATIVE
Comment: NEGATIVE
Comment: NEGATIVE
Comment: NEGATIVE
Comment: NEGATIVE
Comment: NORMAL
Neisseria Gonorrhea: NEGATIVE
Trichomonas: NEGATIVE

## 2023-04-10 ENCOUNTER — Encounter: Payer: Self-pay | Admitting: Obstetrics and Gynecology

## 2023-04-10 LAB — HEPATITIS C ANTIBODY: Hep C Virus Ab: NONREACTIVE

## 2023-04-10 LAB — HEPATITIS B SURFACE ANTIGEN: Hepatitis B Surface Ag: NEGATIVE

## 2023-04-10 LAB — RPR: RPR Ser Ql: NONREACTIVE

## 2023-04-10 LAB — HIV ANTIBODY (ROUTINE TESTING W REFLEX): HIV Screen 4th Generation wRfx: NONREACTIVE

## 2023-04-12 ENCOUNTER — Other Ambulatory Visit: Payer: Self-pay

## 2023-04-12 DIAGNOSIS — B3731 Acute candidiasis of vulva and vagina: Secondary | ICD-10-CM

## 2023-04-12 DIAGNOSIS — B9689 Other specified bacterial agents as the cause of diseases classified elsewhere: Secondary | ICD-10-CM

## 2023-04-12 MED ORDER — FLUCONAZOLE 150 MG PO TABS
150.0000 mg | ORAL_TABLET | Freq: Once | ORAL | 0 refills | Status: DC
Start: 2023-04-12 — End: 2024-01-19

## 2023-04-12 MED ORDER — METRONIDAZOLE 500 MG PO TABS
500.0000 mg | ORAL_TABLET | Freq: Two times a day (BID) | ORAL | 0 refills | Status: DC
Start: 2023-04-12 — End: 2024-01-19

## 2023-04-12 NOTE — Telephone Encounter (Signed)
Message shows as read as far as [place] trying to reach patient for surgery scheduling. Removing from active patient list due to no return contact from patient after several attempts.

## 2023-04-13 ENCOUNTER — Ambulatory Visit: Payer: BC Managed Care – PPO

## 2023-04-15 ENCOUNTER — Other Ambulatory Visit: Payer: Self-pay | Admitting: Obstetrics & Gynecology

## 2023-04-15 DIAGNOSIS — R8271 Bacteriuria: Secondary | ICD-10-CM

## 2023-04-15 LAB — URINE CULTURE

## 2023-04-15 MED ORDER — NITROFURANTOIN MONOHYD MACRO 100 MG PO CAPS: 100.0000 mg | ORAL_CAPSULE | Freq: Two times a day (BID) | ORAL | 0 refills | Status: DC

## 2023-04-15 NOTE — Progress Notes (Signed)
Meds ordered this encounter  Medications   nitrofurantoin, macrocrystal-monohydrate, (MACROBID) 100 MG capsule    Sig: Take 1 capsule (100 mg total) by mouth 2 (two) times daily.    Dispense:  10 capsule    Refill:  0    

## 2023-04-16 ENCOUNTER — Ambulatory Visit: Payer: Self-pay

## 2023-04-16 NOTE — Telephone Encounter (Signed)
Message from [place] sent at 04/16/2023 10:56 AM EDT  Summary: Positive for E.Coli   Pt has tested positive for E.Coli and was told by the health department and her OBGYN to contact her PCP,  She has a CPE Monday but cannot wait until then to be seen.         Chief Complaint: rectal bleeding episode x 1 Symptoms: had constipation at the time Frequency: last week x 1  Pertinent Negatives: Patient denies discharge, fever Disposition: [] ED /[] Urgent Care (no appt availability in office) / [] Appointment(In office/virtual)/ []  [facility]/ [] Home Care/ [] Refused Recommended Disposition /[] [place] [facility]/ []  Follow-up with PCP Additional Notes: discussed with pt her e coli UTI, and ph of vagina. (Pt had been using deodorant suppositories x 2 and feels that is the cause of her BV, Pt advised to eat yogurt, eat bland foods, take imodium per pkg instructions, add salt to diet, to call back if having more rectal bleeding. Advised to try drink more water and eat food with fiber. Advised to avoid using perfumed soap to vulvar area, to wear underwear wit cotton crotch, to sleep without underwear.Pt verbalized understanding. Sent MyChart message with recommendations.   Reason for Disposition  Rectal bleeding is minimal (e.g., blood just on toilet paper, a few drops in toilet bowl)  Answer Assessment - Initial Assessment Questions 1. APPEARANCE of BLOOD: "What color is it?" "Is it passed separately, on the surface of the stool, or mixed in with the stool?"      Bright red/ separately 2. AMOUNT: "How much blood was passed?"      Smear  3. FREQUENCY: "How many times has blood been passed with the stools?"      1 time 4. ONSET: "When was the blood first seen in the stools?" (Days or weeks)      weekend 5. DIARRHEA: "Is there also some diarrhea?" If Yes, ask: "How many diarrhea stools in the past 24 hours?"      Now with diarrhea 6. CONSTIPATION: "Do you have constipation?" If  Yes, ask: "How bad is it?"     yes 7. RECURRENT SYMPTOMS: "Have you had blood in your stools before?" If Yes, ask: "When was the last time?" and "What happened that time?"      no 8. BLOOD THINNERS: "Do you take any blood thinners?" (e.g., Coumadin/warfarin, Pradaxa/dabigatran, aspirin)     no 9. OTHER SYMPTOMS: "Do you have any other symptoms?"  (e.g., abdomen pain, vomiting, dizziness, fever)     Pelvic pressure,  Protocols used: Rectal Bleeding-A-AH

## 2023-04-19 ENCOUNTER — Encounter: Payer: BC Managed Care – PPO | Admitting: Family Medicine

## 2023-06-14 ENCOUNTER — Encounter: Payer: BC Managed Care – PPO | Admitting: Family Medicine

## 2023-06-24 ENCOUNTER — Encounter: Payer: Self-pay | Admitting: Obstetrics

## 2023-06-26 DIAGNOSIS — R058 Other specified cough: Secondary | ICD-10-CM | POA: Diagnosis not present

## 2023-06-26 DIAGNOSIS — J0110 Acute frontal sinusitis, unspecified: Secondary | ICD-10-CM | POA: Diagnosis not present

## 2023-07-16 ENCOUNTER — Ambulatory Visit (INDEPENDENT_AMBULATORY_CARE_PROVIDER_SITE_OTHER): Payer: BC Managed Care – PPO | Admitting: Family Medicine

## 2023-07-16 ENCOUNTER — Encounter: Payer: Self-pay | Admitting: Family Medicine

## 2023-07-16 VITALS — BP 138/88 | HR 73 | Temp 98.6°F | Resp 16 | Ht 66.0 in | Wt 232.0 lb

## 2023-07-16 DIAGNOSIS — B351 Tinea unguium: Secondary | ICD-10-CM | POA: Diagnosis not present

## 2023-07-16 DIAGNOSIS — F909 Attention-deficit hyperactivity disorder, unspecified type: Secondary | ICD-10-CM

## 2023-07-16 DIAGNOSIS — L700 Acne vulgaris: Secondary | ICD-10-CM

## 2023-07-16 DIAGNOSIS — M79674 Pain in right toe(s): Secondary | ICD-10-CM | POA: Diagnosis not present

## 2023-07-16 MED ORDER — MINOCYCLINE HCL 100 MG PO CAPS: 100.0000 mg | ORAL_CAPSULE | Freq: Two times a day (BID) | ORAL | 2 refills | Status: DC

## 2023-07-21 ENCOUNTER — Encounter: Payer: Self-pay | Admitting: Family Medicine

## 2023-07-21 ENCOUNTER — Ambulatory Visit (INDEPENDENT_AMBULATORY_CARE_PROVIDER_SITE_OTHER): Payer: BC Managed Care – PPO | Admitting: Family Medicine

## 2023-07-21 VITALS — BP 129/89 | HR 83 | Temp 97.8°F | Resp 16 | Ht 66.0 in | Wt 231.0 lb

## 2023-07-21 DIAGNOSIS — Z0000 Encounter for general adult medical examination without abnormal findings: Secondary | ICD-10-CM | POA: Diagnosis not present

## 2023-07-21 DIAGNOSIS — Z13220 Encounter for screening for lipoid disorders: Secondary | ICD-10-CM

## 2023-07-21 DIAGNOSIS — Z130 Encounter for screening for diseases of the blood and blood-forming organs and certain disorders involving the immune mechanism: Secondary | ICD-10-CM

## 2023-07-21 NOTE — Progress Notes (Signed)
Established Patient Office Visit  Subjective    Patient ID: Marisa Yu, female    DOB: 1986/06/13  Age: 37 y.o. MRN: [id number]  CC:  Chief Complaint  Patient presents with   Nail Problem    HPI Marisa Yu presents for complaint of pain of toenail 2/2 fungus. She also reports that she has acne that is not well controlled. She is following up on ADHD as well.   Outpatient Encounter Medications as of 07/16/2023  Medication Sig   losartan (COZAAR) 100 MG tablet TAKE 1 TABLET(100 MG) BY MOUTH DAILY   minocycline (MINOCIN) 100 MG capsule Take 1 capsule (100 mg total) by mouth 2 (two) times daily.   nitrofurantoin, macrocrystal-monohydrate, (MACROBID) 100 MG capsule Take 1 capsule (100 mg total) by mouth 2 (two) times daily.   terbinafine (LAMISIL) 250 MG tablet Take 1 tablet (250 mg total) by mouth daily.   phentermine 37.5 MG capsule Take 1 capsule (37.5 mg total) by mouth every morning.   No facility-administered encounter medications on file as of 07/16/2023.    Past Medical History:  Diagnosis Date   Anemia    Anxiety    Boil, arm    Family history of BRCA1 gene positive 01/18/2018   Family history of breast cancer    GERD (gastroesophageal reflux disease)    Headache    Hypertension    Hypothyroidism    Tooth ache    Vaginal Pap smear, abnormal     Past Surgical History:  Procedure Laterality Date   CESAREAN SECTION  2004   CESAREAN SECTION  2009   CESAREAN SECTION WITH BILATERAL TUBAL LIGATION Bilateral 08/21/2015   Procedure: CESAREAN SECTION WITH BILATERAL TUBAL LIGATION;  Surgeon: Brock Bad, MD;  Location: [facility];  Service: Obstetrics;  Laterality: Bilateral;   CYST EXCISION     WISDOM TOOTH EXTRACTION      Family History  Problem Relation Age of Onset   Cancer Mother    Breast cancer Maternal Aunt        dx 60's   Breast cancer Maternal Aunt        dx 40's   Breast cancer Maternal Grandmother    Heart disease Maternal  Grandfather    Breast cancer Cousin 51       BRCA1 +   Breast cancer Cousin 75   Other Cousin        BRCA1 +   Breast cancer Cousin 36   Breast cancer Cousin 45   Asthma Son     Social History   Socioeconomic History   Marital status: Single    Spouse name: Not on file   Number of children: 2   Years of education: Not on file   Highest education level: Not on file  Occupational History   Occupation: Childcare    Employer: [id number]  Tobacco Use   Smoking status: Former    Current packs/day: 0.00    Types: Cigarettes    Quit date: 09/22/2011    Years since quitting: 11.8   Smokeless tobacco: Never  Vaping Use   Vaping status: Never Used  Substance and Sexual Activity   Alcohol use: Yes    Comment: occ   Drug use: No   Sexual activity: Not Currently    Partners: Male    Birth control/protection: None  Other Topics Concern   Not on file  Social History Narrative   Not on file   Social Determinants of Health   Financial  Resource Strain: Medium Risk (07/16/2023)   Overall Financial Resource Strain (CARDIA)    Difficulty of Paying Living Expenses: Somewhat hard  Food Insecurity: No Food Insecurity (07/16/2023)   Hunger Vital Sign    Worried About Running Out of Food in the Last Year: Never true    Ran Out of Food in the Last Year: Never true  Transportation Needs: No Transportation Needs (07/16/2023)   PRAPARE - Administrator, Civil Service (Medical): No    Lack of Transportation (Non-Medical): No  Physical Activity: Sufficiently Active (07/16/2023)   Exercise Vital Sign    Days of Exercise per Week: 5 days    Minutes of Exercise per Session: 30 min  Stress: Stress Concern Present (07/16/2023)   [employer] of Occupational Health - Occupational Stress Questionnaire    Feeling of Stress : To some extent  Social Connections: Moderately Isolated (07/16/2023)   Social Connection and Isolation Panel [NHANES]    Frequency of Communication with  Friends and Family: More than three times a week    Frequency of Social Gatherings with Friends and Family: Never    Attends Religious Services: More than 4 times per year    Active Member of [employer] or Organizations: No    Attends Banker Meetings: Never    Marital Status: Never married  Intimate Partner Violence: Not At Risk (07/16/2023)   Humiliation, Afraid, Rape, and Kick questionnaire    Fear of Current or Ex-Partner: No    Emotionally Abused: No    Physically Abused: No    Sexually Abused: No    Review of Systems  All other systems reviewed and are negative.       Objective    BP 138/88 (BP Location: Right Arm, Patient Position: Sitting, Cuff Size: Large)   Pulse 73   Temp 98.6 F (37 C) (Temporal)   Resp 16   Ht 5\' 6"  (1.676 m)   Wt 232 lb (105.2 kg)   SpO2 98%   BMI 37.45 kg/m   Physical Exam Vitals and nursing note reviewed.  Constitutional:      General: She is not in acute distress. Cardiovascular:     Rate and Rhythm: Normal rate and regular rhythm.  Pulmonary:     Effort: Pulmonary effort is normal.     Breath sounds: Normal breath sounds.  Abdominal:     Palpations: Abdomen is soft.     Tenderness: There is no abdominal tenderness.  Skin:    Findings: Acne present.     Comments: Right foot toenail with thickening and discoloration  Neurological:     General: No focal deficit present.     Mental Status: She is alert and oriented to person, place, and time.         Assessment & Plan:  1. Pain due to onychomycosis of toenail of right foot Referred to podiatry for further eval/mgt - Ambulatory referral to Podiatry  2. Acne vulgaris Skin care discussed. Minocin prescribed.   3. Attention deficit hyperactivity disorder (ADHD), unspecified ADHD type Appears stable. Continue    No follow-ups on file.   Tommie Raymond, MD

## 2023-07-22 ENCOUNTER — Encounter: Payer: Self-pay | Admitting: Family Medicine

## 2023-07-22 LAB — COMPREHENSIVE METABOLIC PANEL
ALT: 11 [IU]/L (ref 0–32)
AST: 18 [IU]/L (ref 0–40)
Albumin: 4.1 g/dL (ref 3.9–4.9)
Alkaline Phosphatase: 64 [IU]/L (ref 44–121)
BUN/Creatinine Ratio: 14 (ref 9–23)
BUN: 10 mg/dL (ref 6–20)
Bilirubin Total: 0.4 mg/dL (ref 0.0–1.2)
CO2: 20 mmol/L (ref 20–29)
Calcium: 9 mg/dL (ref 8.7–10.2)
Chloride: 103 mmol/L (ref 96–106)
Creatinine, Ser: 0.72 mg/dL (ref 0.57–1.00)
Globulin, Total: 3 g/dL (ref 1.5–4.5)
Glucose: 94 mg/dL (ref 70–99)
Potassium: 4.6 mmol/L (ref 3.5–5.2)
Sodium: 139 mmol/L (ref 134–144)
Total Protein: 7.1 g/dL (ref 6.0–8.5)
eGFR: 110 mL/min/{1.73_m2} (ref >59)

## 2023-07-22 LAB — CBC WITH DIFFERENTIAL/PLATELET
Basophils Absolute: 0 10*3/uL (ref 0.0–0.2)
Basos: 1 %
EOS (ABSOLUTE): 0.1 10*3/uL (ref 0.0–0.4)
Eos: 1 %
Hematocrit: 31.8 % — ABNORMAL LOW (ref 34.0–46.6)
Hemoglobin: 8.9 g/dL — ABNORMAL LOW (ref 11.1–15.9)
Immature Grans (Abs): 0 10*3/uL (ref 0.0–0.1)
Immature Granulocytes: 0 %
Lymphocytes Absolute: 1.8 10*3/uL (ref 0.7–3.1)
Lymphs: 34 %
MCH: 20.6 pg — ABNORMAL LOW (ref 26.6–33.0)
MCHC: 28 g/dL — ABNORMAL LOW (ref 31.5–35.7)
MCV: 73 fL — ABNORMAL LOW (ref 79–97)
Monocytes Absolute: 0.4 10*3/uL (ref 0.1–0.9)
Monocytes: 7 %
Neutrophils Absolute: 3 10*3/uL (ref 1.4–7.0)
Neutrophils: 57 %
Platelets: 466 10*3/uL — ABNORMAL HIGH (ref 150–450)
RBC: 4.33 x10E6/uL (ref 3.77–5.28)
RDW: 17.6 % — ABNORMAL HIGH (ref 11.7–15.4)
WBC: 5.2 10*3/uL (ref 3.4–10.8)

## 2023-07-22 LAB — LIPID PANEL
Chol/HDL Ratio: 2.5 ratio (ref 0.0–4.4)
Cholesterol, Total: 144 mg/dL (ref 100–199)
HDL: 57 mg/dL (ref >39)
LDL Chol Calc (NIH): 73 mg/dL (ref 0–99)
Triglycerides: 67 mg/dL (ref 0–149)
VLDL Cholesterol Cal: 14 mg/dL (ref 5–40)

## 2023-07-22 MED ORDER — IRON (FERROUS SULFATE) 325 (65 FE) MG PO TABS: 325.0000 mg | ORAL_TABLET | Freq: Every day | ORAL | 1 refills | Status: DC

## 2023-07-22 NOTE — Progress Notes (Signed)
Established Patient Office Visit  Subjective    Patient ID: Marisa Yu, female    DOB: 03/27/1986  Age: 37 y.o. MRN: [id number]  CC:  Chief Complaint  Patient presents with   Annual Exam    HPI Marisa Yu presents for routine annual exam. Patient denies acute complaints.   Outpatient Encounter Medications as of 07/21/2023  Medication Sig   losartan (COZAAR) 100 MG tablet TAKE 1 TABLET(100 MG) BY MOUTH DAILY   minocycline (MINOCIN) 100 MG capsule Take 1 capsule (100 mg total) by mouth 2 (two) times daily.   nitrofurantoin, macrocrystal-monohydrate, (MACROBID) 100 MG capsule Take 1 capsule (100 mg total) by mouth 2 (two) times daily.   phentermine 37.5 MG capsule Take 1 capsule (37.5 mg total) by mouth every morning.   terbinafine (LAMISIL) 250 MG tablet Take 1 tablet (250 mg total) by mouth daily.   No facility-administered encounter medications on file as of 07/21/2023.    Past Medical History:  Diagnosis Date   Anemia    Anxiety    Boil, arm    Family history of BRCA1 gene positive 01/18/2018   Family history of breast cancer    GERD (gastroesophageal reflux disease)    Headache    Hypertension    Hypothyroidism    Tooth ache    Vaginal Pap smear, abnormal     Past Surgical History:  Procedure Laterality Date   CESAREAN SECTION  2004   CESAREAN SECTION  2009   CESAREAN SECTION WITH BILATERAL TUBAL LIGATION Bilateral 08/21/2015   Procedure: CESAREAN SECTION WITH BILATERAL TUBAL LIGATION;  Surgeon: Brock Bad, MD;  Location: [facility];  Service: Obstetrics;  Laterality: Bilateral;   CYST EXCISION     WISDOM TOOTH EXTRACTION      Family History  Problem Relation Age of Onset   Cancer Mother    Breast cancer Maternal Aunt        dx 30's   Breast cancer Maternal Aunt        dx 40's   Breast cancer Maternal Grandmother    Heart disease Maternal Grandfather    Breast cancer Cousin 41       BRCA1 +   Breast cancer Cousin 8   Other  Cousin        BRCA1 +   Breast cancer Cousin 36   Breast cancer Cousin 13   Asthma Son     Social History   Socioeconomic History   Marital status: Single    Spouse name: Not on file   Number of children: 2   Years of education: Not on file   Highest education level: Not on file  Occupational History   Occupation: Childcare    Employer: [id number]  Tobacco Use   Smoking status: Former    Current packs/day: 0.00    Types: Cigarettes    Quit date: 09/22/2011    Years since quitting: 11.8   Smokeless tobacco: Never  Vaping Use   Vaping status: Never Used  Substance and Sexual Activity   Alcohol use: Yes    Comment: occ   Drug use: No   Sexual activity: Not Currently    Partners: Male    Birth control/protection: None  Other Topics Concern   Not on file  Social History Narrative   Not on file   Social Determinants of Health   Financial Resource Strain: Medium Risk (07/16/2023)   Overall Financial Resource Strain (CARDIA)    Difficulty of Paying Living Expenses:  Somewhat hard  Food Insecurity: No Food Insecurity (07/16/2023)   Hunger Vital Sign    Worried About Running Out of Food in the Last Year: Never true    Ran Out of Food in the Last Year: Never true  Transportation Needs: No Transportation Needs (07/16/2023)   PRAPARE - Administrator, Civil Service (Medical): No    Lack of Transportation (Non-Medical): No  Physical Activity: Sufficiently Active (07/16/2023)   Exercise Vital Sign    Days of Exercise per Week: 5 days    Minutes of Exercise per Session: 30 min  Stress: Stress Concern Present (07/16/2023)   [employer] of Occupational Health - Occupational Stress Questionnaire    Feeling of Stress : To some extent  Social Connections: Moderately Isolated (07/16/2023)   Social Connection and Isolation Panel [NHANES]    Frequency of Communication with Friends and Family: More than three times a week    Frequency of Social Gatherings with Friends  and Family: Never    Attends Religious Services: More than 4 times per year    Active Member of [employer] or Organizations: No    Attends Banker Meetings: Never    Marital Status: Never married  Intimate Partner Violence: Not At Risk (07/16/2023)   Humiliation, Afraid, Rape, and Kick questionnaire    Fear of Current or Ex-Partner: No    Emotionally Abused: No    Physically Abused: No    Sexually Abused: No    Review of Systems  All other systems reviewed and are negative.       Objective    BP 129/89 (BP Location: Right Arm, Patient Position: Sitting, Cuff Size: Large)   Pulse 83   Temp 97.8 F (36.6 C) (Oral)   Resp 16   Ht 5\' 6"  (1.676 m)   Wt 231 lb (104.8 kg)   SpO2 (!) 16%   BMI 37.28 kg/m   Physical Exam Vitals and nursing note reviewed.  Constitutional:      General: She is not in acute distress.    Appearance: She is obese.  HENT:     Head: Normocephalic and atraumatic.     Right Ear: Tympanic membrane, ear canal and external ear normal.     Left Ear: Tympanic membrane, ear canal and external ear normal.     Nose: Nose normal.     Mouth/Throat:     Mouth: Mucous membranes are moist.     Pharynx: Oropharynx is clear.  Eyes:     Conjunctiva/sclera: Conjunctivae normal.     Pupils: Pupils are equal, round, and reactive to light.  Neck:     Thyroid: No thyromegaly.  Cardiovascular:     Rate and Rhythm: Normal rate and regular rhythm.     Heart sounds: Normal heart sounds. No murmur heard. Pulmonary:     Effort: Pulmonary effort is normal. No respiratory distress.     Breath sounds: Normal breath sounds.  Abdominal:     General: There is no distension.     Palpations: Abdomen is soft. There is no mass.     Tenderness: There is no abdominal tenderness.  Musculoskeletal:        General: Normal range of motion.     Cervical back: Normal range of motion and neck supple.  Skin:    General: Skin is warm and dry.  Neurological:     General: No  focal deficit present.     Mental Status: She is alert and oriented to person,  place, and time.  Psychiatric:        Mood and Affect: Mood normal.        Behavior: Behavior normal.         Assessment & Plan:   Annual physical exam -     Comprehensive metabolic panel  Screening for deficiency anemia -     CBC with Differential/Platelet  Screening for lipid disorders -     Lipid panel     No follow-ups on file.   Tommie Raymond, MD

## 2023-07-27 ENCOUNTER — Ambulatory Visit (INDEPENDENT_AMBULATORY_CARE_PROVIDER_SITE_OTHER): Payer: BC Managed Care – PPO

## 2023-07-27 ENCOUNTER — Ambulatory Visit (INDEPENDENT_AMBULATORY_CARE_PROVIDER_SITE_OTHER): Payer: BC Managed Care – PPO | Admitting: Podiatry

## 2023-07-27 DIAGNOSIS — M778 Other enthesopathies, not elsewhere classified: Secondary | ICD-10-CM | POA: Diagnosis not present

## 2023-07-27 DIAGNOSIS — M25571 Pain in right ankle and joints of right foot: Secondary | ICD-10-CM | POA: Diagnosis not present

## 2023-07-27 DIAGNOSIS — M2141 Flat foot [pes planus] (acquired), right foot: Secondary | ICD-10-CM

## 2023-07-27 DIAGNOSIS — M2142 Flat foot [pes planus] (acquired), left foot: Secondary | ICD-10-CM | POA: Diagnosis not present

## 2023-07-27 NOTE — Patient Instructions (Addendum)
VISIT SUMMARY:  Today, you were seen for heel pain and foot discomfort, which is different from your previous plantar fasciitis. You also mentioned a foot fungus you are trying to heal. 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  YOUR PLAN:  -FLAT FEET: Flat feet occur when the arches of your feet collapse, causing pain in the front of your ankle. We gave you a corticosteroid injection to help with the pain and scheduled an appointment with orthotist Tricia for custom foot orthotics. We also advised you on the best shoes for walking and exercise and provided a home physical therapy plan to strengthen your foot muscles. If your pain continues into mid to late December, we may refer you to physical therapy.  -FOOT FUNGUS: A foot fungus was noted during your examination, but it was not the main focus of today's visit. No specific treatment plan was discussed for this issue.  INSTRUCTIONS:  Please follow up with orthotist Nicki Guadalajara for your custom foot orthotics appointment. If your pain persists into mid to late December, please contact us to discuss a possible referral to physical therapy.  Posterior Tibial Tendinitis  Posterior tibial tendinitis is irritation of a tendon called the posterior tibial tendon. Your posterior tibial tendon is a cord-like tissue that connects bones of your lower leg and foot to a muscle that: Supports your arch. Helps you raise up on your toes. Helps you turn your foot down and in. This condition causes foot and ankle pain. It can also lead to a flat foot. What are the causes? This condition is most often caused by repeated stress to the tendon (overuse injury). It can also be caused by a sudden injury that stresses the tendon, such as landing on your foot after jumping or falling. What increases the risk? This condition is more  likely to develop in: People who play a sport that involves putting a lot of pressure on the feet, such as: Basketball. Tennis. Soccer. Hockey. Runners. Females who are older than 37 years of age and are overweight. People with diabetes. People with decreased foot stability. People with flat feet. What are the signs or symptoms? Symptoms include: Pain in the inner ankle. Pain at the arch of your foot. Pain that gets worse with running, walking, or standing. Swelling on the inside of your ankle and foot. Weakness in your ankle or foot. Inability to stand up on tiptoe. Flattening of the arch of your foot. How is this diagnosed? This condition may be diagnosed based on: Your symptoms. Your medical history. A physical exam. Tests, such as: X-ray. MRI. Ultrasound. How is this treated? This condition may be treated by: Putting ice to the injured area. Taking NSAIDs, such as ibuprofen, to reduce pain and swelling. Wearing a special shoe or shoe insert to support your arch (orthotic). Having physical therapy. Replacing high-impact exercise with low-impact exercise, such as swimming or cycling. If your symptoms do not improve with these treatments, you may need to wear a splint, removable walking boot, or short leg cast for 6-8 weeks to keep your foot and ankle still (immobilized). Follow these instructions at home: If you have a cast, splint, or boot: Keep it clean and dry. Check the skin around it every day. Tell your health care provider about any concerns. If you have a cast: Do not stick anything inside  it to scratch your skin. Doing that increases your risk of infection. You may put lotion on dry skin around the edges of the cast. Do not put lotion on the skin underneath the cast. If you have a splint or boot: Wear it as told by your health care provider. Remove it only as told by your health care provider. Loosen it if your toes tingle, become numb, or turn cold and  blue. Bathing Do not take baths, swim, or use a hot tub until your health care provider approves. Ask your health care provider if you may take showers. If your cast, splint, or boot is not waterproof: Do not let it get wet. Cover it with a waterproof covering while you take a bath or a shower. Managing pain and swelling   If directed, put ice on the injured area. If you have a removable splint or boot, remove it as told by your health care provider. Put ice in a plastic bag. Place a towel between your skin and the bag or between your cast and the bag. Leave the ice on for 20 minutes, 2-3 times a day. Move your toes often to reduce stiffness and swelling. Raise (elevate) the injured area above the level of your heart while you are sitting or lying down. Activity Do not use the injured foot to support your body weight until your health care provider says that you can. Use crutches as told by your health care provider. Do not do activities that make pain or swelling worse. Ask your health care provider when it is safe to drive if you have a cast, splint, or boot on your foot. Return to your normal activities as told by your health care provider. Ask your health care provider what activities are safe for you. Do exercises as told by your health care provider. General instructions Take over-the-counter and prescription medicines only as told by your health care provider. If you have an orthotic, use it as told by your health care provider. Keep all follow-up visits as told by your health care provider. This is important. How is this prevented? Wear footwear that is appropriate to your athletic activity. Avoid athletic activities that cause pain or swelling in your ankle or foot. Before being active, do range-of-motion and stretching exercises. If you develop pain or swelling while training, stop training. If you have pain or swelling that does not improve after a few days of rest, see your  health care provider. If you start a new athletic activity, start gradually so you can build up your strength and flexibility. Contact a health care provider if: Your symptoms get worse. Your symptoms do not improve in 6-8 weeks. You develop new, unexplained symptoms. Your splint, boot, or cast gets damaged. Summary Posterior tibial tendinitis is irritation of a tendon called the posterior tibial tendon. This condition is most often caused by repeated stress to the tendon (overuse injury). This condition causes foot pain and ankle pain. It can also lead to a flat foot. This condition may be treated by not doing high-impact activities, applying ice, having physical therapy, wearing orthotics, and wearing a cast, splint, or boot if needed. This information is not intended to replace advice given to you by your health care provider. Make sure you discuss any questions you have with your health care provider. Document Revised: 01/03/2019 Document Reviewed: 11/10/2018 Elsevier Patient Education  2020 [employer]  Posterior Tibial Tendinitis Rehab Ask your health care provider which exercises are safe for  you. Do exercises exactly as told by your health care provider and adjust them as directed. It is normal to feel mild stretching, pulling, tightness, or discomfort as you do these exercises. Stop right away if you feel sudden pain or your pain gets worse. Do not begin these exercises until told by your health care provider. Stretching and range-of-motion exercises These exercises warm up your muscles and joints and improve the movement and flexibility in your ankle and foot. These exercises may also help to relieve pain. Standing wall calf stretch, knee straight   Stand with your hands against a wall. Extend your left / right leg behind you, and bend your front knee slightly. If directed, place a folded washcloth under the arch of your foot for support. Point the toes of your back foot  slightly inward. Keeping your heels on the floor and your back knee straight, shift your weight toward the wall. Do not allow your back to arch. You should feel a gentle stretch in your upper left / right calf. Hold this position for 10 seconds. Repeat 10 times. Complete this exercise 2 times a day. Standing wall calf stretch, knee bent Stand with your hands against a wall. Extend your left / right leg behind you, and bend your front knee slightly. If directed, place a folded washcloth under the arch of your foot for support. Point the toes of your back foot slightly inward. Unlock your back knee so it is bent. Keep your heels on the floor. You should feel a gentle stretch deep in your lower left / right calf. Hold this position for 10 seconds. Repeat 10 times. Complete this exercise 2 times a day. Strengthening exercises These exercises build strength and endurance in your ankle and foot. Endurance is the ability to use your muscles for a long time, even after they get tired. Ankle inversion with band Secure one end of a rubber exercise band or tubing to a fixed object, such as a table leg or a pole, that will stay still when the band is pulled. Loop the other end of the band around the middle of your left / right foot. Sit on the floor facing the object with your left / right leg extended. The band or tube should be slightly tense when your foot is relaxed. Leading with your big toe, slowly bring your left / right foot and ankle inward, toward your other foot (inversion). Hold this position for 10 seconds. Slowly return your foot to the starting position. Repeat 10 times. Complete this exercise 2 times a day. Towel curls   Sit in a chair on a non-carpeted surface, and put your feet on the floor. Place a towel in front of your feet. Keeping your heel on the floor, put your left / right foot on the towel. Pull the towel toward you by grabbing the towel with your toes and curling them  under. Keep your heel on the floor while you do this. Let your toes relax. Grab the towel with your toes again. Keep going until the towel is completely underneath your foot. Repeat 10 times. Complete this exercise 2 times a day. Balance exercise This exercise improves or maintains your balance. Balance is important in preventing falls. Single leg stand Without wearing shoes, stand near a railing or in a doorway. You may hold on to the railing or door frame as needed for balance. Stand on your left / right foot. Keep your big toe down on the floor and try  to keep your arch lifted. If balancing in this position is too easy, try the exercise with your eyes closed or while standing on a pillow. Hold this position for 10 seconds. Repeat 10 times. Complete this exercise 2 times a day. This information is not intended to replace advice given to you by your health care provider. Make sure you discuss any questions you have with your health care provider.

## 2023-07-29 NOTE — Progress Notes (Signed)
  Subjective:  Patient ID: Marisa Yu, female    DOB: 21-Apr-1986,  MRN: [id number]  Chief Complaint  Patient presents with   Foot Pain    Hx of plantar fascitis. Right foot and ankle. Started awhile ago. Stands on concrete at work and is starting at post [employer] soon for new job.     Discussed the use of AI scribe software for clinical note transcription with the patient, who gave verbal consent to proceed.  History of Present Illness   The patient presents with heel pain, different from a previous episode of plantar fasciitis. The discomfort is located in the front of the ankle, with no pain in the arch. 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 The patient also mentions a foot fungus she is trying to heal. The patient has flat feet and has never used inserts or orthotics. The patient is currently wearing steel-toe shoes for work, but will need good walking shoes. The patient is open to taking anti-inflammatory medication when the discomfort bothers her and is also open to a cortisone shot to speed up the healing process.          Objective:    Physical Exam   MUSCULOSKELETAL: Pain upon palpation of the sinus trachea. No pain upon palpation of the plantar fascia along the PT tendon or medial arch. Medial arch collapse upon weight bearing. Presence of gastrocnemius equinus. SKIN: Warm and well-perfused right foot.      Xray taken today shows pes planus deformity no fracture or arthritic changes    Results   Procedure: Corticosteroid injection Description: Injection of 0.5 cc of 2% lidocaine, 0.5 cc of plain, 4 mg of dexamethasone, and 10 mg of Kenalog into the affected area. The patient tolerated it well and was dressed with a bandage. Informed Consent: Discussion of the risks, benefits, and alternatives to the corticosteroid injection. The patient was informed that the injection would speed up the process and  could still take naproxen and ibuprofen if needed.      Assessment:   1. Sinus tarsi syndrome of right ankle   2. Pes planus of both feet      Plan:  Patient was evaluated and treated and all questions answered.  Assessment and Plan    Flat Feet Due to arch collapse and jamming of the ankle joint into the leg bone, she experiences pain in the front of the ankle without arch pain. Gastrocnemius equinus is noted. We administered a corticosteroid injection into the affected area and scheduled an appointment with orthotist, Nicki Guadalajara, for custom molded foot orthosis planning and casting. We advised on appropriate shoe gear for walking and exercise and provided a home physical therapy plan to strengthen medial musculature and reduce equine contracture in the posterior compartment. If pain persists by mid to late December, we will consider a referral to physical therapy.  Foot Fungus Noted during examination but was not the primary concern of the visit. No specific plan was discussed.          Return if symptoms worsen or fail to improve.

## 2023-08-11 ENCOUNTER — Ambulatory Visit: Payer: BC Managed Care – PPO

## 2023-08-11 NOTE — Progress Notes (Signed)
 Orthotic eval   Patient was seen, measured / scanned for custom molded foot orthotics.  Patient will benefit from CFO's as they will help provide total contact to MLA's helping to better distribute body weight across BIL feet greater reducing plantar pressure and pain and to also encourage FF and RF alignment.  Patient was scanned items to be ordered and fit when in  Marisa Yu CPed,CFo, CFm

## 2023-08-23 ENCOUNTER — Ambulatory Visit: Payer: BC Managed Care – PPO | Admitting: Family Medicine

## 2023-08-23 ENCOUNTER — Other Ambulatory Visit: Payer: BC Managed Care – PPO

## 2023-12-14 ENCOUNTER — Encounter: Payer: Self-pay | Admitting: Family Medicine

## 2023-12-15 ENCOUNTER — Other Ambulatory Visit: Payer: Self-pay | Admitting: Family Medicine

## 2023-12-15 DIAGNOSIS — M25579 Pain in unspecified ankle and joints of unspecified foot: Secondary | ICD-10-CM

## 2023-12-21 ENCOUNTER — Ambulatory Visit: Admitting: Podiatry

## 2023-12-28 ENCOUNTER — Ambulatory Visit: Admitting: Podiatry

## 2023-12-28 DIAGNOSIS — L603 Nail dystrophy: Secondary | ICD-10-CM | POA: Diagnosis not present

## 2023-12-28 NOTE — Progress Notes (Signed)
 She presents today concerned about her hallux nail right.  She states when the pain was off the nail last time she noticed a dark streak running from proximal to distal.  She was also provided Lamisil tablets by her primary care provider 90-day supply which did nothing to correct the discoloration.  Objective: Vital signs are stable alert oriented x 3 pulses are palpable.  She still retains nail polish to the right hallux.  Easily see discoloration of the melanotic streak through the nail polish and into the distal portion of the keratin and the toenail.  Assessment: Nail dystrophy cannot rule out melanotic tissue without sample.  Plan: Samples of skin and nail were taken today pathology pending.

## 2023-12-29 ENCOUNTER — Ambulatory Visit (INDEPENDENT_AMBULATORY_CARE_PROVIDER_SITE_OTHER): Admitting: Family Medicine

## 2023-12-29 ENCOUNTER — Encounter: Payer: Self-pay | Admitting: Family Medicine

## 2023-12-29 ENCOUNTER — Ambulatory Visit: Attending: Family Medicine

## 2023-12-29 VITALS — BP 142/94 | HR 82 | Wt 256.0 lb

## 2023-12-29 DIAGNOSIS — Z0289 Encounter for other administrative examinations: Secondary | ICD-10-CM | POA: Diagnosis not present

## 2023-12-29 DIAGNOSIS — Z6841 Body Mass Index (BMI) 40.0 and over, adult: Secondary | ICD-10-CM

## 2023-12-29 DIAGNOSIS — I10 Essential (primary) hypertension: Secondary | ICD-10-CM

## 2023-12-29 DIAGNOSIS — E66813 Obesity, class 3: Secondary | ICD-10-CM | POA: Diagnosis not present

## 2023-12-29 DIAGNOSIS — R002 Palpitations: Secondary | ICD-10-CM

## 2023-12-29 MED ORDER — LOSARTAN POTASSIUM 50 MG PO TABS: 50.0000 mg | ORAL_TABLET | Freq: Every day | ORAL | 0 refills | Status: DC

## 2023-12-29 NOTE — Progress Notes (Unsigned)
 EP to read.

## 2023-12-31 ENCOUNTER — Encounter: Payer: Self-pay | Admitting: Family Medicine

## 2023-12-31 NOTE — Progress Notes (Signed)
 Established Patient Office Visit  Subjective    Patient ID: Marisa Yu, female    DOB: 1986/01/06  Age: 38 y.o. MRN: [id number]  CC:  Chief Complaint  Patient presents with   paperwork     HPI Marisa Yu presents for completion of form for employer. Patient also reports episodes of palpitations.   Outpatient Encounter Medications as of 12/29/2023  Medication Sig   losartan (COZAAR) 50 MG tablet Take 1 tablet (50 mg total) by mouth daily.   No facility-administered encounter medications on file as of 12/29/2023.    Past Medical History:  Diagnosis Date   Anemia    Anxiety    Boil, arm    Family history of BRCA1 gene positive 01/18/2018   Family history of breast cancer    GERD (gastroesophageal reflux disease)    Headache    Hypertension    Hypothyroidism    Tooth ache    Vaginal Pap smear, abnormal     Past Surgical History:  Procedure Laterality Date   CESAREAN SECTION  2004   CESAREAN SECTION  2009   CESAREAN SECTION WITH BILATERAL TUBAL LIGATION Bilateral 08/21/2015   Procedure: CESAREAN SECTION WITH BILATERAL TUBAL LIGATION;  Surgeon: Brock Bad, MD;  Location: [facility];  Service: Obstetrics;  Laterality: Bilateral;   CYST EXCISION     WISDOM TOOTH EXTRACTION      Family History  Problem Relation Age of Onset   Cancer Mother    Breast cancer Maternal Aunt        dx 57's   Breast cancer Maternal Aunt        dx 40's   Breast cancer Maternal Grandmother    Heart disease Maternal Grandfather    Breast cancer Cousin 42       BRCA1 +   Breast cancer Cousin 25   Other Cousin        BRCA1 +   Breast cancer Cousin 36   Breast cancer Cousin 80   Asthma Son     Social History   Socioeconomic History   Marital status: Single    Spouse name: Not on file   Number of children: 2   Years of education: Not on file   Highest education level: Not on file  Occupational History   Occupation: Childcare    Employer: [id number]  Tobacco  Use   Smoking status: Former    Current packs/day: 0.00    Types: Cigarettes    Quit date: 09/22/2011    Years since quitting: 12.2   Smokeless tobacco: Never  Vaping Use   Vaping status: Never Used  Substance and Sexual Activity   Alcohol use: Yes    Comment: occ   Drug use: No   Sexual activity: Not Currently    Partners: Male    Birth control/protection: None  Other Topics Concern   Not on file  Social History Narrative   Not on file   Social Drivers of Health   Financial Resource Strain: Medium Risk (07/16/2023)   Overall Financial Resource Strain (CARDIA)    Difficulty of Paying Living Expenses: Somewhat hard  Food Insecurity: No Food Insecurity (07/16/2023)   Hunger Vital Sign    Worried About Running Out of Food in the Last Year: Never true    Ran Out of Food in the Last Year: Never true  Transportation Needs: No Transportation Needs (07/16/2023)   PRAPARE - Administrator, Civil Service (Medical): No  Lack of Transportation (Non-Medical): No  Physical Activity: Sufficiently Active (07/16/2023)   Exercise Vital Sign    Days of Exercise per Week: 5 days    Minutes of Exercise per Session: 30 min  Stress: Not on file (07/29/2023)  Recent Concern: Stress - Stress Concern Present (07/16/2023)   [employer] of Occupational Health - Occupational Stress Questionnaire    Feeling of Stress : To some extent  Social Connections: Moderately Isolated (07/16/2023)   Social Connection and Isolation Panel [NHANES]    Frequency of Communication with Friends and Family: More than three times a week    Frequency of Social Gatherings with Friends and Family: Never    Attends Religious Services: More than 4 times per year    Active Member of [employer] or Organizations: No    Attends Banker Meetings: Never    Marital Status: Never married  Intimate Partner Violence: Not At Risk (07/16/2023)   Humiliation, Afraid, Rape, and Kick questionnaire    Fear of  Current or Ex-Partner: No    Emotionally Abused: No    Physically Abused: No    Sexually Abused: No    Review of Systems  Cardiovascular:  Positive for palpitations.  All other systems reviewed and are negative.       Objective    BP (!) 142/94 (BP Location: Left Arm, Patient Position: Sitting, Cuff Size: Large)   Pulse 82   Wt 256 lb (116.1 kg)   SpO2 97%   BMI 41.32 kg/m   Physical Exam Vitals and nursing note reviewed.  Constitutional:      General: She is not in acute distress. Cardiovascular:     Rate and Rhythm: Normal rate and regular rhythm.  Pulmonary:     Effort: Pulmonary effort is normal.     Breath sounds: Normal breath sounds.  Abdominal:     Palpations: Abdomen is soft.     Tenderness: There is no abdominal tenderness.  Neurological:     General: No focal deficit present.     Mental Status: She is alert and oriented to person, place, and time.         Assessment & Plan:  1. Essential hypertension (Primary) Will prescribed losartan 50 mg daily  2. Palpitations  - LONG TERM MONITOR (3-14 DAYS); Future  3. Encounter for completion of form with patient Form completed.    4. Class 3 severe obesity due to excess calories with serious comorbidity and body mass index (BMI) of 40.0 to 44.9 in adult [facility])    Return in about 4 weeks (around 01/26/2024) for follow up.   Tommie Raymond, MD

## 2024-01-06 ENCOUNTER — Telehealth: Payer: Self-pay | Admitting: Urology

## 2024-01-06 NOTE — Telephone Encounter (Signed)
 Pt called saying she saw nail lab results on my chart but doesn't understand what it means, pt would like a call back going over the results. Thanks

## 2024-01-10 ENCOUNTER — Ambulatory Visit: Payer: Self-pay

## 2024-01-10 NOTE — Telephone Encounter (Signed)
   Chief Complaint: toe nail fungus Symptoms: toe nail fungus Frequency: years Disposition: [] ED /[] Urgent Care (no appt availability in office) / [] Appointment(In office/virtual)/ []  [place] [facility]/ [] Home Care/ [] Refused Recommended Disposition /[]  Mobile Bus/ [x]  Follow-up with PCP Additional Notes: pt states that she was seen by podiatry for her toenails and her labs have come back and she has a fungus on her toenails. States that she would like medication called in instead of having to go back to see podiatry. Advised I would get message over.   Copied from CRM [id number]. Topic: Clinical - Medical Advice >> Jan 10, 2024  2:53 PM Tiffany B wrote: Reason for CRM: Patient was seen at [facility] and her toe nail came back positive for fungus, caller states they will not prescribe medication until patient comes back for a follow up. Caller would like PCP to prescribe her a medication.   Also patient states PCP ordered her a heart monitor and has not heard from the specialist regarding who is suppose to be monitoring. Reason for Disposition  Nursing judgment or information in reference  Answer Assessment - Initial Assessment Questions 1. REASON FOR CALL: "What is your main concern right now?"     Toe nail fungus 2. ONSET: "When did the fungus start?"     Years  Protocols used: No Guideline Available-A-AH

## 2024-01-11 ENCOUNTER — Encounter: Payer: Self-pay | Admitting: *Deleted

## 2024-01-14 ENCOUNTER — Ambulatory Visit

## 2024-01-17 ENCOUNTER — Other Ambulatory Visit (HOSPITAL_COMMUNITY)
Admission: RE | Admit: 2024-01-17 | Discharge: 2024-01-17 | Disposition: A | Discharge status: Home / Self Care | Source: Ambulatory Visit | Attending: Obstetrics & Gynecology | Admitting: Obstetrics & Gynecology

## 2024-01-17 ENCOUNTER — Ambulatory Visit (INDEPENDENT_AMBULATORY_CARE_PROVIDER_SITE_OTHER)

## 2024-01-17 VITALS — BP 149/99 | HR 86

## 2024-01-17 DIAGNOSIS — Z113 Encounter for screening for infections with a predominantly sexual mode of transmission: Secondary | ICD-10-CM | POA: Diagnosis not present

## 2024-01-17 DIAGNOSIS — Z01419 Encounter for gynecological examination (general) (routine) without abnormal findings: Secondary | ICD-10-CM | POA: Insufficient documentation

## 2024-01-17 NOTE — Progress Notes (Signed)
 SUBJECTIVE:  38 y.o. female who desires a STI screen. Denies abnormal vaginal discharge, bleeding or significant pelvic pain. No UTI symptoms. Denies history of known exposure to STD.  Patient's last menstrual period was 01/13/2024 (exact date).  OBJECTIVE:  She appears well.   ASSESSMENT:  STI Screen   PLAN:  Pt offered STI blood screening-requested GC, chlamydia, and trichomonas probe sent to lab.  Treatment: To be determined once lab results are received.  Pt follow up as needed.

## 2024-01-18 LAB — HIV ANTIBODY (ROUTINE TESTING W REFLEX): HIV Screen 4th Generation wRfx: NONREACTIVE

## 2024-01-18 LAB — HEPATITIS B SURFACE ANTIGEN: Hepatitis B Surface Ag: NEGATIVE

## 2024-01-18 LAB — CERVICOVAGINAL ANCILLARY ONLY
Bacterial Vaginitis (gardnerella): POSITIVE — AB
Candida Glabrata: NEGATIVE
Candida Vaginitis: NEGATIVE
Chlamydia: NEGATIVE
Comment: NEGATIVE
Comment: NEGATIVE
Comment: NEGATIVE
Comment: NEGATIVE
Comment: NEGATIVE
Comment: NORMAL
Neisseria Gonorrhea: NEGATIVE
Trichomonas: NEGATIVE

## 2024-01-18 LAB — HEPATITIS C ANTIBODY: Hep C Virus Ab: NONREACTIVE

## 2024-01-18 LAB — RPR: RPR Ser Ql: NONREACTIVE

## 2024-01-19 ENCOUNTER — Telehealth: Payer: Self-pay | Admitting: Family Medicine

## 2024-01-19 ENCOUNTER — Other Ambulatory Visit: Payer: Self-pay

## 2024-01-19 DIAGNOSIS — B3731 Acute candidiasis of vulva and vagina: Secondary | ICD-10-CM

## 2024-01-19 DIAGNOSIS — N760 Acute vaginitis: Secondary | ICD-10-CM

## 2024-01-19 MED ORDER — FLUCONAZOLE 150 MG PO TABS: 150.0000 mg | ORAL_TABLET | Freq: Once | ORAL | 0 refills | Status: AC

## 2024-01-19 MED ORDER — METRONIDAZOLE 500 MG PO TABS: 500.0000 mg | ORAL_TABLET | Freq: Two times a day (BID) | ORAL | 0 refills | Status: AC

## 2024-01-19 NOTE — Telephone Encounter (Signed)
 Copied from CRM [id number]. Topic: Appointments - Appointment Cancel/Reschedule >> Jan 19, 2024 11:21 AM Tiffany B wrote: Patient wanted to inform PCP her heart monitor got wet and she threw it away. She will be seeing PCP on  5/5 for weight management instead of her heart monitor.

## 2024-01-19 NOTE — Progress Notes (Signed)
 Pt called in requesting treatment for BV and yeast rx following antibiotics, after +results on 4/28. Sent per protocol

## 2024-01-24 ENCOUNTER — Ambulatory Visit: Admitting: Family Medicine

## 2024-01-24 ENCOUNTER — Encounter: Payer: Self-pay | Admitting: Family Medicine

## 2024-01-24 VITALS — BP 141/100 | HR 74 | Wt 261.4 lb

## 2024-01-24 DIAGNOSIS — Z6841 Body Mass Index (BMI) 40.0 and over, adult: Secondary | ICD-10-CM | POA: Diagnosis not present

## 2024-01-24 DIAGNOSIS — E66813 Obesity, class 3: Secondary | ICD-10-CM

## 2024-01-24 MED ORDER — LOSARTAN POTASSIUM-HCTZ 100-25 MG PO TABS: 1.0000 | ORAL_TABLET | Freq: Every day | ORAL | 0 refills | Status: DC

## 2024-01-25 ENCOUNTER — Ambulatory Visit: Admitting: Podiatry

## 2024-01-25 ENCOUNTER — Encounter: Payer: Self-pay | Admitting: Family Medicine

## 2024-01-25 NOTE — Progress Notes (Signed)
 Established Patient Office Visit  Subjective    Patient ID: Marisa Yu, female    DOB: [birth year]  Age: 38 y.o. MRN: [id number]  CC:  Chief Complaint  Patient presents with   Weight Management Screening    HPI Marisa Yu presents for follow up of hypertension. Patient reports med compliance.   Outpatient Encounter Medications as of 01/24/2024  Medication Sig   losartan  (COZAAR ) 50 MG tablet Take 1 tablet (50 mg total) by mouth daily.   losartan -hydrochlorothiazide (HYZAAR) 100-25 MG tablet Take 1 tablet by mouth daily.   metroNIDAZOLE  (FLAGYL ) 500 MG tablet Take 1 tablet (500 mg total) by mouth 2 (two) times daily for 7 days. (Patient not taking: Reported on 01/24/2024)   No facility-administered encounter medications on file as of 01/24/2024.    Past Medical History:  Diagnosis Date   Anemia    Anxiety    Boil, arm    Family history of BRCA1 gene positive 01/18/2018   Family history of breast cancer    GERD (gastroesophageal reflux disease)    Headache    Hypertension    Hypothyroidism    Tooth ache    Vaginal Pap smear, abnormal     Past Surgical History:  Procedure Laterality Date   CESAREAN SECTION  2004   CESAREAN SECTION  2009   CESAREAN SECTION WITH BILATERAL TUBAL LIGATION Bilateral 08/21/2015   Procedure: CESAREAN SECTION WITH BILATERAL TUBAL LIGATION;  Surgeon: Gabrielle Joiner, MD;  Location: [facility];  Service: Obstetrics;  Laterality: Bilateral;   CYST EXCISION     WISDOM TOOTH EXTRACTION      Family History  Problem Relation Age of Onset   Cancer Mother    Breast cancer Maternal Aunt        dx 58's   Breast cancer Maternal Aunt        dx 40's   Breast cancer Maternal Grandmother    Heart disease Maternal Grandfather    Breast cancer Cousin 62       BRCA1 +   Breast cancer Cousin 61   Other Cousin        BRCA1 +   Breast cancer Cousin 36   Breast cancer Cousin 13   Asthma Son     Social History   Socioeconomic History    Marital status: Single    Spouse name: Not on file   Number of children: 2   Years of education: Not on file   Highest education level: Not on file  Occupational History   Occupation: Childcare    Employer: [id number]  Tobacco Use   Smoking status: Former    Current packs/day: 0.00    Types: Cigarettes    Quit date: 09/22/2011    Years since quitting: 12.3   Smokeless tobacco: Never  Vaping Use   Vaping status: Never Used  Substance and Sexual Activity   Alcohol use: Yes    Comment: occ   Drug use: No   Sexual activity: Not Currently    Partners: Male    Birth control/protection: None  Other Topics Concern   Not on file  Social History Narrative   Not on file   Social Drivers of Health   Financial Resource Strain: Medium Risk (07/16/2023)   Overall Financial Resource Strain (CARDIA)    Difficulty of Paying Living Expenses: Somewhat hard  Food Insecurity: No Food Insecurity (07/16/2023)   Hunger Vital Sign    Worried About Running Out of Food in the Last  Year: Never true    Ran Out of Food in the Last Year: Never true  Transportation Needs: No Transportation Needs (07/16/2023)   PRAPARE - Administrator, Civil Service (Medical): No    Lack of Transportation (Non-Medical): No  Physical Activity: Sufficiently Active (07/16/2023)   Exercise Vital Sign    Days of Exercise per Week: 5 days    Minutes of Exercise per Session: 30 min  Stress: Not on file (07/29/2023)  Recent Concern: Stress - Stress Concern Present (07/16/2023)   [employer] of Occupational Health - Occupational Stress Questionnaire    Feeling of Stress : To some extent  Social Connections: Moderately Isolated (07/16/2023)   Social Connection and Isolation Panel [NHANES]    Frequency of Communication with Friends and Family: More than three times a week    Frequency of Social Gatherings with Friends and Family: Never    Attends Religious Services: More than 4 times per year    Active  Member of [employer] or Organizations: No    Attends Banker Meetings: Never    Marital Status: Never married  Intimate Partner Violence: Not At Risk (07/16/2023)   Humiliation, Afraid, Rape, and Kick questionnaire    Fear of Current or Ex-Partner: No    Emotionally Abused: No    Physically Abused: No    Sexually Abused: No    Review of Systems  All other systems reviewed and are negative.       Objective    BP (!) 141/100 (BP Location: Left Arm, Patient Position: Sitting, Cuff Size: Large)   Pulse 74   Wt 261 lb 7.2 oz (118.6 kg)   LMP 01/13/2024 (Exact Date)   SpO2 98%   BMI 42.20 kg/m   Physical Exam Vitals and nursing note reviewed.  Constitutional:      General: She is not in acute distress. Cardiovascular:     Rate and Rhythm: Normal rate and regular rhythm.  Pulmonary:     Effort: Pulmonary effort is normal.     Breath sounds: Normal breath sounds.  Abdominal:     Palpations: Abdomen is soft.     Tenderness: There is no abdominal tenderness.  Neurological:     General: No focal deficit present.     Mental Status: She is alert and oriented to person, place, and time.         Assessment & Plan:   Class 3 severe obesity due to excess calories with serious comorbidity and body mass index (BMI) of 40.0 to 44.9 in adult  Other orders -     Losartan  Potassium-HCTZ; Take 1 tablet by mouth daily.  Dispense: 90 tablet; Refill: 0     Return in about 2 weeks (around 02/07/2024).   Arlo Lama, MD

## 2024-01-28 ENCOUNTER — Other Ambulatory Visit: Payer: Self-pay

## 2024-01-31 ENCOUNTER — Encounter (HOSPITAL_COMMUNITY): Payer: Self-pay

## 2024-02-07 ENCOUNTER — Ambulatory Visit (INDEPENDENT_AMBULATORY_CARE_PROVIDER_SITE_OTHER): Admitting: Family Medicine

## 2024-02-07 VITALS — BP 130/86 | HR 88 | Temp 98.0°F | Resp 12 | Ht 67.0 in | Wt 261.2 lb

## 2024-02-07 DIAGNOSIS — E6609 Other obesity due to excess calories: Secondary | ICD-10-CM | POA: Diagnosis not present

## 2024-02-07 DIAGNOSIS — E66813 Obesity, class 3: Secondary | ICD-10-CM

## 2024-02-07 DIAGNOSIS — I10 Essential (primary) hypertension: Secondary | ICD-10-CM | POA: Diagnosis not present

## 2024-02-07 DIAGNOSIS — Z6841 Body Mass Index (BMI) 40.0 and over, adult: Secondary | ICD-10-CM

## 2024-02-07 DIAGNOSIS — Z7689 Persons encountering health services in other specified circumstances: Secondary | ICD-10-CM

## 2024-02-07 MED ORDER — PHENTERMINE HCL 37.5 MG PO TABS
37.5000 mg | ORAL_TABLET | Freq: Every day | ORAL | 0 refills | Status: DC
Start: 2024-02-07 — End: 2024-03-13

## 2024-02-07 NOTE — Progress Notes (Signed)
 Established Patient Office Visit  Subjective    Patient ID: Marisa Yu, female    DOB: 20-May-1986  Age: 38 y.o. MRN: [id number]  CC:  Chief Complaint  Patient presents with   Weight Check    HPI Marisa Yu presents for routine weight management as well as hypertension follow up. Patient reports med compliance and denies acute complaints.   Outpatient Encounter Medications as of 02/07/2024  Medication Sig   losartan -hydrochlorothiazide (HYZAAR) 100-25 MG tablet Take 1 tablet by mouth daily.   phentermine  (ADIPEX-P ) 37.5 MG tablet Take 1 tablet (37.5 mg total) by mouth daily before breakfast.   losartan  (COZAAR ) 50 MG tablet Take 1 tablet (50 mg total) by mouth daily. (Patient not taking: Reported on 02/07/2024)   No facility-administered encounter medications on file as of 02/07/2024.    Past Medical History:  Diagnosis Date   Anemia    Anxiety    Boil, arm    Family history of BRCA1 gene positive 01/18/2018   Family history of breast cancer    GERD (gastroesophageal reflux disease)    Headache    Hypertension    Hypothyroidism    Tooth ache    Vaginal Pap smear, abnormal     Past Surgical History:  Procedure Laterality Date   CESAREAN SECTION  2004   CESAREAN SECTION  2009   CESAREAN SECTION WITH BILATERAL TUBAL LIGATION Bilateral 08/21/2015   Procedure: CESAREAN SECTION WITH BILATERAL TUBAL LIGATION;  Surgeon: Gabrielle Joiner, MD;  Location: [facility];  Service: Obstetrics;  Laterality: Bilateral;   CYST EXCISION     WISDOM TOOTH EXTRACTION      Family History  Problem Relation Age of Onset   Cancer Mother    Breast cancer Maternal Aunt        dx 26's   Breast cancer Maternal Aunt        dx 40's   Breast cancer Maternal Grandmother    Heart disease Maternal Grandfather    Breast cancer Cousin 38       BRCA1 +   Breast cancer Cousin 48   Other Cousin        BRCA1 +   Breast cancer Cousin 36   Breast cancer Cousin 27   Asthma Son      Social History   Socioeconomic History   Marital status: Single    Spouse name: Not on file   Number of children: 2   Years of education: Not on file   Highest education level: Not on file  Occupational History   Occupation: Childcare    Employer: [id number]  Tobacco Use   Smoking status: Former    Current packs/day: 0.00    Types: Cigarettes    Quit date: 09/22/2011    Years since quitting: 12.3   Smokeless tobacco: Never  Vaping Use   Vaping status: Never Used  Substance and Sexual Activity   Alcohol use: Yes    Comment: occ   Drug use: No   Sexual activity: Not Currently    Partners: Male    Birth control/protection: None  Other Topics Concern   Not on file  Social History Narrative   Not on file   Social Drivers of Health   Financial Resource Strain: Medium Risk (07/16/2023)   Overall Financial Resource Strain (CARDIA)    Difficulty of Paying Living Expenses: Somewhat hard  Food Insecurity: No Food Insecurity (07/16/2023)   Hunger Vital Sign    Worried About Running Out of  Food in the Last Year: Never true    Ran Out of Food in the Last Year: Never true  Transportation Needs: No Transportation Needs (07/16/2023)   PRAPARE - Administrator, Civil Service (Medical): No    Lack of Transportation (Non-Medical): No  Physical Activity: Sufficiently Active (07/16/2023)   Exercise Vital Sign    Days of Exercise per Week: 5 days    Minutes of Exercise per Session: 30 min  Stress: Not on file (07/29/2023)  Recent Concern: Stress - Stress Concern Present (07/16/2023)   [employer] of Occupational Health - Occupational Stress Questionnaire    Feeling of Stress : To some extent  Social Connections: Moderately Isolated (07/16/2023)   Social Connection and Isolation Panel [NHANES]    Frequency of Communication with Friends and Family: More than three times a week    Frequency of Social Gatherings with Friends and Family: Never    Attends Religious  Services: More than 4 times per year    Active Member of [employer] or Organizations: No    Attends Banker Meetings: Never    Marital Status: Never married  Intimate Partner Violence: Not At Risk (07/16/2023)   Humiliation, Afraid, Rape, and Kick questionnaire    Fear of Current or Ex-Partner: No    Emotionally Abused: No    Physically Abused: No    Sexually Abused: No    Review of Systems  All other systems reviewed and are negative.       Objective    BP 130/86 (BP Location: Right Arm, Patient Position: Standing;Sitting)   Pulse 88   Temp 98 F (36.7 C) (Oral)   Resp 12   Ht 5\' 7"  (1.702 m)   Wt 261 lb 3.2 oz (118.5 kg)   LMP 01/13/2024 (Exact Date)   SpO2 99%   BMI 40.91 kg/m   Physical Exam Vitals and nursing note reviewed.  Constitutional:      General: She is not in acute distress.    Appearance: She is obese.  Cardiovascular:     Rate and Rhythm: Normal rate and regular rhythm.  Pulmonary:     Effort: Pulmonary effort is normal.     Breath sounds: Normal breath sounds.  Abdominal:     Palpations: Abdomen is soft.     Tenderness: There is no abdominal tenderness.  Neurological:     General: No focal deficit present.     Mental Status: She is alert and oriented to person, place, and time.         Assessment & Plan:   Encounter for weight management  Class 3 severe obesity due to excess calories with serious comorbidity and body mass index (BMI) of 40.0 to 44.9 in adult  Essential hypertension  Other orders -     Phentermine  HCl; Take 1 tablet (37.5 mg total) by mouth daily before breakfast.  Dispense: 30 tablet; Refill: 0     No follow-ups on file.   Arlo Lama, MD

## 2024-02-07 NOTE — Progress Notes (Signed)
Pt is here for weight and BP check.

## 2024-02-08 ENCOUNTER — Encounter: Payer: Self-pay | Admitting: Family Medicine

## 2024-02-08 ENCOUNTER — Ambulatory Visit: Admitting: Podiatry

## 2024-02-08 ENCOUNTER — Encounter: Payer: Self-pay | Admitting: Podiatry

## 2024-02-08 DIAGNOSIS — L603 Nail dystrophy: Secondary | ICD-10-CM

## 2024-02-08 NOTE — Progress Notes (Signed)
 She presents today for follow-up of her nail pathology.  Objective: Vital signs are stable she is alert and oriented x 3 no change in physical exam pathology does demonstrate saprophytic fungus with onycholysis.  Assessment: Onychomycosis.  Plan: We discussed topical therapy oral therapy and laser therapy.  At this time she was given all of the information and the possible risks and side effects related to the oral medications.  She states that she would like to think about this she may just do nothing but keep the nail cut short and follow-up then and keep it painted.  I did express to her that if she started to develop any cutaneous rashes on the foot she would notify us  immediately.

## 2024-02-09 ENCOUNTER — Telehealth: Payer: Self-pay

## 2024-02-09 ENCOUNTER — Encounter: Payer: Self-pay | Admitting: Family Medicine

## 2024-02-09 NOTE — Telephone Encounter (Signed)
 Called to reschedule OPU on 6/20.[name] pt states she will hold off on picking them up due to financial issues.

## 2024-02-10 ENCOUNTER — Other Ambulatory Visit: Payer: Self-pay | Admitting: Family Medicine

## 2024-02-10 MED ORDER — CEPHALEXIN 500 MG PO CAPS: 500.0000 mg | ORAL_CAPSULE | Freq: Three times a day (TID) | ORAL | 0 refills | Status: DC

## 2024-02-28 ENCOUNTER — Other Ambulatory Visit: Payer: Self-pay

## 2024-02-28 MED ORDER — METRONIDAZOLE 500 MG PO TABS: 500.0000 mg | ORAL_TABLET | Freq: Two times a day (BID) | ORAL | 0 refills | Status: DC

## 2024-03-10 ENCOUNTER — Other Ambulatory Visit: Payer: Self-pay

## 2024-03-13 ENCOUNTER — Ambulatory Visit (INDEPENDENT_AMBULATORY_CARE_PROVIDER_SITE_OTHER): Admitting: Family Medicine

## 2024-03-13 ENCOUNTER — Encounter: Payer: Self-pay | Admitting: Family Medicine

## 2024-03-13 VITALS — BP 145/101 | HR 77 | Wt 260.0 lb

## 2024-03-13 DIAGNOSIS — Z6841 Body Mass Index (BMI) 40.0 and over, adult: Secondary | ICD-10-CM

## 2024-03-13 DIAGNOSIS — E66813 Obesity, class 3: Secondary | ICD-10-CM

## 2024-03-13 DIAGNOSIS — Z7689 Persons encountering health services in other specified circumstances: Secondary | ICD-10-CM

## 2024-03-13 MED ORDER — PHENTERMINE HCL 37.5 MG PO TABS: 37.5000 mg | ORAL_TABLET | Freq: Every day | ORAL | 0 refills | Status: DC

## 2024-03-13 NOTE — Progress Notes (Unsigned)
 Established Patient Office Visit  Subjective    Patient ID: Marisa Yu, female    DOB: [birth year]  Age: 38 y.o. MRN: [id number]  CC:  Chief Complaint  Patient presents with   Weight Management Screening    HPI Marisa Yu presents for routine weight management. Patient reports that she has been walking. Her diet has been suspect. She has not been eating regularly and not doing an appropriate diet.   Outpatient Encounter Medications as of 03/13/2024  Medication Sig   losartan -hydrochlorothiazide (HYZAAR) 100-25 MG tablet Take 1 tablet by mouth daily.   metroNIDAZOLE  (FLAGYL ) 500 MG tablet Take 1 tablet (500 mg total) by mouth 2 (two) times daily.   phentermine  (ADIPEX-P ) 37.5 MG tablet Take 1 tablet (37.5 mg total) by mouth daily before breakfast.   cephALEXin  (KEFLEX ) 500 MG capsule Take 1 capsule (500 mg total) by mouth 3 (three) times daily.   losartan  (COZAAR ) 50 MG tablet Take 1 tablet (50 mg total) by mouth daily. (Patient not taking: Reported on 02/07/2024)   No facility-administered encounter medications on file as of 03/13/2024.    Past Medical History:  Diagnosis Date   Anemia    Anxiety    Boil, arm    Family history of BRCA1 gene positive 01/18/2018   Family history of breast cancer    GERD (gastroesophageal reflux disease)    Headache    Hypertension    Hypothyroidism    Tooth ache    Vaginal Pap smear, abnormal     Past Surgical History:  Procedure Laterality Date   CESAREAN SECTION  2004   CESAREAN SECTION  2009   CESAREAN SECTION WITH BILATERAL TUBAL LIGATION Bilateral 08/21/2015   Procedure: CESAREAN SECTION WITH BILATERAL TUBAL LIGATION;  Surgeon: Carlin DELENA Centers, MD;  Location: [facility];  Service: Obstetrics;  Laterality: Bilateral;   CYST EXCISION     WISDOM TOOTH EXTRACTION      Family History  Problem Relation Age of Onset   Cancer Mother    Breast cancer Maternal Aunt        dx 65's   Breast cancer Maternal Aunt         dx 40's   Breast cancer Maternal Grandmother    Heart disease Maternal Grandfather    Breast cancer Cousin 76       BRCA1 +   Breast cancer Cousin 33   Other Cousin        BRCA1 +   Breast cancer Cousin 36   Breast cancer Cousin 40   Asthma Son     Social History   Socioeconomic History   Marital status: Single    Spouse name: Not on file   Number of children: 2   Years of education: Not on file   Highest education level: Not on file  Occupational History   Occupation: Childcare    Employer: [id number]  Tobacco Use   Smoking status: Former    Current packs/day: 0.00    Types: Cigarettes    Quit date: 09/22/2011    Years since quitting: 12.4   Smokeless tobacco: Never  Vaping Use   Vaping status: Never Used  Substance and Sexual Activity   Alcohol use: Yes    Comment: occ   Drug use: No   Sexual activity: Not Currently    Partners: Male    Birth control/protection: None  Other Topics Concern   Not on file  Social History Narrative   Not on file  Social Drivers of Health   Financial Resource Strain: Medium Risk (07/16/2023)   Overall Financial Resource Strain (CARDIA)    Difficulty of Paying Living Expenses: Somewhat hard  Food Insecurity: No Food Insecurity (07/16/2023)   Hunger Vital Sign    Worried About Running Out of Food in the Last Year: Never true    Ran Out of Food in the Last Year: Never true  Transportation Needs: No Transportation Needs (07/16/2023)   PRAPARE - Administrator, Civil Service (Medical): No    Lack of Transportation (Non-Medical): No  Physical Activity: Sufficiently Active (07/16/2023)   Exercise Vital Sign    Days of Exercise per Week: 5 days    Minutes of Exercise per Session: 30 min  Stress: Not on file (07/29/2023)  Recent Concern: Stress - Stress Concern Present (07/16/2023)   [employer] of Occupational Health - Occupational Stress Questionnaire    Feeling of Stress : To some extent  Social Connections:  Moderately Isolated (07/16/2023)   Social Connection and Isolation Panel    Frequency of Communication with Friends and Family: More than three times a week    Frequency of Social Gatherings with Friends and Family: Never    Attends Religious Services: More than 4 times per year    Active Member of [employer] or Organizations: No    Attends Banker Meetings: Never    Marital Status: Never married  Intimate Partner Violence: Not At Risk (07/16/2023)   Humiliation, Afraid, Rape, and Kick questionnaire    Fear of Current or Ex-Partner: No    Emotionally Abused: No    Physically Abused: No    Sexually Abused: No    Review of Systems  All other systems reviewed and are negative.       Objective    BP (!) 145/101 (Cuff Size: Large)   Pulse 77   Wt 260 lb (117.9 kg)   LMP 03/09/2024   SpO2 95%   BMI 40.72 kg/m   Physical Exam Vitals and nursing note reviewed.  Constitutional:      General: She is not in acute distress.    Appearance: She is obese.   Cardiovascular:     Rate and Rhythm: Normal rate and regular rhythm.  Pulmonary:     Effort: Pulmonary effort is normal.     Breath sounds: Normal breath sounds.  Abdominal:     Palpations: Abdomen is soft.     Tenderness: There is no abdominal tenderness.   Neurological:     General: No focal deficit present.     Mental Status: She is alert and oriented to person, place, and time.     {Labs (Optional):23779}    Assessment & Plan:   1. Encounter for weight management (Primary) Discussed improving diet. Will refill phentermine   - goal weight loss 3 lbs or will have med hiatus.   2. Class 3 severe obesity due to excess calories with serious comorbidity and body mass index (BMI) of 40.0 to 44.9 in adult   No follow-ups on file.   Tanda Raguel SQUIBB, MD

## 2024-03-14 ENCOUNTER — Encounter: Payer: Self-pay | Admitting: Family Medicine

## 2024-04-10 ENCOUNTER — Ambulatory Visit: Admitting: Family Medicine

## 2024-05-29 ENCOUNTER — Ambulatory Visit: Payer: Self-pay

## 2024-06-23 ENCOUNTER — Other Ambulatory Visit: Payer: Self-pay | Admitting: Family Medicine

## 2024-07-16 ENCOUNTER — Observation Stay (HOSPITAL_BASED_OUTPATIENT_CLINIC_OR_DEPARTMENT_OTHER)
Admission: EM | Admit: 2024-07-16 | Discharge: 2024-07-18 | Disposition: A | Discharge status: Home / Self Care | Payer: Self-pay | Attending: Surgery | Admitting: Surgery

## 2024-07-16 ENCOUNTER — Encounter (HOSPITAL_BASED_OUTPATIENT_CLINIC_OR_DEPARTMENT_OTHER): Payer: Self-pay | Admitting: Emergency Medicine

## 2024-07-16 ENCOUNTER — Other Ambulatory Visit: Payer: Self-pay

## 2024-07-16 ENCOUNTER — Emergency Department (HOSPITAL_BASED_OUTPATIENT_CLINIC_OR_DEPARTMENT_OTHER): Payer: Self-pay

## 2024-07-16 DIAGNOSIS — R112 Nausea with vomiting, unspecified: Secondary | ICD-10-CM

## 2024-07-16 DIAGNOSIS — Z87891 Personal history of nicotine dependence: Secondary | ICD-10-CM | POA: Insufficient documentation

## 2024-07-16 DIAGNOSIS — K810 Acute cholecystitis: Secondary | ICD-10-CM

## 2024-07-16 DIAGNOSIS — K8012 Calculus of gallbladder with acute and chronic cholecystitis without obstruction: Principal | ICD-10-CM | POA: Insufficient documentation

## 2024-07-16 DIAGNOSIS — K8050 Calculus of bile duct without cholangitis or cholecystitis without obstruction: Secondary | ICD-10-CM

## 2024-07-16 DIAGNOSIS — K8020 Calculus of gallbladder without cholecystitis without obstruction: Secondary | ICD-10-CM | POA: Diagnosis present

## 2024-07-16 DIAGNOSIS — Z79899 Other long term (current) drug therapy: Secondary | ICD-10-CM | POA: Insufficient documentation

## 2024-07-16 DIAGNOSIS — E039 Hypothyroidism, unspecified: Secondary | ICD-10-CM | POA: Insufficient documentation

## 2024-07-16 DIAGNOSIS — R1010 Upper abdominal pain, unspecified: Principal | ICD-10-CM

## 2024-07-16 DIAGNOSIS — I10 Essential (primary) hypertension: Secondary | ICD-10-CM | POA: Insufficient documentation

## 2024-07-16 LAB — URINE DRUG SCREEN
Amphetamines: NEGATIVE
Barbiturates: NEGATIVE
Benzodiazepines: NEGATIVE
Cocaine: NEGATIVE
Fentanyl: NEGATIVE
Methadone Scn, Ur: NEGATIVE
Opiates: NEGATIVE
Tetrahydrocannabinol: NEGATIVE

## 2024-07-16 LAB — COMPREHENSIVE METABOLIC PANEL WITH GFR
ALT: 27 U/L (ref 0–44)
AST: 26 U/L (ref 15–41)
Albumin: 4.3 g/dL (ref 3.5–5.0)
Alkaline Phosphatase: 76 U/L (ref 38–126)
Anion gap: 11 (ref 5–15)
BUN: 8 mg/dL (ref 6–20)
CO2: 23 mmol/L (ref 22–32)
Calcium: 9.2 mg/dL (ref 8.9–10.3)
Chloride: 103 mmol/L (ref 98–111)
Creatinine, Ser: 0.74 mg/dL (ref 0.44–1.00)
GFR, Estimated: >60 mL/min (ref >60)
Glucose, Bld: 128 mg/dL — ABNORMAL HIGH (ref 70–99)
Potassium: 3.6 mmol/L (ref 3.5–5.1)
Sodium: 137 mmol/L (ref 135–145)
Total Bilirubin: 0.5 mg/dL (ref 0.0–1.2)
Total Protein: 7.8 g/dL (ref 6.5–8.1)

## 2024-07-16 LAB — CBC
HCT: 41.1 % (ref 36.0–46.0)
Hemoglobin: 13 g/dL (ref 12.0–15.0)
MCH: 26.9 pg (ref 26.0–34.0)
MCHC: 31.6 g/dL (ref 30.0–36.0)
MCV: 84.9 fL (ref 80.0–100.0)
Platelets: 312 K/uL (ref 150–400)
RBC: 4.84 MIL/uL (ref 3.87–5.11)
RDW: 14 % (ref 11.5–15.5)
WBC: 4.6 K/uL (ref 4.0–10.5)
nRBC: 0 % (ref 0.0–0.2)

## 2024-07-16 LAB — URINALYSIS, ROUTINE W REFLEX MICROSCOPIC
Bilirubin Urine: NEGATIVE
Glucose, UA: NEGATIVE mg/dL
Ketones, ur: NEGATIVE mg/dL
Leukocytes,Ua: NEGATIVE
Nitrite: NEGATIVE
Protein, ur: NEGATIVE mg/dL
Specific Gravity, Urine: 1.02 (ref 1.005–1.030)
pH: 5 (ref 5.0–8.0)

## 2024-07-16 LAB — URINALYSIS, MICROSCOPIC (REFLEX): Bacteria, UA: NONE SEEN

## 2024-07-16 LAB — LIPASE, BLOOD: Lipase: 22 U/L (ref 11–51)

## 2024-07-16 LAB — HCG, SERUM, QUALITATIVE: Preg, Serum: NEGATIVE

## 2024-07-16 MED ORDER — ONDANSETRON 4 MG PO TBDP: 4.0000 mg | ORAL_TABLET | Freq: Four times a day (QID) | ORAL | Status: DC | PRN

## 2024-07-16 MED ORDER — LACTATED RINGERS IV BOLUS
1000.0000 mL | Freq: Once | INTRAVENOUS | Status: AC
  Administered 2024-07-16: 1000 mL via INTRAVENOUS

## 2024-07-16 MED ORDER — HYDROMORPHONE HCL 1 MG/ML IJ SOLN
1.0000 mg | Freq: Once | INTRAMUSCULAR | Status: AC
  Administered 2024-07-16: 1 mg via INTRAVENOUS
  Filled 2024-07-16: qty 1

## 2024-07-16 MED ORDER — MORPHINE SULFATE (PF) 4 MG/ML IV SOLN
4.0000 mg | Freq: Once | INTRAVENOUS | Status: AC
  Administered 2024-07-16: 4 mg via INTRAVENOUS
  Filled 2024-07-16: qty 1

## 2024-07-16 MED ORDER — PANTOPRAZOLE SODIUM 40 MG IV SOLR
40.0000 mg | Freq: Once | INTRAVENOUS | Status: AC
  Administered 2024-07-16: 40 mg via INTRAVENOUS
  Filled 2024-07-16: qty 10

## 2024-07-16 MED ORDER — HYDROMORPHONE HCL 1 MG/ML IJ SOLN
0.5000 mg | Freq: Once | INTRAMUSCULAR | Status: AC
  Administered 2024-07-16: 0.5 mg via INTRAVENOUS
  Filled 2024-07-16: qty 1

## 2024-07-16 MED ORDER — ONDANSETRON HCL 4 MG/2ML IJ SOLN: 4.0000 mg | Freq: Four times a day (QID) | INTRAMUSCULAR | Status: DC | PRN

## 2024-07-16 MED ORDER — ONDANSETRON HCL 4 MG/2ML IJ SOLN
4.0000 mg | Freq: Once | INTRAMUSCULAR | Status: AC
  Administered 2024-07-16: 4 mg via INTRAVENOUS
  Filled 2024-07-16: qty 2

## 2024-07-16 MED ORDER — SODIUM CHLORIDE 0.9 % IV SOLN: INTRAVENOUS | Status: AC

## 2024-07-16 MED ORDER — PANTOPRAZOLE SODIUM 40 MG IV SOLR
40.0000 mg | Freq: Every day | INTRAVENOUS | Status: DC
  Administered 2024-07-16 – 2024-07-17 (×2): 40 mg via INTRAVENOUS
  Filled 2024-07-16 (×2): qty 10

## 2024-07-16 MED ORDER — FENTANYL CITRATE (PF) 50 MCG/ML IJ SOSY
12.5000 ug | PREFILLED_SYRINGE | INTRAMUSCULAR | Status: DC | PRN
  Administered 2024-07-16 – 2024-07-17 (×3): 12.5 ug via INTRAVENOUS
  Filled 2024-07-16 (×3): qty 1

## 2024-07-16 MED ORDER — CARMEX CLASSIC LIP BALM EX OINT
TOPICAL_OINTMENT | Freq: Once | CUTANEOUS | Status: AC
Start: 2024-07-16 — End: 2024-07-16
  Administered 2024-07-16: 1 via TOPICAL
  Filled 2024-07-16: qty 10

## 2024-07-16 MED ORDER — PIPERACILLIN-TAZOBACTAM 3.375 G IVPB 30 MIN
3.3750 g | Freq: Once | INTRAVENOUS | Status: AC
  Administered 2024-07-16: 3.375 g via INTRAVENOUS
  Filled 2024-07-16: qty 50

## 2024-07-16 MED ORDER — IOHEXOL 300 MG/ML  SOLN
100.0000 mL | Freq: Once | INTRAMUSCULAR | Status: AC | PRN
  Administered 2024-07-16: 100 mL via INTRAVENOUS

## 2024-07-16 NOTE — ED Triage Notes (Signed)
 Mid abd pain started today , emesis , no diarrhea .  Adds feeling short of breath

## 2024-07-16 NOTE — ED Notes (Signed)
 ED TO INPATIENT HANDOFF REPORT  Name/Age/Gender Marisa Yu 38 y.o. female  Code Status    Code Status Orders  (From admission, onward)           Start     Ordered   07/16/24 1803  Full code  Continuous       Question:  By:  Answer:  Consent: discussion documented in EHR   07/16/24 1804           Code Status History     Date Active Date Inactive Code Status Order ID Comments User Context   08/21/2015 1141 08/24/2015 1435 Full Code [id number]  Rudy Carlin LABOR, MD Inpatient       Home/SNF/Other Home  Chief Complaint Gallstones [K80.20]  Level of Care/Admitting Diagnosis ED Disposition     ED Disposition  Admit   Condition  --   Comment  Hospital Area: [facility] [[id number]]  Level of Care: Med-Surg [16]  May place patient in observation at [facility] or [facility] if equivalent level of care is available:: No  Diagnosis: Gallstones [755682]  Admitting Physician: CCS, MD [[id number]]  Attending Physician: CCS, MD [[id number]]  For patients discharging to extended facilities (i.e. SNF, AL, group homes or LTAC) initiate:: Discharge to SNF/Facility Placement COVID-19 Lab Testing Protocol          Medical History Past Medical History:  Diagnosis Date   Anemia    Anxiety    Boil, arm    Family history of BRCA1 gene positive 01/18/2018   Family history of breast cancer    GERD (gastroesophageal reflux disease)    Headache    Hypertension    Hypothyroidism    Tooth ache    Vaginal Pap smear, abnormal     Allergies Allergies  Allergen Reactions   Codeine Itching    IV Location/Drains/Wounds Patient Lines/Drains/Airways Status     Active Line/Drains/Airways     Name Placement date Placement time Site Days   Peripheral IV 07/16/24 20 G Right Antecubital 07/16/24  0802  Antecubital  less than 1            Labs/Imaging Results for orders placed or performed during the hospital encounter of 07/16/24 (from the past 48  hours)  Urinalysis, Routine w reflex microscopic -Urine, Clean Catch     Status: Abnormal   Collection Time: 07/16/24  7:53 AM  Result Value Ref Range   Color, Urine YELLOW YELLOW   APPearance CLEAR CLEAR   Specific Gravity, Urine 1.020 1.005 - 1.030   pH 5.0 5.0 - 8.0   Glucose, UA NEGATIVE NEGATIVE mg/dL   Hgb urine dipstick TRACE (A) NEGATIVE   Bilirubin Urine NEGATIVE NEGATIVE   Ketones, ur NEGATIVE NEGATIVE mg/dL   Protein, ur NEGATIVE NEGATIVE mg/dL   Nitrite NEGATIVE NEGATIVE   Leukocytes,Ua NEGATIVE NEGATIVE    Comment: Performed at [facility], [address] [place], [place] [address]  Urinalysis, Microscopic (reflex)     Status: None   Collection Time: 07/16/24  7:53 AM  Result Value Ref Range   RBC / HPF 0-5 0 - 5 RBC/hpf   WBC, UA 0-5 0 - 5 WBC/hpf   Bacteria, UA NONE SEEN NONE SEEN   Squamous Epithelial / HPF 0-5 0 - 5 /HPF    Comment: Performed at [facility], [address] [place], [place] [address]  Lipase, blood     Status: None   Collection Time: 07/16/24  7:59  AM  Result Value Ref Range   Lipase 22 11 - 51 U/L    Comment: Performed at [facility], [address] [place], [place] [address]  Comprehensive metabolic panel     Status: Abnormal   Collection Time: 07/16/24  7:59 AM  Result Value Ref Range   Sodium 137 135 - 145 mmol/L   Potassium 3.6 3.5 - 5.1 mmol/L   Chloride 103 98 - 111 mmol/L   CO2 23 22 - 32 mmol/L   Glucose, Bld 128 (H) 70 - 99 mg/dL    Comment: Glucose reference range applies only to samples taken after fasting for at least 8 hours.   BUN 8 6 - 20 mg/dL   Creatinine, Ser 9.25 0.44 - 1.00 mg/dL   Calcium 9.2 8.9 - 89.6 mg/dL   Total Protein 7.8 6.5 - 8.1 g/dL   Albumin 4.3 3.5 - 5.0 g/dL   AST 26 15 - 41 U/L   ALT 27 0 - 44 U/L   Alkaline Phosphatase 76 38 - 126 U/L   Total Bilirubin 0.5 0.0 - 1.2 mg/dL   GFR, Estimated >39 >39 mL/min    Comment: (NOTE) Calculated using the CKD-EPI  Creatinine Equation (2021)    Anion gap 11 5 - 15    Comment: Performed at [facility], [address] [place], [place] [address]  CBC     Status: None   Collection Time: 07/16/24  7:59 AM  Result Value Ref Range   WBC 4.6 4.0 - 10.5 K/uL   RBC 4.84 3.87 - 5.11 MIL/uL   Hemoglobin 13.0 12.0 - 15.0 g/dL   HCT 58.8 63.9 - 53.9 %   MCV 84.9 80.0 - 100.0 fL   MCH 26.9 26.0 - 34.0 pg   MCHC 31.6 30.0 - 36.0 g/dL   RDW 85.9 88.4 - 84.4 %   Platelets 312 150 - 400 K/uL   nRBC 0.0 0.0 - 0.2 %    Comment: Performed at [facility], [address] [place], [place] [address]  hCG, serum, qualitative     Status: None   Collection Time: 07/16/24  7:59 AM  Result Value Ref Range   Preg, Serum NEGATIVE NEGATIVE    Comment:        THE SENSITIVITY OF THIS METHODOLOGY IS >10 mIU/mL. Performed at [facility], [address] [place], [place] [address]   Urine Drug Screen     Status: None   Collection Time: 07/16/24 10:37 AM  Result Value Ref Range   Opiates NEGATIVE NEGATIVE   Cocaine NEGATIVE NEGATIVE   Benzodiazepines NEGATIVE NEGATIVE   Amphetamines NEGATIVE NEGATIVE   Tetrahydrocannabinol NEGATIVE NEGATIVE   Barbiturates NEGATIVE NEGATIVE   Methadone Scn, Ur NEGATIVE NEGATIVE   Fentanyl  NEGATIVE NEGATIVE    Comment: (NOTE) Drug screen is for Medical Purposes only. Positive results are preliminary only. If confirmation is needed, notify lab within 5 days.  Drug Class                 Cutoff (ng/mL) Amphetamine and metabolites 1000 Barbiturate and metabolites 200 Benzodiazepine              200 Opiates and metabolites     300 Cocaine and metabolites     300 THC                         50 Fentanyl   5 Methadone                   300  Trazodone is metabolized in vivo to several metabolites,  including pharmacologically active m-CPP, which is excreted in the  urine.  Immunoassay screens for amphetamines and MDMA have potential   cross-reactivity with these compounds and may provide false positive  result.  Performed at [facility], [address] [place], [place] [address]    US  Abdomen Limited RUQ (LIVER/GB) Result Date: 07/16/2024 EXAM: Right Upper Quadrant Abdominal Ultrasound TECHNIQUE: Real-time ultrasonography of the right upper quadrant of the abdomen was performed. COMPARISON: None. CLINICAL HISTORY: Abdominal pain. FINDINGS: LIVER: The liver demonstrates normal echogenicity. No intrahepatic biliary ductal dilatation. No mass. BILIARY SYSTEM: The gallbladder lumen is filled with echogenic gallstones. Gallstones range from 10 to 14 mm. Positive sonographic Murphy sign. No evidence of pericholecystic fluid or wall thickening. Common bile duct is dilated to 9 mm. No choledocholithiasis identified on CT exam. RIGHT KIDNEY: The right kidney is grossly unremarkable in appearances without evidence of hydronephrosis, echogenic calculi or worrisome mass lesions. PANCREAS: Visualized portions of the pancreas are unremarkable. OTHER: No right upper quadrant ascites. IMPRESSION: 1. Ultrasound findings concerning for acute cholecystitis. Recommend clinical correlation 2. Dilatation of the proximal common bile duct. No choledocholithiasis identified on CT exam. Electronically signed by: Norleen Boxer MD 07/16/2024 12:17 PM EDT RP Workstation: [id number]   CT ABDOMEN PELVIS W CONTRAST Result Date: 07/16/2024 EXAM: CT ABDOMEN AND PELVIS WITH CONTRAST 07/16/2024 10:05:00 AM TECHNIQUE: CT of the abdomen and pelvis was performed with the administration of 100 mL of iohexol (OMNIPAQUE) 300 MG/ML solution. Multiplanar reformatted images are provided for review. Automated exposure control, iterative reconstruction, and/or weight-based adjustment of the mA/kV was utilized to reduce the radiation dose to as low as reasonably achievable. COMPARISON: None available. CLINICAL HISTORY: 38 year old female with acute nonlocalized  abdominal pain, mid abdominal pain started today, emesis, and shortness of breath. FINDINGS: LOWER CHEST: Low lung volumes. Mild lung base atelectasis. Heart size upper limits of normal. LIVER: No intrahepatic biliary ductal dilatation. GALLBLADDER AND BILE DUCTS: Extensive cholelithiasis. Individual stones up to 12 mm. The gallbladder appears mildly distended, but no surrounding inflammation by CT. There is a stone identified lodged in the neck of the gallbladder on both series 2 image 18 and coronal image 90. That stone measures 11 mm. There is associated common bile duct (CBD) dilatation 9 mm. SPLEEN: No acute abnormality. PANCREAS: No acute abnormality. ADRENAL GLANDS: No acute abnormality. KIDNEYS, URETERS AND BLADDER: No stones in the kidneys or ureters. No hydronephrosis. No perinephric or periureteral stranding. Urinary bladder is unremarkable. GI AND BOWEL: Stomach demonstrates no acute abnormality. Redundant large bowel. Normal appendix on coronal image 79. There is no bowel obstruction. PERITONEUM AND RETROPERITONEUM: Small volume of free fluid is mildly complex but primarily in the cul de sac and is probably physiologic. No free air. VASCULATURE: Aorta is normal in caliber. LYMPH NODES: No lymphadenopathy. REPRODUCTIVE ORGANS: Uterus and adnexa within normal limits. BONES AND SOFT TISSUES: Pelvic phleboliths. No acute osseous abnormality. No focal soft tissue abnormality. IMPRESSION: 1. Extensive cholithiasis with a gallbladder neck stone (11 mm) and common bile duct dilatation (9 mm). Consider developing acute cholecystitis and/or Mirizzi syndrome. Electronically signed by: Helayne Hurst MD 07/16/2024 10:37 AM EDT RP Workstation: [id number]    Pending Labs Unresulted Labs (From admission, onward)    None       Vitals/Pain Today's Vitals   07/16/24  1157 07/16/24 1245 07/16/24 1601 07/16/24 1610  BP: (!) 149/95  (!) 147/93   Pulse: 67  67   Resp: 20  20   Temp: 98.5 F (36.9 C)  (!) 97.4  F (36.3 C)   TempSrc: Oral  Oral   SpO2: 100%  98%   Weight:      PainSc:  10-Worst pain ever  5     Isolation Precautions No active isolations  Medications Medications  lactated ringers  bolus 1,000 mL (0 mLs Intravenous Stopped 07/16/24 1014)  ondansetron  (ZOFRAN ) injection 4 mg (4 mg Intravenous Given 07/16/24 0820)  pantoprazole (PROTONIX) injection 40 mg (40 mg Intravenous Given 07/16/24 0820)  HYDROmorphone (DILAUDID) injection 0.5 mg (0.5 mg Intravenous Given 07/16/24 0820)  iohexol (OMNIPAQUE) 300 MG/ML solution 100 mL (100 mLs Intravenous Contrast Given 07/16/24 1001)  morphine  (PF) 4 MG/ML injection 4 mg (4 mg Intravenous Given 07/16/24 1101)  morphine  (PF) 4 MG/ML injection 4 mg (4 mg Intravenous Given 07/16/24 1341)  piperacillin-tazobactam (ZOSYN) IVPB 3.375 g (0 g Intravenous Stopped 07/16/24 1416)  HYDROmorphone (DILAUDID) injection 1 mg (1 mg Intravenous Given 07/16/24 1507)  lip balm (CARMEX) ointment (1 Application Topical Given 07/16/24 1610)    Mobility walks

## 2024-07-16 NOTE — ED Notes (Signed)
 Called reports to CN at [facility] ED

## 2024-07-16 NOTE — ED Provider Notes (Signed)
 4:47 PM Assumed care of patient from off-going team. For more details, please see note from same day.  In brief, this is a 38 y.o. female who is transferred from [facility] for evaluation by Dr. Curvin for cholecystitis. Received IV zosyn.   BP (!) 147/93 (BP Location: Left Arm)   Pulse 67   Temp (!) 97.4 F (36.3 C) (Oral)   Resp 20   Wt 120.2 kg   LMP 06/25/2024   SpO2 98%   BMI 41.50 kg/m    ED Course:   Clinical Course as of 07/16/24 1647  Sun Jul 16, 2024  1619 Patient arrived to [facility]. She is complaining only of hunger, notes no abd pain, N/V. Paging Dr. Curvin [HN]    Clinical Course User Index [HN] Franklyn Sid SAILOR, MD    Patient was evaluated by Dr. Curvin.  Per his note, he will plan to admit the patient for IV hydration, pain management, and likely cholecystectomy tomorrow morning.  Dispo: Admit to surgery ------------------------------- Sid Franklyn, MD Emergency Medicine  This note was created using dictation software, which may contain spelling or grammatical errors.   Franklyn Sid SAILOR, MD 07/16/24 2031

## 2024-07-16 NOTE — ED Provider Notes (Signed)
 [place] [facility] Provider Note   CSN: [id number] Arrival date & time: 07/16/24  0745     Patient presents with: Abdominal Pain   Marisa Yu is a 38 y.o. female.   Pt with c/o epigastric pain, dull, moderate, non radiating, without specific exacerbating or alleviating factors. Started last night no hx same pain. No hx pud, pancreatitis, or gallstones. No back/flank pain. No dysuria or gu c/o. Last period 1 month ago. No lower abd or pelvic pain. No vaginal discharge or bleeding. +nausea, vomiting. Yellowish. No chest pain or sob. No cough or uri symptoms. No known bad food ingestion or ill contacts.   The history is provided by the patient and medical records.  Abdominal Pain Associated symptoms: nausea and vomiting   Associated symptoms: no chest pain, no chills, no constipation, no cough, no diarrhea, no dysuria, no fever, no shortness of breath, no sore throat, no vaginal bleeding and no vaginal discharge        Prior to Admission medications   Medication Sig Start Date End Date Taking? Authorizing Provider  metroNIDAZOLE  (FLAGYL ) 500 MG tablet Take 1 tablet (500 mg total) by mouth 2 (two) times daily. 02/28/24   Constant, Peggy, MD  cephALEXin  (KEFLEX ) 500 MG capsule Take 1 capsule (500 mg total) by mouth 3 (three) times daily. 02/10/24   Tanda Bleacher, MD  losartan  (COZAAR ) 50 MG tablet Take 1 tablet (50 mg total) by mouth daily. Patient not taking: Reported on 02/07/2024 12/29/23   Tanda Bleacher, MD  losartan -hydrochlorothiazide (HYZAAR) 100-25 MG tablet TAKE 1 TABLET BY MOUTH EVERY DAY 06/27/24   Tanda Bleacher, MD  phentermine  (ADIPEX-P ) 37.5 MG tablet Take 1 tablet (37.5 mg total) by mouth daily before breakfast. 03/13/24   Tanda Bleacher, MD    Allergies: Codeine    Review of Systems  Constitutional:  Negative for chills and fever.  HENT:  Negative for sore throat.   Respiratory:  Negative for cough and shortness of  breath.   Cardiovascular:  Negative for chest pain and leg swelling.  Gastrointestinal:  Positive for abdominal pain, nausea and vomiting. Negative for constipation and diarrhea.  Genitourinary:  Negative for dysuria, flank pain, vaginal bleeding and vaginal discharge.  Musculoskeletal:  Negative for back pain and neck pain.  Skin:  Negative for rash.  Neurological:  Negative for headaches.    Updated Vital Signs BP (!) 149/95 (BP Location: Left Arm)   Pulse 67   Temp 98.5 F (36.9 C) (Oral)   Resp 20   Wt 120.2 kg   LMP 06/25/2024   SpO2 100%   BMI 41.50 kg/m   Physical Exam Vitals and nursing note reviewed.  Constitutional:      Appearance: Normal appearance. She is well-developed.  HENT:     Head: Atraumatic.     Nose: Nose normal.     Mouth/Throat:     Mouth: Mucous membranes are moist.  Eyes:     General: No scleral icterus.    Conjunctiva/sclera: Conjunctivae normal.     Pupils: Pupils are equal, round, and reactive to light.  Neck:     Trachea: No tracheal deviation.  Cardiovascular:     Rate and Rhythm: Normal rate and regular rhythm.     Pulses: Normal pulses.     Heart sounds: Normal heart sounds. No murmur heard.    No friction rub. No gallop.  Pulmonary:     Effort: Pulmonary effort is normal. No respiratory distress.  Breath sounds: Normal breath sounds.  Abdominal:     General: Bowel sounds are normal. There is no distension.     Palpations: Abdomen is soft. There is no mass.     Tenderness: There is abdominal tenderness. There is no guarding or rebound.     Hernia: No hernia is present.     Comments: Epigastric and RUQ tenderness.   Genitourinary:    Comments: No cva tenderness.  Musculoskeletal:        General: No swelling.     Cervical back: Normal range of motion and neck supple. No rigidity. No muscular tenderness.  Skin:    General: Skin is warm and dry.     Findings: No rash.  Neurological:     Mental Status: She is alert.      Comments: Alert, speech normal.   Psychiatric:        Mood and Affect: Mood normal.     (all labs ordered are listed, but only abnormal results are displayed) Results for orders placed or performed during the hospital encounter of 07/16/24  Urinalysis, Routine w reflex microscopic -Urine, Clean Catch   Collection Time: 07/16/24  7:53 AM  Result Value Ref Range   Color, Urine YELLOW YELLOW   APPearance CLEAR CLEAR   Specific Gravity, Urine 1.020 1.005 - 1.030   pH 5.0 5.0 - 8.0   Glucose, UA NEGATIVE NEGATIVE mg/dL   Hgb urine dipstick TRACE (A) NEGATIVE   Bilirubin Urine NEGATIVE NEGATIVE   Ketones, ur NEGATIVE NEGATIVE mg/dL   Protein, ur NEGATIVE NEGATIVE mg/dL   Nitrite NEGATIVE NEGATIVE   Leukocytes,Ua NEGATIVE NEGATIVE  Urinalysis, Microscopic (reflex)   Collection Time: 07/16/24  7:53 AM  Result Value Ref Range   RBC / HPF 0-5 0 - 5 RBC/hpf   WBC, UA 0-5 0 - 5 WBC/hpf   Bacteria, UA NONE SEEN NONE SEEN   Squamous Epithelial / HPF 0-5 0 - 5 /HPF  Lipase, blood   Collection Time: 07/16/24  7:59 AM  Result Value Ref Range   Lipase 22 11 - 51 U/L  Comprehensive metabolic panel   Collection Time: 07/16/24  7:59 AM  Result Value Ref Range   Sodium 137 135 - 145 mmol/L   Potassium 3.6 3.5 - 5.1 mmol/L   Chloride 103 98 - 111 mmol/L   CO2 23 22 - 32 mmol/L   Glucose, Bld 128 (H) 70 - 99 mg/dL   BUN 8 6 - 20 mg/dL   Creatinine, Ser 9.25 0.44 - 1.00 mg/dL   Calcium 9.2 8.9 - 89.6 mg/dL   Total Protein 7.8 6.5 - 8.1 g/dL   Albumin 4.3 3.5 - 5.0 g/dL   AST 26 15 - 41 U/L   ALT 27 0 - 44 U/L   Alkaline Phosphatase 76 38 - 126 U/L   Total Bilirubin 0.5 0.0 - 1.2 mg/dL   GFR, Estimated >39 >39 mL/min   Anion gap 11 5 - 15  CBC   Collection Time: 07/16/24  7:59 AM  Result Value Ref Range   WBC 4.6 4.0 - 10.5 K/uL   RBC 4.84 3.87 - 5.11 MIL/uL   Hemoglobin 13.0 12.0 - 15.0 g/dL   HCT 58.8 63.9 - 53.9 %   MCV 84.9 80.0 - 100.0 fL   MCH 26.9 26.0 - 34.0 pg   MCHC 31.6  30.0 - 36.0 g/dL   RDW 85.9 88.4 - 84.4 %   Platelets 312 150 - 400 K/uL   nRBC 0.0  0.0 - 0.2 %  hCG, serum, qualitative   Collection Time: 07/16/24  7:59 AM  Result Value Ref Range   Preg, Serum NEGATIVE NEGATIVE  Urine Drug Screen   Collection Time: 07/16/24 10:37 AM  Result Value Ref Range   Opiates NEGATIVE NEGATIVE   Cocaine NEGATIVE NEGATIVE   Benzodiazepines NEGATIVE NEGATIVE   Amphetamines NEGATIVE NEGATIVE   Tetrahydrocannabinol NEGATIVE NEGATIVE   Barbiturates NEGATIVE NEGATIVE   Methadone Scn, Ur NEGATIVE NEGATIVE   Fentanyl  NEGATIVE NEGATIVE      EKG: EKG Interpretation Date/Time:  Sunday July 16 2024 08:02:34 EDT Ventricular Rate:  73 PR Interval:  203 QRS Duration:  108 QT Interval:  393 QTC Calculation: 433 R Axis:   70  Text Interpretation: Sinus rhythm No significant change since last tracing Confirmed by Bernard Drivers ([id number]) on 07/16/2024 8:07:33 AM  Radiology: US  Abdomen Limited RUQ (LIVER/GB) Result Date: 07/16/2024 EXAM: Right Upper Quadrant Abdominal Ultrasound TECHNIQUE: Real-time ultrasonography of the right upper quadrant of the abdomen was performed. COMPARISON: None. CLINICAL HISTORY: Abdominal pain. FINDINGS: LIVER: The liver demonstrates normal echogenicity. No intrahepatic biliary ductal dilatation. No mass. BILIARY SYSTEM: The gallbladder lumen is filled with echogenic gallstones. Gallstones range from 10 to 14 mm. Positive sonographic Murphy sign. No evidence of pericholecystic fluid or wall thickening. Common bile duct is dilated to 9 mm. No choledocholithiasis identified on CT exam. RIGHT KIDNEY: The right kidney is grossly unremarkable in appearances without evidence of hydronephrosis, echogenic calculi or worrisome mass lesions. PANCREAS: Visualized portions of the pancreas are unremarkable. OTHER: No right upper quadrant ascites. IMPRESSION: 1. Ultrasound findings concerning for acute cholecystitis. Recommend clinical correlation 2.  Dilatation of the proximal common bile duct. No choledocholithiasis identified on CT exam. Electronically signed by: Norleen Boxer MD 07/16/2024 12:17 PM EDT RP Workstation: [id number]   CT ABDOMEN PELVIS W CONTRAST Result Date: 07/16/2024 EXAM: CT ABDOMEN AND PELVIS WITH CONTRAST 07/16/2024 10:05:00 AM TECHNIQUE: CT of the abdomen and pelvis was performed with the administration of 100 mL of iohexol (OMNIPAQUE) 300 MG/ML solution. Multiplanar reformatted images are provided for review. Automated exposure control, iterative reconstruction, and/or weight-based adjustment of the mA/kV was utilized to reduce the radiation dose to as low as reasonably achievable. COMPARISON: None available. CLINICAL HISTORY: 38 year old female with acute nonlocalized abdominal pain, mid abdominal pain started today, emesis, and shortness of breath. FINDINGS: LOWER CHEST: Low lung volumes. Mild lung base atelectasis. Heart size upper limits of normal. LIVER: No intrahepatic biliary ductal dilatation. GALLBLADDER AND BILE DUCTS: Extensive cholelithiasis. Individual stones up to 12 mm. The gallbladder appears mildly distended, but no surrounding inflammation by CT. There is a stone identified lodged in the neck of the gallbladder on both series 2 image 18 and coronal image 90. That stone measures 11 mm. There is associated common bile duct (CBD) dilatation 9 mm. SPLEEN: No acute abnormality. PANCREAS: No acute abnormality. ADRENAL GLANDS: No acute abnormality. KIDNEYS, URETERS AND BLADDER: No stones in the kidneys or ureters. No hydronephrosis. No perinephric or periureteral stranding. Urinary bladder is unremarkable. GI AND BOWEL: Stomach demonstrates no acute abnormality. Redundant large bowel. Normal appendix on coronal image 79. There is no bowel obstruction. PERITONEUM AND RETROPERITONEUM: Small volume of free fluid is mildly complex but primarily in the cul de sac and is probably physiologic. No free air. VASCULATURE: Aorta is  normal in caliber. LYMPH NODES: No lymphadenopathy. REPRODUCTIVE ORGANS: Uterus and adnexa within normal limits. BONES AND SOFT TISSUES: Pelvic phleboliths. No acute osseous abnormality. No focal soft  tissue abnormality. IMPRESSION: 1. Extensive cholithiasis with a gallbladder neck stone (11 mm) and common bile duct dilatation (9 mm). Consider developing acute cholecystitis and/or Mirizzi syndrome. Electronically signed by: Helayne Hurst MD 07/16/2024 10:37 AM EDT RP Workstation: [id number]     Procedures   Medications Ordered in the ED  morphine  (PF) 4 MG/ML injection 4 mg (has no administration in time range)  piperacillin-tazobactam (ZOSYN) IVPB 3.375 g (has no administration in time range)  lactated ringers  bolus 1,000 mL (0 mLs Intravenous Stopped 07/16/24 1014)  ondansetron  (ZOFRAN ) injection 4 mg (4 mg Intravenous Given 07/16/24 0820)  pantoprazole (PROTONIX) injection 40 mg (40 mg Intravenous Given 07/16/24 0820)  HYDROmorphone (DILAUDID) injection 0.5 mg (0.5 mg Intravenous Given 07/16/24 0820)  iohexol (OMNIPAQUE) 300 MG/ML solution 100 mL (100 mLs Intravenous Contrast Given 07/16/24 1001)  morphine  (PF) 4 MG/ML injection 4 mg (4 mg Intravenous Given 07/16/24 1101)                                    Medical Decision Making Problems Addressed: Acute cholecystitis: acute illness or injury with systemic symptoms that poses a threat to life or bodily functions Biliary colic: acute illness or injury with systemic symptoms Gallstones: chronic illness or injury Nausea and vomiting, unspecified vomiting type: acute illness or injury with systemic symptoms Upper abdominal pain: acute illness or injury with systemic symptoms that poses a threat to life or bodily functions  Amount and/or Complexity of Data Reviewed External Data Reviewed: notes. Labs: ordered. Decision-making details documented in ED Course. Radiology: ordered and independent interpretation performed. Decision-making  details documented in ED Course. ECG/medicine tests: ordered and independent interpretation performed. Decision-making details documented in ED Course.  Risk Prescription drug management. Parenteral controlled substances. Decision regarding hospitalization.   Iv ns. Continuous pulse ox and cardiac monitoring. Labs ordered/sent. Imaging ordered.   Differential diagnosis includes gastritis, pud, pancreatitis, biliary colic, GE, etc. Dispo decision including potential need for admission considered - will get labs and imaging and reassess.   Reviewed nursing notes and prior charts for additional history. External reports reviewed.   LR bolus. Protonix iv. Zofran  iv. Dilaudid .5 mg iv.   Labs reviewed/interpreted by me - wbc and hgb normal. Chem unremarkable, lfts and lipase normal. Ua neg for uti.   CT reviewed/interpreted by me - multiple gallstones. Will get u/s.   U/s reviewed/interpreted by me -  gallstones ?cholecystitis. Zosyn iv.   General surgery consulted. Discussed pt with Dr Curvin who requests pt be taken to [facility] ED so he can see there.  Discussed w EDP, Dr Francesca who accepts pt.   Pain persists but mildly improved from initial. Additional iv pain meds given.   Recheck, pt more comfortable but pain persists.   [facility] transport to [facility].      Final diagnoses:  None    ED Discharge Orders     None          Bernard Drivers, MD 07/16/24 [phone]

## 2024-07-16 NOTE — H&P (Signed)
 Marisa Yu is an 38 y.o. female.   Chief Complaint: Abdominal pain HPI: The patient is a 38 year old black female who began having acute abdominal pain yesterday.  She describes the pain as severe.  The pain was located in her right upper quadrant and radiated into her back.  The pain was associated with significant nausea and vomiting.  She came to the emergency department where a scan showed stones in the gallbladder.  Since coming to the emergency department her pain has almost resolved.  She is otherwise in good health except for some hypertension.  She does not smoke.  Past Medical History:  Diagnosis Date   Anemia    Anxiety    Boil, arm    Family history of BRCA1 gene positive 01/18/2018   Family history of breast cancer    GERD (gastroesophageal reflux disease)    Headache    Hypertension    Hypothyroidism    Tooth ache    Vaginal Pap smear, abnormal     Past Surgical History:  Procedure Laterality Date   CESAREAN SECTION  2004   CESAREAN SECTION  2009   CESAREAN SECTION WITH BILATERAL TUBAL LIGATION Bilateral 08/21/2015   Procedure: CESAREAN SECTION WITH BILATERAL TUBAL LIGATION;  Surgeon: Carlin DELENA Centers, MD;  Location: [facility];  Service: Obstetrics;  Laterality: Bilateral;   CYST EXCISION     WISDOM TOOTH EXTRACTION      Family History  Problem Relation Age of Onset   Cancer Mother    Breast cancer Maternal Aunt        dx 47's   Breast cancer Maternal Aunt        dx 40's   Breast cancer Maternal Grandmother    Heart disease Maternal Grandfather    Breast cancer Cousin 25       BRCA1 +   Breast cancer Cousin 29   Other Cousin        BRCA1 +   Breast cancer Cousin 29   Breast cancer Cousin 66   Asthma Son    Social History:  reports that she quit smoking about 12 years ago. Her smoking use included cigarettes. She has never used smokeless tobacco. She reports current alcohol use. She reports that she does not use drugs.  Allergies:  Allergies   Allergen Reactions   Codeine Itching    (Not in a hospital admission)   Results for orders placed or performed during the hospital encounter of 07/16/24 (from the past 48 hours)  Urinalysis, Routine w reflex microscopic -Urine, Clean Catch     Status: Abnormal   Collection Time: 07/16/24  7:53 AM  Result Value Ref Range   Color, Urine YELLOW YELLOW   APPearance CLEAR CLEAR   Specific Gravity, Urine 1.020 1.005 - 1.030   pH 5.0 5.0 - 8.0   Glucose, UA NEGATIVE NEGATIVE mg/dL   Hgb urine dipstick TRACE (A) NEGATIVE   Bilirubin Urine NEGATIVE NEGATIVE   Ketones, ur NEGATIVE NEGATIVE mg/dL   Protein, ur NEGATIVE NEGATIVE mg/dL   Nitrite NEGATIVE NEGATIVE   Leukocytes,Ua NEGATIVE NEGATIVE    Comment: Performed at [facility], [address] [place], [place] [address]  Urinalysis, Microscopic (reflex)     Status: None   Collection Time: 07/16/24  7:53 AM  Result Value Ref Range   RBC / HPF 0-5 0 - 5 RBC/hpf   WBC, UA 0-5 0 - 5 WBC/hpf   Bacteria, UA NONE SEEN NONE SEEN   Squamous  Epithelial / HPF 0-5 0 - 5 /HPF    Comment: Performed at [facility], [address] [place], [place] [address]  Lipase, blood     Status: None   Collection Time: 07/16/24  7:59 AM  Result Value Ref Range   Lipase 22 11 - 51 U/L    Comment: Performed at [facility], [address] [place], [place] [address]  Comprehensive metabolic panel     Status: Abnormal   Collection Time: 07/16/24  7:59 AM  Result Value Ref Range   Sodium 137 135 - 145 mmol/L   Potassium 3.6 3.5 - 5.1 mmol/L   Chloride 103 98 - 111 mmol/L   CO2 23 22 - 32 mmol/L   Glucose, Bld 128 (H) 70 - 99 mg/dL    Comment: Glucose reference range applies only to samples taken after fasting for at least 8 hours.   BUN 8 6 - 20 mg/dL   Creatinine, Ser 9.25 0.44 - 1.00 mg/dL   Calcium 9.2 8.9 - 89.6 mg/dL   Total Protein 7.8 6.5 - 8.1 g/dL   Albumin 4.3 3.5 - 5.0 g/dL   AST 26 15 - 41 U/L   ALT 27 0  - 44 U/L   Alkaline Phosphatase 76 38 - 126 U/L   Total Bilirubin 0.5 0.0 - 1.2 mg/dL   GFR, Estimated >39 >39 mL/min    Comment: (NOTE) Calculated using the CKD-EPI Creatinine Equation (2021)    Anion gap 11 5 - 15    Comment: Performed at [facility], [address] [place], [place] [address]  CBC     Status: None   Collection Time: 07/16/24  7:59 AM  Result Value Ref Range   WBC 4.6 4.0 - 10.5 K/uL   RBC 4.84 3.87 - 5.11 MIL/uL   Hemoglobin 13.0 12.0 - 15.0 g/dL   HCT 58.8 63.9 - 53.9 %   MCV 84.9 80.0 - 100.0 fL   MCH 26.9 26.0 - 34.0 pg   MCHC 31.6 30.0 - 36.0 g/dL   RDW 85.9 88.4 - 84.4 %   Platelets 312 150 - 400 K/uL   nRBC 0.0 0.0 - 0.2 %    Comment: Performed at [facility], [address] [place], [place] [address]  hCG, serum, qualitative     Status: None   Collection Time: 07/16/24  7:59 AM  Result Value Ref Range   Preg, Serum NEGATIVE NEGATIVE    Comment:        THE SENSITIVITY OF THIS METHODOLOGY IS >10 mIU/mL. Performed at [facility], [address] [place], [place] [address]   Urine Drug Screen     Status: None   Collection Time: 07/16/24 10:37 AM  Result Value Ref Range   Opiates NEGATIVE NEGATIVE   Cocaine NEGATIVE NEGATIVE   Benzodiazepines NEGATIVE NEGATIVE   Amphetamines NEGATIVE NEGATIVE   Tetrahydrocannabinol NEGATIVE NEGATIVE   Barbiturates NEGATIVE NEGATIVE   Methadone Scn, Ur NEGATIVE NEGATIVE   Fentanyl  NEGATIVE NEGATIVE    Comment: (NOTE) Drug screen is for Medical Purposes only. Positive results are preliminary only. If confirmation is needed, notify lab within 5 days.  Drug Class                 Cutoff (ng/mL) Amphetamine and metabolites 1000 Barbiturate and metabolites 200 Benzodiazepine              200 Opiates and metabolites  300 Cocaine and metabolites     300 THC                         50 Fentanyl                     5 Methadone                   300  Trazodone is metabolized in  vivo to several metabolites,  including pharmacologically active m-CPP, which is excreted in the  urine.  Immunoassay screens for amphetamines and MDMA have potential  cross-reactivity with these compounds and may provide false positive  result.  Performed at [facility], [address] [place], [place] [address]    US  Abdomen Limited RUQ (LIVER/GB) Result Date: 07/16/2024 EXAM: Right Upper Quadrant Abdominal Ultrasound TECHNIQUE: Real-time ultrasonography of the right upper quadrant of the abdomen was performed. COMPARISON: None. CLINICAL HISTORY: Abdominal pain. FINDINGS: LIVER: The liver demonstrates normal echogenicity. No intrahepatic biliary ductal dilatation. No mass. BILIARY SYSTEM: The gallbladder lumen is filled with echogenic gallstones. Gallstones range from 10 to 14 mm. Positive sonographic Murphy sign. No evidence of pericholecystic fluid or wall thickening. Common bile duct is dilated to 9 mm. No choledocholithiasis identified on CT exam. RIGHT KIDNEY: The right kidney is grossly unremarkable in appearances without evidence of hydronephrosis, echogenic calculi or worrisome mass lesions. PANCREAS: Visualized portions of the pancreas are unremarkable. OTHER: No right upper quadrant ascites. IMPRESSION: 1. Ultrasound findings concerning for acute cholecystitis. Recommend clinical correlation 2. Dilatation of the proximal common bile duct. No choledocholithiasis identified on CT exam. Electronically signed by: Norleen Boxer MD 07/16/2024 12:17 PM EDT RP Workstation: [id number]   CT ABDOMEN PELVIS W CONTRAST Result Date: 07/16/2024 EXAM: CT ABDOMEN AND PELVIS WITH CONTRAST 07/16/2024 10:05:00 AM TECHNIQUE: CT of the abdomen and pelvis was performed with the administration of 100 mL of iohexol (OMNIPAQUE) 300 MG/ML solution. Multiplanar reformatted images are provided for review. Automated exposure control, iterative reconstruction, and/or weight-based adjustment of the mA/kV  was utilized to reduce the radiation dose to as low as reasonably achievable. COMPARISON: None available. CLINICAL HISTORY: 38 year old female with acute nonlocalized abdominal pain, mid abdominal pain started today, emesis, and shortness of breath. FINDINGS: LOWER CHEST: Low lung volumes. Mild lung base atelectasis. Heart size upper limits of normal. LIVER: No intrahepatic biliary ductal dilatation. GALLBLADDER AND BILE DUCTS: Extensive cholelithiasis. Individual stones up to 12 mm. The gallbladder appears mildly distended, but no surrounding inflammation by CT. There is a stone identified lodged in the neck of the gallbladder on both series 2 image 18 and coronal image 90. That stone measures 11 mm. There is associated common bile duct (CBD) dilatation 9 mm. SPLEEN: No acute abnormality. PANCREAS: No acute abnormality. ADRENAL GLANDS: No acute abnormality. KIDNEYS, URETERS AND BLADDER: No stones in the kidneys or ureters. No hydronephrosis. No perinephric or periureteral stranding. Urinary bladder is unremarkable. GI AND BOWEL: Stomach demonstrates no acute abnormality. Redundant large bowel. Normal appendix on coronal image 79. There is no bowel obstruction. PERITONEUM AND RETROPERITONEUM: Small volume of free fluid is mildly complex but primarily in the cul de sac and is probably physiologic. No free air. VASCULATURE: Aorta is normal in caliber. LYMPH NODES: No lymphadenopathy. REPRODUCTIVE ORGANS: Uterus and adnexa within normal limits. BONES AND SOFT TISSUES: Pelvic phleboliths. No acute osseous abnormality. No focal soft tissue abnormality. IMPRESSION: 1. Extensive cholithiasis with a gallbladder neck stone (  11 mm) and common bile duct dilatation (9 mm). Consider developing acute cholecystitis and/or Mirizzi syndrome. Electronically signed by: Helayne Hurst MD 07/16/2024 10:37 AM EDT RP Workstation: [id number]    Review of Systems  Constitutional: Negative.   HENT: Negative.    Eyes: Negative.    Respiratory: Negative.    Cardiovascular: Negative.   Gastrointestinal:  Positive for abdominal pain, nausea and vomiting.  Endocrine: Negative.   Genitourinary: Negative.   Musculoskeletal: Negative.   Skin: Negative.   Allergic/Immunologic: Negative.   Neurological: Negative.   Hematological: Negative.   Psychiatric/Behavioral: Negative.      Blood pressure (!) 147/93, pulse 67, temperature (!) 97.4 F (36.3 C), temperature source Oral, resp. rate 20, weight 120.2 kg, last menstrual period 06/25/2024, SpO2 98%. Physical Exam Vitals reviewed.  Constitutional:      General: She is not in acute distress.    Appearance: Normal appearance. She is obese.  HENT:     Head: Normocephalic and atraumatic.     Right Ear: External ear normal.     Left Ear: External ear normal.     Nose: Nose normal.     Mouth/Throat:     Mouth: Mucous membranes are moist.     Pharynx: Oropharynx is clear.  Eyes:     General: No scleral icterus.    Extraocular Movements: Extraocular movements intact.     Conjunctiva/sclera: Conjunctivae normal.     Pupils: Pupils are equal, round, and reactive to light.  Cardiovascular:     Rate and Rhythm: Normal rate and regular rhythm.     Pulses: Normal pulses.     Heart sounds: Normal heart sounds.  Pulmonary:     Effort: Pulmonary effort is normal. No respiratory distress.     Breath sounds: Normal breath sounds.  Abdominal:     General: Abdomen is flat.     Palpations: Abdomen is soft.     Comments: There is mild tenderness in the right upper quadrant  Musculoskeletal:        General: No swelling or deformity. Normal range of motion.     Cervical back: Normal range of motion and neck supple.  Skin:    General: Skin is warm and dry.     Coloration: Skin is not jaundiced.  Neurological:     General: No focal deficit present.     Mental Status: She is alert and oriented to person, place, and time.  Psychiatric:        Mood and Affect: Mood normal.         Behavior: Behavior normal.      Assessment/Plan The patient appears to have symptomatic gallstones.  Because of the risk of further painful episodes and possible pancreatitis she would likely benefit from having her gallbladder removed.  I have discussed with her this situation and she understands.  We will plan to admit her to the hospital tonight for IV hydration and pain management.  We will talk to the primary team in the morning about timing of gallbladder surgery.  Deward Null III, MD 07/16/2024, 5:59 PM

## 2024-07-16 NOTE — Discharge Instructions (Addendum)
 [facility]  LAPAROSCOPIC SURGERY: POST OP INSTRUCTIONS Always review your discharge instruction sheet given to you by the facility where your surgery was performed. IF YOU HAVE DISABILITY OR FAMILY LEAVE FORMS, YOU MUST BRING THEM TO THE OFFICE FOR PROCESSING.   DO NOT GIVE THEM TO YOUR DOCTOR.  PAIN CONTROL  Pain regimen: take over-the-counter tylenol  (acetaminophen ) 1000mg  every six hours, the prescription ibuprofen  (600mg ) every six hours and the robaxin  (methocarbamol ) 750mg  every six hours. With all three of these, you should be taking something every two hours. Example: tylenol  (acetaminophen ) at 8am, ibuprofen  at 10am, robaxin  (methocarbamol ) at 12pm, tylenol  (acetaminophen ) again at 2pm, ibuprofen  again at 4pm, robaxin  (methocarbamol ) at 6pm. You also have a prescription for oxycodone, which should be taken if the tylenol  (acetaminophen ), ibuprofen , and robaxin  (methocarbamol ) are not enough to control your pain. You may take the oxycodone as frequently as every four hours as needed, but if you are taking the other medications as above, you should not need the oxycodone this frequently. You have also been given a prescription for Miralax which is a stool softener. Please take this as prescribed because the oxycodone can cause constipation and the Miralax will minimize or prevent constipation. Do not drive while taking or under the influence of the oxycodone as it is a narcotic medication. Use ice packs to help control pain.  If you need a refill on your pain medication, please contact your pharmacy.  They will contact our office to request authorization. Prescriptions will not be filled after 5pm or on week-ends.  HOME MEDICATIONS Take your usually prescribed medications unless otherwise directed.  DIET You should follow a light diet the first few days after arrival home.  Be sure to include lots of fluids daily.  Do not consume alcohol while taking oxycodone or  ibuprofen .   CONSTIPATION It is common to experience some constipation after surgery and if you are taking pain medication. Constipation will make your abdominal pain worse, so it is best to try to prevent it by increasing fluid intake and taking a stool softener. You have already been given a prescription for a mild laxative, Miralax, which you should be taking once daily. You can increase the Miralax to twice daily or even three times daily until you have a bowel movement. If still no bowel movement 24 hours after taking Miralax three times in one day, you may try magnesium  citrate, available over the counter at a local pharmacy.   WOUND/INCISION CARE Most patients will experience some swelling and bruising in the area of the incisions.  Ice packs will help.  Swelling and bruising can take several days to resolve.  May shower beginning 07/18/2024.  Do not peel off or scrub skin glue. May allow warm soapy water to run over incision, then rinse and pat dry.  Do not soak in any water (tubs, hot tubs, pools, lakes, oceans) for one week.   ACTIVITIES You may resume regular (light) daily activities beginning the next day--such as daily self-care, walking, climbing stairs--gradually increasing activities as tolerated.  You may have sexual intercourse when it is comfortable.   No lifting greater than 5 pounds for six weeks.  You may drive when you are no longer taking narcotic pain medication, you can comfortably wear a seatbelt, and you can safely maneuver your car and apply brakes.  FOLLOW-UP You should see your doctor in the office for a follow-up appointment approximately 2-3 weeks after your surgery.  You should have  been given your post-op/follow-up appointment when your surgery was scheduled.  If you did not receive a post-op/follow-up appointment, make sure that you call for this appointment within a day or two after you arrive home to ensure a convenient appointment time.  WHEN TO CALL YOUR  DOCTOR: Fever over 101.5 Inability to urinate Continued bleeding from incision. Increased pain, redness, or drainage from the incision. Increasing abdominal pain  The clinic staff is available to answer your questions during regular business hours.  Please don't hesitate to call and ask to speak to one of the nurses for clinical concerns.  If you have a medical emergency, go to the nearest emergency room or call 911.  A surgeon from [facility] is always on call at the hospital. [address], [address], [place], [place]  [address] ? [address] [address], [place], [place]   [address] [phone] ? [phone] ? FAX [phone] Web site: [email or web address]

## 2024-07-17 ENCOUNTER — Observation Stay (HOSPITAL_BASED_OUTPATIENT_CLINIC_OR_DEPARTMENT_OTHER): Payer: Self-pay | Admitting: Certified Registered Nurse Anesthetist

## 2024-07-17 ENCOUNTER — Observation Stay (HOSPITAL_COMMUNITY): Payer: Self-pay | Admitting: Certified Registered Nurse Anesthetist

## 2024-07-17 ENCOUNTER — Encounter (HOSPITAL_COMMUNITY): Payer: Self-pay

## 2024-07-17 ENCOUNTER — Encounter (HOSPITAL_COMMUNITY)
Admission: EM | Disposition: A | Discharge status: Home / Self Care | Payer: Self-pay | Source: Home / Self Care | Attending: Emergency Medicine

## 2024-07-17 DIAGNOSIS — K8010 Calculus of gallbladder with chronic cholecystitis without obstruction: Secondary | ICD-10-CM

## 2024-07-17 DIAGNOSIS — Z87891 Personal history of nicotine dependence: Secondary | ICD-10-CM

## 2024-07-17 DIAGNOSIS — I10 Essential (primary) hypertension: Secondary | ICD-10-CM

## 2024-07-17 HISTORY — PX: CHOLECYSTECTOMY: SHX55

## 2024-07-17 SURGERY — LAPAROSCOPIC CHOLECYSTECTOMY
Anesthesia: General

## 2024-07-17 MED ORDER — PROPOFOL 10 MG/ML IV BOLUS
INTRAVENOUS | Status: AC
  Filled 2024-07-17: qty 20

## 2024-07-17 MED ORDER — ACETAMINOPHEN 10 MG/ML IV SOLN
INTRAVENOUS | Status: DC | PRN
  Administered 2024-07-17: 1000 mg via INTRAVENOUS

## 2024-07-17 MED ORDER — CEFAZOLIN SODIUM-DEXTROSE 2-3 GM-%(50ML) IV SOLR
INTRAVENOUS | Status: DC | PRN
  Administered 2024-07-17: 2 g via INTRAVENOUS

## 2024-07-17 MED ORDER — BUPIVACAINE-EPINEPHRINE 0.25% -1:200000 IJ SOLN
INTRAMUSCULAR | Status: DC | PRN
  Administered 2024-07-17: 30 mL

## 2024-07-17 MED ORDER — HYDROMORPHONE HCL 1 MG/ML IJ SOLN
0.5000 mg | INTRAMUSCULAR | Status: DC | PRN
  Administered 2024-07-17: 0.5 mg via INTRAVENOUS
  Filled 2024-07-17: qty 0.5

## 2024-07-17 MED ORDER — ACETAMINOPHEN 500 MG PO TABS: 1000.0000 mg | ORAL_TABLET | Freq: Four times a day (QID) | ORAL | 3 refills | Status: AC

## 2024-07-17 MED ORDER — HYDRALAZINE HCL 20 MG/ML IJ SOLN
INTRAMUSCULAR | Status: DC | PRN
  Administered 2024-07-17: 20 mg via INTRAVENOUS

## 2024-07-17 MED ORDER — LACTATED RINGERS IR SOLN
Status: DC | PRN
  Administered 2024-07-17: 1000 mL

## 2024-07-17 MED ORDER — MIDAZOLAM HCL (PF) 2 MG/2ML IJ SOLN
INTRAMUSCULAR | Status: DC | PRN
  Administered 2024-07-17 (×2): 1 mg via INTRAVENOUS

## 2024-07-17 MED ORDER — 0.9 % SODIUM CHLORIDE (POUR BTL) OPTIME
TOPICAL | Status: DC | PRN
  Administered 2024-07-17: 1000 mL

## 2024-07-17 MED ORDER — ACETAMINOPHEN 10 MG/ML IV SOLN
INTRAVENOUS | Status: AC
  Filled 2024-07-17: qty 100

## 2024-07-17 MED ORDER — ACETAMINOPHEN 10 MG/ML IV SOLN: 1000.0000 mg | Freq: Once | INTRAVENOUS | Status: DC | PRN

## 2024-07-17 MED ORDER — ACETAMINOPHEN 500 MG PO TABS
1000.0000 mg | ORAL_TABLET | Freq: Four times a day (QID) | ORAL | Status: DC
  Administered 2024-07-17 – 2024-07-18 (×3): 1000 mg via ORAL
  Filled 2024-07-17 (×4): qty 2

## 2024-07-17 MED ORDER — SUGAMMADEX SODIUM 200 MG/2ML IV SOLN
INTRAVENOUS | Status: DC | PRN
  Administered 2024-07-17: 300 mg via INTRAVENOUS

## 2024-07-17 MED ORDER — CHLORHEXIDINE GLUCONATE 0.12 % MT SOLN
15.0000 mL | Freq: Once | OROMUCOSAL | Status: AC
  Administered 2024-07-17: 15 mL via OROMUCOSAL

## 2024-07-17 MED ORDER — LIDOCAINE HCL (PF) 2 % IJ SOLN
INTRAMUSCULAR | Status: DC | PRN
  Administered 2024-07-17: 60 mg via INTRADERMAL

## 2024-07-17 MED ORDER — ORAL CARE MOUTH RINSE: 15.0000 mL | Freq: Once | OROMUCOSAL | Status: AC

## 2024-07-17 MED ORDER — METHOCARBAMOL 750 MG PO TABS: 750.0000 mg | ORAL_TABLET | Freq: Four times a day (QID) | ORAL | 1 refills | Status: AC

## 2024-07-17 MED ORDER — FENTANYL CITRATE (PF) 50 MCG/ML IJ SOSY
PREFILLED_SYRINGE | INTRAMUSCULAR | Status: AC
  Filled 2024-07-17: qty 3

## 2024-07-17 MED ORDER — CEFAZOLIN SODIUM 1 G IJ SOLR
INTRAMUSCULAR | Status: AC
Start: 2024-07-17 — End: 2024-07-17
  Filled 2024-07-17: qty 20

## 2024-07-17 MED ORDER — ONDANSETRON HCL 4 MG/2ML IJ SOLN
INTRAMUSCULAR | Status: DC | PRN
  Administered 2024-07-17: 4 mg via INTRAVENOUS

## 2024-07-17 MED ORDER — FENTANYL CITRATE (PF) 250 MCG/5ML IJ SOLN
INTRAMUSCULAR | Status: AC
  Filled 2024-07-17: qty 5

## 2024-07-17 MED ORDER — DEXAMETHASONE SOD PHOSPHATE PF 10 MG/ML IJ SOLN
INTRAMUSCULAR | Status: DC | PRN
  Administered 2024-07-17: 10 mg via INTRAVENOUS

## 2024-07-17 MED ORDER — FENTANYL CITRATE (PF) 250 MCG/5ML IJ SOLN
INTRAMUSCULAR | Status: DC | PRN
  Administered 2024-07-17 (×5): 50 ug via INTRAVENOUS

## 2024-07-17 MED ORDER — METHOCARBAMOL 500 MG PO TABS
750.0000 mg | ORAL_TABLET | Freq: Three times a day (TID) | ORAL | Status: DC | PRN
  Administered 2024-07-17: 750 mg via ORAL
  Filled 2024-07-17: qty 2

## 2024-07-17 MED ORDER — OXYCODONE HCL 5 MG PO TABS
ORAL_TABLET | ORAL | Status: AC
  Filled 2024-07-17: qty 1

## 2024-07-17 MED ORDER — OXYCODONE HCL 5 MG PO TABS
5.0000 mg | ORAL_TABLET | Freq: Once | ORAL | Status: AC | PRN
  Administered 2024-07-17: 5 mg via ORAL

## 2024-07-17 MED ORDER — HYDRALAZINE HCL 20 MG/ML IJ SOLN
INTRAMUSCULAR | Status: AC
  Filled 2024-07-17: qty 1

## 2024-07-17 MED ORDER — CEFAZOLIN SODIUM-DEXTROSE 2-3 GM-%(50ML) IV SOLR: INTRAVENOUS | Status: DC | PRN

## 2024-07-17 MED ORDER — ROCURONIUM BROMIDE 10 MG/ML (PF) SYRINGE
PREFILLED_SYRINGE | INTRAVENOUS | Status: DC | PRN
  Administered 2024-07-17: 80 mg via INTRAVENOUS

## 2024-07-17 MED ORDER — OXYCODONE HCL 5 MG PO TABS: 5.0000 mg | ORAL_TABLET | ORAL | 0 refills | Status: AC | PRN

## 2024-07-17 MED ORDER — LACTATED RINGERS IV SOLN: INTRAVENOUS | Status: DC

## 2024-07-17 MED ORDER — PROPOFOL 10 MG/ML IV BOLUS
INTRAVENOUS | Status: DC | PRN
  Administered 2024-07-17: 160 mg via INTRAVENOUS
  Administered 2024-07-17: 40 mg via INTRAVENOUS

## 2024-07-17 MED ORDER — POLYETHYLENE GLYCOL 3350 17 G PO PACK: 17.0000 g | PACK | Freq: Every day | ORAL | 1 refills | Status: AC

## 2024-07-17 MED ORDER — OXYCODONE HCL 5 MG PO TABS
5.0000 mg | ORAL_TABLET | ORAL | Status: DC | PRN
  Administered 2024-07-17: 5 mg via ORAL
  Administered 2024-07-17: 10 mg via ORAL
  Filled 2024-07-17: qty 1
  Filled 2024-07-17 (×2): qty 2

## 2024-07-17 MED ORDER — MIDAZOLAM HCL 2 MG/2ML IJ SOLN
INTRAMUSCULAR | Status: AC
  Filled 2024-07-17: qty 2

## 2024-07-17 MED ORDER — BUPIVACAINE-EPINEPHRINE (PF) 0.25% -1:200000 IJ SOLN
INTRAMUSCULAR | Status: AC
  Filled 2024-07-17: qty 30

## 2024-07-17 MED ORDER — OXYCODONE HCL 5 MG/5ML PO SOLN: 5.0000 mg | Freq: Once | ORAL | Status: AC | PRN

## 2024-07-17 MED ORDER — FENTANYL CITRATE (PF) 50 MCG/ML IJ SOSY
25.0000 ug | PREFILLED_SYRINGE | INTRAMUSCULAR | Status: DC | PRN
  Administered 2024-07-17 (×3): 50 ug via INTRAVENOUS

## 2024-07-17 MED ORDER — IBUPROFEN 600 MG PO TABS: 600.0000 mg | ORAL_TABLET | Freq: Four times a day (QID) | ORAL | 1 refills | Status: AC

## 2024-07-17 MED ORDER — METOPROLOL TARTRATE 5 MG/5ML IV SOLN
INTRAVENOUS | Status: DC | PRN
  Administered 2024-07-17 (×2): 2.5 mg via INTRAVENOUS

## 2024-07-17 MED ORDER — METOPROLOL TARTRATE 5 MG/5ML IV SOLN
INTRAVENOUS | Status: AC
  Filled 2024-07-17: qty 5

## 2024-07-17 SURGICAL SUPPLY — 37 items
BLADE CLIPPER SURG (BLADE) IMPLANT
CABLE HIGH FREQUENCY MONO STRZ (ELECTRODE) ×1 IMPLANT
CHLORAPREP W/TINT 26 (MISCELLANEOUS) ×1 IMPLANT
CLIP APPLIE 5 13 M/L LIGAMAX5 (MISCELLANEOUS) ×1 IMPLANT
COVER MAYO STAND XLG (MISCELLANEOUS) IMPLANT
COVER SURGICAL LIGHT HANDLE (MISCELLANEOUS) ×1 IMPLANT
DERMABOND ADVANCED .7 DNX12 (GAUZE/BANDAGES/DRESSINGS) ×1 IMPLANT
DISSECTOR BLUNT TIP ENDO 5MM (MISCELLANEOUS) IMPLANT
DRAPE C-ARM 42X120 X-RAY (DRAPES) IMPLANT
ELECT PENCIL ROCKER SW 15FT (MISCELLANEOUS) ×1 IMPLANT
ELECT REM PT RETURN 15FT ADLT (MISCELLANEOUS) ×1 IMPLANT
ENDOLOOP SUT PDS II 0 18 (SUTURE) IMPLANT
GLOVE BIO SURGEON STRL SZ 6.5 (GLOVE) ×1 IMPLANT
GLOVE BIOGEL PI IND STRL 6 (GLOVE) ×1 IMPLANT
GOWN STRL REUS W/ TWL LRG LVL3 (GOWN DISPOSABLE) ×1 IMPLANT
GOWN STRL REUS W/ TWL XL LVL3 (GOWN DISPOSABLE) IMPLANT
IRRIGATION SUCT STRKRFLW 2 WTP (MISCELLANEOUS) IMPLANT
KIT BASIN OR (CUSTOM PROCEDURE TRAY) ×1 IMPLANT
LHOOK LAP DISP 36CM (ELECTROSURGICAL) IMPLANT
NDL INSUFFLATION 14GA 120MM (NEEDLE) IMPLANT
NEEDLE INSUFFLATION 14GA 120MM (NEEDLE) IMPLANT
NS IRRIG 1000ML POUR BTL (IV SOLUTION) ×1 IMPLANT
PAD ARMBOARD POSITIONER FOAM (MISCELLANEOUS) ×1 IMPLANT
POUCH RETRIEVAL ECOSAC 10 (ENDOMECHANICALS) ×1 IMPLANT
SCISSORS LAP 5X35 DISP (ENDOMECHANICALS) ×1 IMPLANT
SET CHOLANGIOGRAPH MIX (MISCELLANEOUS) IMPLANT
SET TUBE SMOKE EVAC HIGH FLOW (TUBING) ×1 IMPLANT
SLEEVE ADV FIXATION 5X100MM (TROCAR) ×2 IMPLANT
SUT MNCRL AB 4-0 PS2 18 (SUTURE) ×1 IMPLANT
SUT VIC AB 0 UR5 27 (SUTURE) IMPLANT
SUT VICRYL 0 UR6 27IN ABS (SUTURE) ×1 IMPLANT
SYSTEM BAG RETRIEVAL 10MM (BASKET) IMPLANT
TRAY LAPAROSCOPIC (CUSTOM PROCEDURE TRAY) ×1 IMPLANT
TROCAR ADV FIXATION 5X100MM (TROCAR) ×1 IMPLANT
TROCAR BALLN 12MMX100 BLUNT (TROCAR) ×1 IMPLANT
TROCAR Z-THREAD FIOS 5X100MM (TROCAR) IMPLANT
WATER STERILE IRR 1000ML POUR (IV SOLUTION) ×1 IMPLANT

## 2024-07-17 NOTE — Plan of Care (Signed)

## 2024-07-17 NOTE — Anesthesia Postprocedure Evaluation (Signed)
 Anesthesia Post Note  Patient: Marisa Yu  Procedure(s) Performed: LAPAROSCOPIC CHOLECYSTECTOMY     Patient location during evaluation: PACU Anesthesia Type: General Level of consciousness: awake and alert Pain management: pain level controlled Vital Signs Assessment: post-procedure vital signs reviewed and stable Respiratory status: spontaneous breathing, nonlabored ventilation and respiratory function stable Cardiovascular status: blood pressure returned to baseline Postop Assessment: no apparent nausea or vomiting Anesthetic complications: no   No notable events documented.  Last Vitals:  Vitals:   07/17/24 1315 07/17/24 1330  BP: (!) 155/87 (!) 151/83  Pulse: (!) 104 93  Resp: 17 18  Temp:    SpO2: 99% 94%    Last Pain:  Vitals:   07/17/24 1330  TempSrc:   PainSc: Asleep                 Vertell Row

## 2024-07-17 NOTE — Progress Notes (Signed)
   07/17/24 1510  TOC Brief Assessment  Insurance and Status Lapsed  Patient has primary care physician Yes  Home environment has been reviewed home  Prior level of function: independent  Prior/Current Home Services No current home services  Social Drivers of Health Review SDOH reviewed no interventions necessary  Readmission risk has been reviewed Yes  Transition of care needs no transition of care needs at this time

## 2024-07-17 NOTE — Op Note (Signed)
   Operative Note  Date: 07/17/2024  Procedure: laparoscopic cholecystectomy  Pre-op diagnosis: symptomatic cholelithiasis Post-op diagnosis: symptomatic cholelithiasis, chronic cholecystitis, Fitz-Hugh Curtis adhesions  Indication and clinical history: The patient is a 38 y.o. year old female with symptomatic cholelithiasis  Surgeon: Dreama GEANNIE Hanger, MD  Anesthesiologist: Leopoldo, MD Anesthesia: General  Findings:  Specimen: gallbladder EBL: <5cc Drains/Implants: none  Disposition: PACU - hemodynamically stable.  Description of procedure: The patient was positioned supine on the operating room table. Time-out was performed verifying correct patient, procedure, signature of informed consent, and administration of pre-operative antibiotics. General anesthetic induction and intubation were uneventful. The abdomen was prepped and draped in the usual sterile fashion. An infra-umbilical incision was made using an open technique using zero vicryl stay sutures on either side of the fascia and a 10mm Hassan port inserted. After establishing pneumoperitoneum, which the patient tolerated well, the abdominal cavity was inspected and no injury of any intra-abdominal structures was identified. Additional ports were placed under direct visualization and using local anesthetic: two 5mm ports in the right subcostal region and a 5mm port in the epigastric region. The patient was re-positioned to reverse Trendelenburg and right side up. Adhesiolysis was performed to expose the gallbladder, which was then retracted cephalad. The infundibulum was identified and retracted toward the right lower quadrant. The peritoneum was incised over the infundibulum and the triangle of Calot dissected to expose the critical view of safety. With clear identification and isolation of the cystic duct and cystic artery, the cystic artery was doubly clipped and divided. After this, the cystic duct was identified as a single  structure entering the gallbladder, and was also doubly clipped and divided. The gallbladder was dissected off the liver bed using electrocautery and hemostasis of the liver bed was confirmed prior to separation of the final peritoneal attachments of the gallbladder to the liver bed. The gallbladder fossa was irrigated and fluid returned clear. After transection of the final peritoneal attachments, the gallbladder was placed in an endoscopic specimen retrieval bag, removed via the umbilical port site, and sent to pathology as a permanent specimen. The gallbladder fossa was inspected confirming hemostasis, the absence of bile leakage from the cystic duct stump, and correct placement of clips on the cystic artery and cystic duct stumps. The abdomen was desufflated and the fascia of the umbilical port site was closed using the previously placed stay sutures. Additional local anesthetic was administered at the umbilical port site.  The skin of all incisions was closed with 4-0 monocryl. Sterile dressings were applied. All sponge and instrument counts were correct at the conclusion of the procedure. The patient was awakened from anesthesia, extubated uneventfully, and transported to the PACU - hemodynamically stable.. There were no complications.    Dreama GEANNIE Hanger, MD General and Trauma Surgery [facility]

## 2024-07-17 NOTE — Anesthesia Preprocedure Evaluation (Addendum)
 Anesthesia Evaluation  Patient identified by MRN, date of birth, ID band Patient awake    Reviewed: Allergy & Precautions, NPO status , Patient's Chart, lab work & pertinent test results  History of Anesthesia Complications Negative for: history of anesthetic complications  Airway Mallampati: III  TM Distance: >3 FB Neck ROM: Full    Dental  (+) Dental Advisory Given,    Pulmonary neg shortness of breath, neg sleep apnea, neg COPD, neg recent URI, former smoker   breath sounds clear to auscultation       Cardiovascular hypertension, Pt. on medications (-) angina (-) Past MI and (-) CHF  Rhythm:Regular     Neuro/Psych  Headaches    GI/Hepatic Neg liver ROS,GERD  ,,  Endo/Other    Class 3 obesity  Renal/GU Lab Results      Component                Value               Date                      NA                       137                 07/16/2024                K                        3.6                 07/16/2024                CO2                      23                  07/16/2024                GLUCOSE                  128 (H)             07/16/2024                BUN                      8                   07/16/2024                CREATININE               0.74                07/16/2024                CALCIUM                  9.2                 07/16/2024                EGFR                     110  07/21/2023                GFRNONAA                 >60                 07/16/2024                Musculoskeletal   Abdominal   Peds  Hematology negative hematology ROS (+) Lab Results      Component                Value               Date                      WBC                      4.6                 07/16/2024                HGB                      13.0                07/16/2024                HCT                      41.1                07/16/2024                MCV                       84.9                07/16/2024                PLT                      312                 07/16/2024              Anesthesia Other Findings   Reproductive/Obstetrics                              Anesthesia Physical Anesthesia Plan  ASA: 3  Anesthesia Plan: General   Post-op Pain Management: Ofirmev  IV (intra-op)* and Toradol  IV (intra-op)*   Induction: Intravenous  PONV Risk Score and Plan: 4 or greater and Ondansetron  and Dexamethasone   Airway Management Planned: Oral ETT  Additional Equipment: None  Intra-op Plan:   Post-operative Plan: Extubation in OR  Informed Consent: I have reviewed the patients History and Physical, chart, labs and discussed the procedure including the risks, benefits and alternatives for the proposed anesthesia with the patient or authorized representative who has indicated his/her understanding and acceptance.     Dental advisory given  Plan Discussed with: CRNA  Anesthesia Plan Comments:          Anesthesia Quick Evaluation

## 2024-07-17 NOTE — Progress Notes (Signed)
 Attempted to ambulate pt upon return to floor. Pt able to ambulate ~54ft to BR with no issues.  Pt became dizzy and unsteady while walking in hallway. Returned pt to recliner with call bell in reach.

## 2024-07-17 NOTE — Transfer of Care (Signed)
 Immediate Anesthesia Transfer of Care Note  Patient: Marisa Yu  Procedure(s) Performed: LAPAROSCOPIC CHOLECYSTECTOMY  Patient Location: PACU  Anesthesia Type:General  Level of Consciousness: awake  Airway & Oxygen Therapy: Patient Spontanous Breathing and Patient connected to face mask oxygen  Post-op Assessment: Report given to RN and Post -op Vital signs reviewed and stable  Post vital signs: Reviewed and stable  Last Vitals:  Vitals Value Taken Time  BP    Temp    Pulse 93 07/17/24 12:20  Resp 23 07/17/24 12:20  SpO2 98 % 07/17/24 12:20  Vitals shown include unfiled device data.  Last Pain:  Vitals:   07/17/24 0932  TempSrc:   PainSc: 8       Patients Stated Pain Goal: 2 (07/17/24 0932)  Complications: No notable events documented.

## 2024-07-17 NOTE — Anesthesia Procedure Notes (Signed)
 Procedure Name: Intubation Date/Time: 07/17/2024 11:07 AM  Performed by: Cena Epps, CRNAPre-anesthesia Checklist: Patient identified, Emergency Drugs available, Suction available and Patient being monitored Patient Re-evaluated:Patient Re-evaluated prior to induction Oxygen Delivery Method: Circle System Utilized Preoxygenation: Pre-oxygenation with 100% oxygen Induction Type: IV induction Ventilation: Mask ventilation without difficulty Laryngoscope Size: Mac and 3 Grade View: Grade I Tube type: Oral Tube size: 7.0 mm Number of attempts: 1 Airway Equipment and Method: Stylet and Oral airway Placement Confirmation: ETT inserted through vocal cords under direct vision, positive ETCO2 and breath sounds checked- equal and bilateral Secured at: 23 cm Tube secured with: Tape Dental Injury: Teeth and Oropharynx as per pre-operative assessment

## 2024-07-17 NOTE — Progress Notes (Signed)
   General Surgery Follow Up Note  Subjective:    Overnight Issues:   Objective:  Vital signs for last 24 hours: Temp:  [97.4 F (36.3 C)-99 F (37.2 C)] 99 F (37.2 C) (10/27 0616) Pulse Rate:  [66-81] 73 (10/27 0616) Resp:  [17-20] 19 (10/27 0616) BP: (128-162)/(76-99) 153/99 (10/27 0616) SpO2:  [98 %-100 %] 100 % (10/27 0616) Weight:  [121.1 kg] 121.1 kg (10/26 1833)  Hemodynamic parameters for last 24 hours:    Intake/Output from previous day: 10/26 0701 - 10/27 0700 In: 2352.1 [P.O.:240; I.V.:1068; IV Piggyback:1044.1] Out: -   Intake/Output this shift: No intake/output data recorded.  Vent settings for last 24 hours:    Physical Exam:  Gen: comfortable, no distress Neuro: follows commands, alert, communicative HEENT: PERRL Neck: supple CV: RRR Pulm: unlabored breathing on RA Abd: soft, NT   GU: urine clear and yellow, +spontaneous void Extr: wwp, no edema  Results for orders placed or performed during the hospital encounter of 07/16/24 (from the past 24 hours)  Urine Drug Screen     Status: None   Collection Time: 07/16/24 10:37 AM  Result Value Ref Range   Opiates NEGATIVE NEGATIVE   Cocaine NEGATIVE NEGATIVE   Benzodiazepines NEGATIVE NEGATIVE   Amphetamines NEGATIVE NEGATIVE   Tetrahydrocannabinol NEGATIVE NEGATIVE   Barbiturates NEGATIVE NEGATIVE   Methadone Scn, Ur NEGATIVE NEGATIVE   Fentanyl  NEGATIVE NEGATIVE    Assessment & Plan:  Present on Admission:  Gallstones    LOS: 0 days   Additional comments:I reviewed the patient's new clinical lab test results.   and I reviewed the patients new imaging test results.    Symptomatic cholelithiasis - plan lap chole. Informed consent was obtained after detailed explanation of risks, including bleeding, infection, biloma, hematoma, injury to common bile duct, need for IOC to delineate anatomy, and need for conversion to open procedure. All questions answered to the patient's satisfaction. FEN -  NPO except sips/chips DVT - SCDs, LMWH Dispo - med-surg    Dreama GEANNIE Hanger, MD Trauma & General Surgery Please use [email or web address] to contact on call provider  07/17/2024  *Care during the described time interval was provided by me. I have reviewed this patient's available data, including medical history, events of note, physical examination and test results as part of my evaluation.

## 2024-07-18 ENCOUNTER — Other Ambulatory Visit (HOSPITAL_COMMUNITY): Payer: Self-pay

## 2024-07-18 ENCOUNTER — Encounter (HOSPITAL_COMMUNITY): Payer: Self-pay | Admitting: Surgery

## 2024-07-18 NOTE — Progress Notes (Signed)
 Patient discharged home, IV removed, discharge paperwork provided and explained, patient verbalized understanding.

## 2024-07-18 NOTE — Discharge Summary (Signed)
 [facility] Discharge Summary   Patient ID: Marisa Yu MRN: [id number] DOB/AGE: 38-Jan-1987 38 y.o.  Admit date: 07/16/2024 Discharge date: 07/18/2024  Admitting Diagnosis: Symptomatic cholelithiasis   Discharge Diagnosis Symptomatic cholelithiasis Chronic cholecystitis  Fitz-Hugh Curtis adhesions S/P laparoscopic cholecystectomy  Consultants None   Imaging: US  Abdomen Limited RUQ (LIVER/GB) Result Date: 07/16/2024 EXAM: Right Upper Quadrant Abdominal Ultrasound TECHNIQUE: Real-time ultrasonography of the right upper quadrant of the abdomen was performed. COMPARISON: None. CLINICAL HISTORY: Abdominal pain. FINDINGS: LIVER: The liver demonstrates normal echogenicity. No intrahepatic biliary ductal dilatation. No mass. BILIARY SYSTEM: The gallbladder lumen is filled with echogenic gallstones. Gallstones range from 10 to 14 mm. Positive sonographic Murphy sign. No evidence of pericholecystic fluid or wall thickening. Common bile duct is dilated to 9 mm. No choledocholithiasis identified on CT exam. RIGHT KIDNEY: The right kidney is grossly unremarkable in appearances without evidence of hydronephrosis, echogenic calculi or worrisome mass lesions. PANCREAS: Visualized portions of the pancreas are unremarkable. OTHER: No right upper quadrant ascites. IMPRESSION: 1. Ultrasound findings concerning for acute cholecystitis. Recommend clinical correlation 2. Dilatation of the proximal common bile duct. No choledocholithiasis identified on CT exam. Electronically signed by: Norleen Boxer MD 07/16/2024 12:17 PM EDT RP Workstation: [id number]   CT ABDOMEN PELVIS W CONTRAST Result Date: 07/16/2024 EXAM: CT ABDOMEN AND PELVIS WITH CONTRAST 07/16/2024 10:05:00 AM TECHNIQUE: CT of the abdomen and pelvis was performed with the administration of 100 mL of iohexol (OMNIPAQUE) 300 MG/ML solution. Multiplanar reformatted images are provided for review. Automated exposure control,  iterative reconstruction, and/or weight-based adjustment of the mA/kV was utilized to reduce the radiation dose to as low as reasonably achievable. COMPARISON: None available. CLINICAL HISTORY: 38 year old female with acute nonlocalized abdominal pain, mid abdominal pain started today, emesis, and shortness of breath. FINDINGS: LOWER CHEST: Low lung volumes. Mild lung base atelectasis. Heart size upper limits of normal. LIVER: No intrahepatic biliary ductal dilatation. GALLBLADDER AND BILE DUCTS: Extensive cholelithiasis. Individual stones up to 12 mm. The gallbladder appears mildly distended, but no surrounding inflammation by CT. There is a stone identified lodged in the neck of the gallbladder on both series 2 image 18 and coronal image 90. That stone measures 11 mm. There is associated common bile duct (CBD) dilatation 9 mm. SPLEEN: No acute abnormality. PANCREAS: No acute abnormality. ADRENAL GLANDS: No acute abnormality. KIDNEYS, URETERS AND BLADDER: No stones in the kidneys or ureters. No hydronephrosis. No perinephric or periureteral stranding. Urinary bladder is unremarkable. GI AND BOWEL: Stomach demonstrates no acute abnormality. Redundant large bowel. Normal appendix on coronal image 79. There is no bowel obstruction. PERITONEUM AND RETROPERITONEUM: Small volume of free fluid is mildly complex but primarily in the cul de sac and is probably physiologic. No free air. VASCULATURE: Aorta is normal in caliber. LYMPH NODES: No lymphadenopathy. REPRODUCTIVE ORGANS: Uterus and adnexa within normal limits. BONES AND SOFT TISSUES: Pelvic phleboliths. No acute osseous abnormality. No focal soft tissue abnormality. IMPRESSION: 1. Extensive cholithiasis with a gallbladder neck stone (11 mm) and common bile duct dilatation (9 mm). Consider developing acute cholecystitis and/or Mirizzi syndrome. Electronically signed by: Helayne Hurst MD 07/16/2024 10:37 AM EDT RP Workstation: [id number]    Procedures Dr. Dreama Hanger (07/17/24) - Laparoscopic Cholecystectomy   Hospital Course:  Patient is a 38 year old female who presented to the ED with abdominal pain.  Workup showed symptomatic cholelithiasis.  Patient was admitted and underwent procedure listed above.  Tolerated procedure well and was transferred to the floor.  Diet was advanced as tolerated.  On POD1, the patient was voiding well, tolerating diet, ambulating well, pain well controlled, vital signs stable, incisions c/d/i and felt stable for discharge home.  Patient will follow up in our office in 3-4 weeks and knows to call with questions or concerns.   Physical Exam: General:  Alert, NAD, pleasant, comfortable Abd:  Soft, ND, mild tenderness, incisions C/D/I   I or a member of my team have reviewed this patient in the Controlled Substance Database.   Allergies as of 07/18/2024       Reactions   Codeine Itching        Medication List     TAKE these medications    acetaminophen  500 MG tablet Commonly known as: TYLENOL  Take 2 tablets (1,000 mg total) by mouth in the morning, at noon, in the evening, and at bedtime.   ibuprofen  600 MG tablet Commonly known as: ADVIL  Take 1 tablet (600 mg total) by mouth 4 (four) times daily.   losartan -hydrochlorothiazide 100-25 MG tablet Commonly known as: HYZAAR TAKE 1 TABLET BY MOUTH EVERY DAY   methocarbamol  750 MG tablet Commonly known as: ROBAXIN  Take 1 tablet (750 mg total) by mouth 4 (four) times daily.   oxyCODONE  5 MG immediate release tablet Commonly known as: Roxicodone  Take 1 tablet (5 mg total) by mouth every 4 (four) hours as needed.   polyethylene glycol 17 g packet Commonly known as: MiraLax Take 17 g by mouth daily.          Follow-up Information     Maczis, Puja Gosai, PA-C. Go on 08/10/2024.   Specialty: General Surgery Why: 2 PM, please arrive 30 min prior to appointment time to check in. Contact information: [address] [place] [address] [facility] [place] [place] [address] [phone]                 Signed: Burnard JONELLE Louder , [facility] 07/18/2024, 8:43 AM Please see Amion for pager number during day hours 7:00am-4:30pm

## 2024-07-21 LAB — SURGICAL PATHOLOGY

## 2024-09-18 ENCOUNTER — Other Ambulatory Visit (HOSPITAL_BASED_OUTPATIENT_CLINIC_OR_DEPARTMENT_OTHER): Payer: Self-pay

## 2024-09-18 ENCOUNTER — Other Ambulatory Visit: Payer: Self-pay

## 2024-09-18 ENCOUNTER — Encounter (HOSPITAL_BASED_OUTPATIENT_CLINIC_OR_DEPARTMENT_OTHER): Payer: Self-pay

## 2024-09-18 ENCOUNTER — Emergency Department (HOSPITAL_BASED_OUTPATIENT_CLINIC_OR_DEPARTMENT_OTHER)
Admission: EM | Admit: 2024-09-18 | Discharge: 2024-09-18 | Disposition: A | Discharge status: Home / Self Care | Payer: Self-pay | Attending: Emergency Medicine | Admitting: Emergency Medicine

## 2024-09-18 DIAGNOSIS — J069 Acute upper respiratory infection, unspecified: Secondary | ICD-10-CM

## 2024-09-18 DIAGNOSIS — J101 Influenza due to other identified influenza virus with other respiratory manifestations: Secondary | ICD-10-CM | POA: Insufficient documentation

## 2024-09-18 LAB — RESP PANEL BY RT-PCR (RSV, FLU A&B, COVID)  RVPGX2
Influenza A by PCR: POSITIVE — AB
Influenza B by PCR: NEGATIVE
Resp Syncytial Virus by PCR: NEGATIVE
SARS Coronavirus 2 by RT PCR: NEGATIVE

## 2024-09-18 MED ORDER — AZITHROMYCIN 250 MG PO TABS
ORAL_TABLET | ORAL | 0 refills | Status: AC
  Filled 2024-09-18: qty 6, 5d supply, fill #0

## 2024-09-18 NOTE — ED Triage Notes (Signed)
 Reports cough, congestion, fatigue, fevers for 3 days.   Was around someone w similar symptoms on christmas

## 2024-09-18 NOTE — ED Provider Notes (Signed)
 " [facility] Provider Note   CSN: [id number] Arrival date & time: 09/18/24  9056     Patient presents with: Cough   Marisa Yu is a 38 y.o. female.   Flulike illness for the last several days with cough congestion low-grade fever.  Nothing makes it worse or better.  Denies any nausea vomiting diarrhea.  No major medical problems.  No chest pain shortness of breath.  Nasal congestion cough.  The history is provided by the patient.       Prior to Admission medications  Medication Sig Start Date End Date Taking? Authorizing Provider  azithromycin  (ZITHROMAX ) 250 MG tablet Take 1 tablet (250 mg total) by mouth daily. Take first 2 tablets together, then 1 every day until finished. 09/18/24  Yes Orla Jolliff, DO  acetaminophen  (TYLENOL ) 500 MG tablet Take 2 tablets (1,000 mg total) by mouth in the morning, at noon, in the evening, and at bedtime. 07/17/24 07/17/25  Paola Dreama SAILOR, MD  ibuprofen  (ADVIL ) 600 MG tablet Take 1 tablet (600 mg total) by mouth 4 (four) times daily. 07/17/24   Paola Dreama SAILOR, MD  losartan -hydrochlorothiazide (HYZAAR) 100-25 MG tablet TAKE 1 TABLET BY MOUTH EVERY DAY 06/27/24   Tanda Bleacher, MD  methocarbamol  (ROBAXIN ) 750 MG tablet Take 1 tablet (750 mg total) by mouth 4 (four) times daily. 07/17/24   Paola Dreama SAILOR, MD  oxyCODONE  (ROXICODONE ) 5 MG immediate release tablet Take 1 tablet (5 mg total) by mouth every 4 (four) hours as needed. 07/17/24 07/17/25  Paola Dreama SAILOR, MD  polyethylene glycol (MIRALAX ) 17 g packet Take 17 g by mouth daily. 07/17/24   Paola Dreama SAILOR, MD    Allergies: Codeine    Review of Systems  Updated Vital Signs BP (!) 154/96   Pulse (!) 113   Temp 100 F (37.8 C)   Resp 20   LMP 09/13/2024   SpO2 98%   Physical Exam Vitals and nursing note reviewed.  Constitutional:      General: She is not in acute distress.    Appearance: She is well-developed. She is  not ill-appearing.  HENT:     Head: Normocephalic and atraumatic.     Nose: Nose normal.     Mouth/Throat:     Mouth: Mucous membranes are moist.  Eyes:     Extraocular Movements: Extraocular movements intact.     Conjunctiva/sclera: Conjunctivae normal.     Pupils: Pupils are equal, round, and reactive to light.  Cardiovascular:     Rate and Rhythm: Normal rate and regular rhythm.     Pulses: Normal pulses.     Heart sounds: Normal heart sounds. No murmur heard. Pulmonary:     Effort: Pulmonary effort is normal. No respiratory distress.     Breath sounds: Normal breath sounds.  Abdominal:     General: Abdomen is flat.     Palpations: Abdomen is soft.     Tenderness: There is no abdominal tenderness.  Musculoskeletal:        General: No swelling.     Cervical back: Normal range of motion and neck supple.  Skin:    General: Skin is warm and dry.     Capillary Refill: Capillary refill takes less than 2 seconds.  Neurological:     General: No focal deficit present.     Mental Status: She is alert.  Psychiatric:        Mood and Affect: Mood normal.     (  all labs ordered are listed, but only abnormal results are displayed) Labs Reviewed  RESP PANEL BY RT-PCR (RSV, FLU A&B, COVID)  RVPGX2 - Abnormal; Notable for the following components:      Result Value   Influenza A by PCR POSITIVE (*)    All other components within normal limits    EKG: None  Radiology: No results found.   Procedures   Medications Ordered in the ED - No data to display                                  Medical Decision Making Risk Prescription drug management.   [name] is here with flulike symptoms.  Positive for influenza.  Continue Tylenol  ibuprofen  at home.  She is well-appearing.  Will prescribe Z-Pak in case there is bronchitis.  She does have a harsh cough on exam.  But clear breath sounds.  I have no concern that there is large pneumonia or any other acute process.  She  still fairly early in her disease process.  She understands return if symptoms worsen.  Continue supportive care with Tylenol  and ibuprofen  hydration at home.  Has not had any GI symptoms.  Discharge.  This chart was dictated using voice recognition software.  Despite best efforts to proofread,  errors can occur which can change the documentation meaning.      Final diagnoses:  Viral URI with cough  Influenza A    ED Discharge Orders          Ordered    azithromycin  (ZITHROMAX ) 250 MG tablet  Daily        09/18/24 1259               [place], DO 09/18/24 1300  "
